# Patient Record
Sex: Female | Born: 1945
Health system: Southern US, Community
[De-identification: ages and names within clinical notes are randomized; demographics above are authoritative.]

## PROBLEM LIST (undated history)

## (undated) DIAGNOSIS — R7301 Impaired fasting glucose: Secondary | ICD-10-CM

## (undated) DIAGNOSIS — Z9221 Personal history of antineoplastic chemotherapy: Secondary | ICD-10-CM

## (undated) DIAGNOSIS — Z803 Family history of malignant neoplasm of breast: Secondary | ICD-10-CM

## (undated) DIAGNOSIS — Z807 Family history of other malignant neoplasms of lymphoid, hematopoietic and related tissues: Secondary | ICD-10-CM

## (undated) DIAGNOSIS — I10 Essential (primary) hypertension: Secondary | ICD-10-CM

## (undated) DIAGNOSIS — Z808 Family history of malignant neoplasm of other organs or systems: Secondary | ICD-10-CM

## (undated) DIAGNOSIS — Z801 Family history of malignant neoplasm of trachea, bronchus and lung: Secondary | ICD-10-CM

## (undated) DIAGNOSIS — K219 Gastro-esophageal reflux disease without esophagitis: Secondary | ICD-10-CM

## (undated) DIAGNOSIS — E039 Hypothyroidism, unspecified: Secondary | ICD-10-CM

## (undated) DIAGNOSIS — E559 Vitamin D deficiency, unspecified: Secondary | ICD-10-CM

## (undated) DIAGNOSIS — L659 Nonscarring hair loss, unspecified: Secondary | ICD-10-CM

## (undated) DIAGNOSIS — C50919 Malignant neoplasm of unspecified site of unspecified female breast: Secondary | ICD-10-CM

## (undated) DIAGNOSIS — E782 Mixed hyperlipidemia: Secondary | ICD-10-CM

## (undated) DIAGNOSIS — Z800 Family history of malignant neoplasm of digestive organs: Secondary | ICD-10-CM

## (undated) DIAGNOSIS — Z923 Personal history of irradiation: Secondary | ICD-10-CM

## (undated) DIAGNOSIS — M5090 Cervical disc disorder, unspecified, unspecified cervical region: Secondary | ICD-10-CM

## (undated) HISTORY — DX: Vitamin D deficiency, unspecified: E55.9

## (undated) HISTORY — DX: Malignant neoplasm of unspecified site of unspecified female breast: C50.919

## (undated) HISTORY — DX: Family history of other malignant neoplasms of lymphoid, hematopoietic and related tissues: Z80.7

## (undated) HISTORY — DX: Cervical disc disorder, unspecified, unspecified cervical region: M50.90

## (undated) HISTORY — DX: Mixed hyperlipidemia: E78.2

## (undated) HISTORY — PX: OTHER SURGICAL HISTORY: SHX169

## (undated) HISTORY — DX: Family history of malignant neoplasm of breast: Z80.3

## (undated) HISTORY — DX: Family history of malignant neoplasm of trachea, bronchus and lung: Z80.1

## (undated) HISTORY — PX: RETINAL TEAR REPAIR CRYOTHERAPY: SHX5304

## (undated) HISTORY — DX: Nonscarring hair loss, unspecified: L65.9

## (undated) HISTORY — DX: Impaired fasting glucose: R73.01

## (undated) HISTORY — DX: Family history of malignant neoplasm of other organs or systems: Z80.8

## (undated) HISTORY — DX: Essential (primary) hypertension: I10

## (undated) HISTORY — PX: THYROIDECTOMY: SHX17

## (undated) HISTORY — DX: Family history of malignant neoplasm of digestive organs: Z80.0

---

## 1983-05-20 HISTORY — PX: BACK SURGERY: SHX140

## 2000-11-16 ENCOUNTER — Other Ambulatory Visit: Admission: RE | Admit: 2000-11-16 | Discharge: 2000-11-16 | Payer: Self-pay | Admitting: Obstetrics and Gynecology

## 2001-06-15 ENCOUNTER — Ambulatory Visit (HOSPITAL_COMMUNITY): Admission: RE | Admit: 2001-06-15 | Discharge: 2001-06-15 | Payer: Self-pay | Admitting: Pulmonary Disease

## 2001-12-15 ENCOUNTER — Other Ambulatory Visit: Admission: RE | Admit: 2001-12-15 | Discharge: 2001-12-15 | Payer: Self-pay | Admitting: Obstetrics and Gynecology

## 2002-06-29 ENCOUNTER — Ambulatory Visit (HOSPITAL_COMMUNITY): Admission: RE | Admit: 2002-06-29 | Discharge: 2002-06-29 | Payer: Self-pay | Admitting: Pulmonary Disease

## 2003-02-06 ENCOUNTER — Other Ambulatory Visit: Admission: RE | Admit: 2003-02-06 | Discharge: 2003-02-06 | Payer: Self-pay | Admitting: Obstetrics and Gynecology

## 2003-03-20 ENCOUNTER — Encounter (INDEPENDENT_AMBULATORY_CARE_PROVIDER_SITE_OTHER): Payer: Self-pay | Admitting: Specialist

## 2003-03-20 ENCOUNTER — Ambulatory Visit (HOSPITAL_COMMUNITY): Admission: RE | Admit: 2003-03-20 | Discharge: 2003-03-20 | Payer: Self-pay | Admitting: Urology

## 2003-03-20 ENCOUNTER — Ambulatory Visit (HOSPITAL_BASED_OUTPATIENT_CLINIC_OR_DEPARTMENT_OTHER): Admission: RE | Admit: 2003-03-20 | Discharge: 2003-03-20 | Payer: Self-pay | Admitting: Urology

## 2003-07-03 ENCOUNTER — Ambulatory Visit (HOSPITAL_COMMUNITY): Admission: RE | Admit: 2003-07-03 | Discharge: 2003-07-03 | Payer: Self-pay | Admitting: Internal Medicine

## 2004-01-23 ENCOUNTER — Emergency Department (HOSPITAL_COMMUNITY): Admission: EM | Admit: 2004-01-23 | Discharge: 2004-01-24 | Payer: Self-pay | Admitting: Emergency Medicine

## 2004-01-26 ENCOUNTER — Ambulatory Visit (HOSPITAL_COMMUNITY): Admission: RE | Admit: 2004-01-26 | Discharge: 2004-01-26 | Payer: Self-pay | Admitting: Internal Medicine

## 2004-08-13 ENCOUNTER — Ambulatory Visit (HOSPITAL_COMMUNITY): Admission: RE | Admit: 2004-08-13 | Discharge: 2004-08-13 | Payer: Self-pay | Admitting: Internal Medicine

## 2004-11-12 ENCOUNTER — Ambulatory Visit (HOSPITAL_COMMUNITY): Admission: RE | Admit: 2004-11-12 | Discharge: 2004-11-12 | Payer: Self-pay | Admitting: Internal Medicine

## 2004-11-22 ENCOUNTER — Ambulatory Visit (HOSPITAL_COMMUNITY): Admission: RE | Admit: 2004-11-22 | Discharge: 2004-11-22 | Payer: Self-pay | Admitting: Internal Medicine

## 2004-11-22 ENCOUNTER — Ambulatory Visit: Payer: Self-pay | Admitting: Internal Medicine

## 2005-08-15 ENCOUNTER — Ambulatory Visit (HOSPITAL_COMMUNITY): Admission: RE | Admit: 2005-08-15 | Discharge: 2005-08-15 | Payer: Self-pay | Admitting: Obstetrics and Gynecology

## 2006-08-18 ENCOUNTER — Ambulatory Visit (HOSPITAL_COMMUNITY): Admission: RE | Admit: 2006-08-18 | Discharge: 2006-08-18 | Payer: Self-pay | Admitting: Internal Medicine

## 2006-09-29 ENCOUNTER — Ambulatory Visit (HOSPITAL_COMMUNITY): Admission: RE | Admit: 2006-09-29 | Discharge: 2006-09-29 | Payer: Self-pay | Admitting: Ophthalmology

## 2006-11-10 ENCOUNTER — Ambulatory Visit (HOSPITAL_COMMUNITY): Admission: RE | Admit: 2006-11-10 | Discharge: 2006-11-10 | Payer: Self-pay | Admitting: Obstetrics and Gynecology

## 2007-04-28 ENCOUNTER — Ambulatory Visit (HOSPITAL_COMMUNITY): Admission: RE | Admit: 2007-04-28 | Discharge: 2007-04-28 | Payer: Self-pay | Admitting: Internal Medicine

## 2007-08-19 ENCOUNTER — Ambulatory Visit (HOSPITAL_COMMUNITY): Admission: RE | Admit: 2007-08-19 | Discharge: 2007-08-19 | Payer: Self-pay | Admitting: Internal Medicine

## 2008-03-31 ENCOUNTER — Ambulatory Visit (HOSPITAL_COMMUNITY): Admission: RE | Admit: 2008-03-31 | Discharge: 2008-03-31 | Payer: Self-pay | Admitting: Internal Medicine

## 2008-08-21 ENCOUNTER — Ambulatory Visit (HOSPITAL_COMMUNITY): Admission: RE | Admit: 2008-08-21 | Discharge: 2008-08-21 | Payer: Self-pay | Admitting: Internal Medicine

## 2009-08-23 ENCOUNTER — Ambulatory Visit (HOSPITAL_COMMUNITY): Admission: RE | Admit: 2009-08-23 | Discharge: 2009-08-23 | Payer: Self-pay | Admitting: Internal Medicine

## 2009-10-31 ENCOUNTER — Ambulatory Visit (HOSPITAL_COMMUNITY): Admission: RE | Admit: 2009-10-31 | Discharge: 2009-10-31 | Payer: Self-pay | Admitting: Internal Medicine

## 2009-11-13 ENCOUNTER — Encounter: Admission: RE | Admit: 2009-11-13 | Discharge: 2009-11-13 | Payer: Self-pay | Admitting: Neurosurgery

## 2010-06-08 ENCOUNTER — Encounter: Payer: Self-pay | Admitting: Internal Medicine

## 2010-07-30 ENCOUNTER — Other Ambulatory Visit (HOSPITAL_COMMUNITY): Payer: Self-pay | Admitting: Internal Medicine

## 2010-07-30 DIAGNOSIS — Z139 Encounter for screening, unspecified: Secondary | ICD-10-CM

## 2010-08-26 ENCOUNTER — Ambulatory Visit (HOSPITAL_COMMUNITY)
Admission: RE | Admit: 2010-08-26 | Discharge: 2010-08-26 | Disposition: A | Discharge status: Home / Self Care | Payer: Medicare Other | Source: Ambulatory Visit | Attending: Internal Medicine | Admitting: Internal Medicine

## 2010-08-26 DIAGNOSIS — Z139 Encounter for screening, unspecified: Secondary | ICD-10-CM

## 2010-08-26 DIAGNOSIS — Z1231 Encounter for screening mammogram for malignant neoplasm of breast: Secondary | ICD-10-CM | POA: Insufficient documentation

## 2010-08-28 ENCOUNTER — Ambulatory Visit
Admission: RE | Admit: 2010-08-28 | Discharge: 2010-08-28 | Disposition: A | Discharge status: Home / Self Care | Payer: Medicare Other | Source: Ambulatory Visit | Attending: Internal Medicine | Admitting: Internal Medicine

## 2010-08-28 ENCOUNTER — Other Ambulatory Visit (HOSPITAL_COMMUNITY): Payer: Self-pay | Admitting: Internal Medicine

## 2010-08-28 DIAGNOSIS — R921 Mammographic calcification found on diagnostic imaging of breast: Secondary | ICD-10-CM

## 2010-10-04 NOTE — Op Note (Signed)
NAME:  Carol Byrd, Carol Byrd                       ACCOUNT NO.:  [id number]   MEDICAL RECORD NO.:  [id number]                   PATIENT TYPE:  AMB   LOCATION:  [facility]                                 FACILITY:  [facility]   PHYSICIAN:  Sigmund I. Patsi Sears, M.D.         DATE OF BIRTH:  1945/11/22   DATE OF PROCEDURE:  03/20/2003  DATE OF DISCHARGE:                                 OPERATIVE REPORT   PREOPERATIVE DIAGNOSIS:  Stress urinary incontinence.   POSTOPERATIVE DIAGNOSIS:  Stress urinary incontinence.   OPERATION:  Transobturator pubovaginal sling.   SURGEON:  Sigmund I. Patsi Sears, M.D.   ANESTHESIA:  General LMA.   PREPARATION:  After appropriate preanesthesia, the patient is brought to the  operating room and placed on the operating table in a dorsal supine  position, where general LMA anesthesia was introduced.  She was then  replaced in the dorsal lithotomy position, where the pubis was prepped with  Betadine solution and draped in the usual fashion.   PROCEDURE:  With the patient in the lithotomy position, inspection reveals  that the patient had reasonable posterior support of her pelvic structures.  It was elected therefore to place a transobturator pubovaginal sling for  repair of her stress urinary incontinence.  Marcaine 0.25% plain 10 mL was  injected into the transvaginal pubocervical arch tissue, and a 1.5 cm  incision was made in the midline over the urethra.  Subcutaneous tissue was  then dissected bilaterally, and with sharp and blunt dissection the  transobturator space was identified.  A stab wound was made over the  obturator bone and using the transobturator tape instrumentation, the tape  was placed bilaterally.  Cystoscopy revealed no evidence of tape in the  bladder.  Cystoscopy did, however, reveal an abnormal area of the bladder,  which was felt to probably be an area of catheter irritation.  This was  biopsied as a cold cup biopsy to be sure that it was  not an endometrioma.  This area was cauterized as well.  Following this the bladder was again  drained with a Foley catheter.  The wings of the transobturator tape were  cut with a right angle clamp behind the tape to ensure that the tape would  not be too tight.  Following this, the wound was closed in two layers with 2-  0 Vicryl suture.  A Foley catheter and packing were placed, and the patient  was awakened and taken to the recovery room in good condition.  She had a  B&O suppository, given IV Toradol, and covered with antibiotic.  The packing  and Foley catheter will be removed before she goes home.                                               Sigmund I. Patsi Sears, M.D.  SIT/MEDQ  D:  03/20/2003  T:  03/20/2003  Job:  [id number]

## 2010-10-04 NOTE — Op Note (Signed)
NAME:  Carol Byrd, Carol Byrd             ACCOUNT NO.:  [id number]   MEDICAL RECORD NO.:  [id number]          PATIENT TYPE:  AMB   LOCATION:  DAY                           FACILITY:  [facility]   PHYSICIAN:  Lionel December, M.D.    DATE OF BIRTH:  [birth year]   DATE OF PROCEDURE:  11/22/2004  DATE OF DISCHARGE:                                 OPERATIVE REPORT   PROCEDURE:  Colonoscopy.   INDICATIONS:  [name] is 65 year old Caucasian female who is here for screening  colonoscopy. Family history is negative for colorectal carcinoma. Procedures  were reviewed the patient, informed consent was obtained.   PREMEDICATION:  Demerol 50 milligrams IV, Versed 7 milligrams IV in divided  dose.   FINDINGS:  Procedure performed in endoscopy suite. The patient's vital signs  and O2 sat were monitored during procedure and remained stable. The patient  was placed left lateral position and rectal examination performed. No  abnormality noted on external or digital exam. Olympus videoscope was placed  rectum, advanced under vision into sigmoid colon beyond. Preparation was  satisfactory. Redundant colon. Scope was slowly and carefully advanced  finally into the cecum. Cecal landmarks i.e. appendiceal orifice, ileocecal  valve well seen. Pictures taken for the record. Scattered diverticula noted  throughout the colon. There were no polyps and/or tumor masses. As the scope  was withdrawn, colonic mucosa was examined for the second time and there no  polyps and/or tumor masses. Rectal mucosa similarly was normal. Scope was  retroflexed to examine anorectal junction which was unremarkable. Endoscope  was straightened, withdrawn. The patient tolerated the procedure well.   FINAL DIAGNOSIS:  Pan colonic diverticulosis. She has few scattered  diverticula scattered throughout the colon.   RECOMMENDATIONS:  High-fiber diet. Citrucel or equivalent one tablespoonful  q.d.  She should continue yearly Hemoccults and consider  next reexamine in  10 years from now.       NR/MEDQ  D:  11/22/2004  T:  11/22/2004  Job:  [id number]   cc:   Kingsley Callander. Ouida Sills, MD  [address]  [place]  [place] [address]  Fax: [phone]

## 2011-03-12 ENCOUNTER — Ambulatory Visit (HOSPITAL_COMMUNITY)
Admission: RE | Admit: 2011-03-12 | Discharge: 2011-03-12 | Disposition: A | Discharge status: Home / Self Care | Payer: Medicare Other | Source: Ambulatory Visit | Attending: Internal Medicine | Admitting: Internal Medicine

## 2011-03-12 ENCOUNTER — Other Ambulatory Visit (HOSPITAL_COMMUNITY): Payer: Self-pay | Admitting: Internal Medicine

## 2011-03-12 DIAGNOSIS — M25579 Pain in unspecified ankle and joints of unspecified foot: Secondary | ICD-10-CM | POA: Insufficient documentation

## 2011-03-12 DIAGNOSIS — X500XXA Overexertion from strenuous movement or load, initial encounter: Secondary | ICD-10-CM | POA: Insufficient documentation

## 2011-03-12 DIAGNOSIS — S92309A Fracture of unspecified metatarsal bone(s), unspecified foot, initial encounter for closed fracture: Secondary | ICD-10-CM | POA: Insufficient documentation

## 2011-03-12 DIAGNOSIS — W19XXXA Unspecified fall, initial encounter: Secondary | ICD-10-CM

## 2011-03-13 ENCOUNTER — Ambulatory Visit (INDEPENDENT_AMBULATORY_CARE_PROVIDER_SITE_OTHER): Payer: Medicare Other | Admitting: Orthopedic Surgery

## 2011-03-13 ENCOUNTER — Encounter: Payer: Self-pay | Admitting: Orthopedic Surgery

## 2011-03-13 VITALS — BP 100/62 | Ht 67.0 in | Wt 170.0 lb

## 2011-03-13 DIAGNOSIS — S92309A Fracture of unspecified metatarsal bone(s), unspecified foot, initial encounter for closed fracture: Secondary | ICD-10-CM

## 2011-03-13 HISTORY — DX: Fracture of unspecified metatarsal bone(s), unspecified foot, initial encounter for closed fracture: S92.309A

## 2011-03-13 NOTE — Patient Instructions (Signed)
Shoe x 8 weeks

## 2011-03-13 NOTE — Progress Notes (Signed)
Pain left foot  Ref by Dr Jerene Pitch on deck c/o left foot pain and right knee pain. X Rays ankle : fracture foot-will need foot x rays   Review of systems are negative, except for easy bruising.  Past Surgical History  Procedure Date  . Thyroidectomy   . Cataracts   . Retinal tear repair cryotherapy   . Retinal peel    Past Medical History  Diagnosis Date  . High blood pressure    Physical Exam(12) GENERAL: normal development   CDV: pulses are normal   Skin: normal  Lymph: nodes were not palpable/normal  Psychiatric: awake, alert and oriented  Neuro: normal sensation  MSK walking in regular shoes she does have a limp  1 There is swelling and tenderness in the 5th metatarsal, the ankle joint is stable with normal range of motion. There is no deformity. Muscle tone is normal. Strength and stability are normal in the RIGHT knee. Swelling is noted in the RIGHT knee. Tenderness is noted over the patella.   2 views, LEFT foot, secondary to injury This is not a true avulsion fracture is more of a Jones fracture with a transverse fracture more distal on the bone, nondisplaced.  Impression Jones-type fracture, 5th metatarsal, LEFT foot Assessment: Jones fracture, LEFT 5th metatarsal    Plan: Hard sole shoe, patient advised fracture, takes 8 weeks to heal

## 2011-05-05 ENCOUNTER — Ambulatory Visit (INDEPENDENT_AMBULATORY_CARE_PROVIDER_SITE_OTHER): Payer: Medicare Other | Admitting: Orthopedic Surgery

## 2011-05-05 ENCOUNTER — Encounter: Payer: Self-pay | Admitting: Orthopedic Surgery

## 2011-05-05 VITALS — BP 110/70 | Ht 67.0 in | Wt 173.0 lb

## 2011-05-05 DIAGNOSIS — S92309A Fracture of unspecified metatarsal bone(s), unspecified foot, initial encounter for closed fracture: Secondary | ICD-10-CM

## 2011-05-05 NOTE — Patient Instructions (Signed)
Normal activity    

## 2011-05-05 NOTE — Progress Notes (Signed)
Subjective:     Patient ID: Carol Byrd, female   DOB: 1946-05-12, 65 y.o.   MRN: [id number]  HPI  Right 5th MMT fracture   8 weeks ago  Rx hardsole shoe   C/O stinging     Review of Systems  Negative other msk     Objective:   Physical Exam A good alignment normal, no fracture site tenderness  X-ray shows fibrous union non-displaced fifth metatarsal fracture at the base    Assessment:     Fifth metatarsal fracture with fibrous union    Plan:     Resume normal shoe wear  X-ray 3 views RIGHT foot reason for x-ray fracture RIGHT foot  Diagnosis fifth metatarsal fracture  At the base of the fifth metatarsal in the area of the Jones injury there is a fracture transverse with no displacement and apparent fibrous union at 8 weeks  Impression fibrous union fifth metatarsal fracture RIGHT foot

## 2011-05-08 ENCOUNTER — Ambulatory Visit: Payer: Medicare Other | Admitting: Orthopedic Surgery

## 2011-07-10 DIAGNOSIS — L570 Actinic keratosis: Secondary | ICD-10-CM | POA: Diagnosis not present

## 2011-07-10 DIAGNOSIS — L821 Other seborrheic keratosis: Secondary | ICD-10-CM | POA: Diagnosis not present

## 2011-07-10 DIAGNOSIS — L659 Nonscarring hair loss, unspecified: Secondary | ICD-10-CM | POA: Diagnosis not present

## 2011-07-10 DIAGNOSIS — D239 Other benign neoplasm of skin, unspecified: Secondary | ICD-10-CM | POA: Diagnosis not present

## 2011-07-31 DIAGNOSIS — H35379 Puckering of macula, unspecified eye: Secondary | ICD-10-CM | POA: Diagnosis not present

## 2011-07-31 DIAGNOSIS — H33009 Unspecified retinal detachment with retinal break, unspecified eye: Secondary | ICD-10-CM | POA: Diagnosis not present

## 2011-07-31 DIAGNOSIS — Z961 Presence of intraocular lens: Secondary | ICD-10-CM | POA: Diagnosis not present

## 2011-08-04 ENCOUNTER — Other Ambulatory Visit (HOSPITAL_COMMUNITY): Payer: Self-pay | Admitting: Internal Medicine

## 2011-08-04 DIAGNOSIS — R921 Mammographic calcification found on diagnostic imaging of breast: Secondary | ICD-10-CM

## 2011-09-01 ENCOUNTER — Ambulatory Visit (HOSPITAL_COMMUNITY)
Admission: RE | Admit: 2011-09-01 | Discharge: 2011-09-01 | Disposition: A | Discharge status: Home / Self Care | Payer: Medicare Other | Source: Ambulatory Visit | Attending: Internal Medicine | Admitting: Internal Medicine

## 2011-09-01 DIAGNOSIS — R921 Mammographic calcification found on diagnostic imaging of breast: Secondary | ICD-10-CM

## 2011-09-01 DIAGNOSIS — Z1231 Encounter for screening mammogram for malignant neoplasm of breast: Secondary | ICD-10-CM | POA: Diagnosis not present

## 2011-09-01 DIAGNOSIS — R928 Other abnormal and inconclusive findings on diagnostic imaging of breast: Secondary | ICD-10-CM | POA: Insufficient documentation

## 2011-10-30 DIAGNOSIS — Z79899 Other long term (current) drug therapy: Secondary | ICD-10-CM | POA: Diagnosis not present

## 2011-10-30 DIAGNOSIS — I10 Essential (primary) hypertension: Secondary | ICD-10-CM | POA: Diagnosis not present

## 2011-10-30 DIAGNOSIS — E039 Hypothyroidism, unspecified: Secondary | ICD-10-CM | POA: Diagnosis not present

## 2011-10-30 DIAGNOSIS — E785 Hyperlipidemia, unspecified: Secondary | ICD-10-CM | POA: Diagnosis not present

## 2011-11-06 DIAGNOSIS — E039 Hypothyroidism, unspecified: Secondary | ICD-10-CM | POA: Diagnosis not present

## 2011-11-06 DIAGNOSIS — I10 Essential (primary) hypertension: Secondary | ICD-10-CM | POA: Diagnosis not present

## 2011-11-06 DIAGNOSIS — E785 Hyperlipidemia, unspecified: Secondary | ICD-10-CM | POA: Diagnosis not present

## 2011-11-13 DIAGNOSIS — Z01419 Encounter for gynecological examination (general) (routine) without abnormal findings: Secondary | ICD-10-CM | POA: Diagnosis not present

## 2011-11-13 DIAGNOSIS — Z124 Encounter for screening for malignant neoplasm of cervix: Secondary | ICD-10-CM | POA: Diagnosis not present

## 2011-11-18 ENCOUNTER — Other Ambulatory Visit (HOSPITAL_COMMUNITY): Payer: Self-pay | Admitting: Obstetrics and Gynecology

## 2011-11-18 DIAGNOSIS — E039 Hypothyroidism, unspecified: Secondary | ICD-10-CM

## 2011-12-22 DIAGNOSIS — N3941 Urge incontinence: Secondary | ICD-10-CM | POA: Diagnosis not present

## 2011-12-22 DIAGNOSIS — N393 Stress incontinence (female) (male): Secondary | ICD-10-CM | POA: Diagnosis not present

## 2011-12-23 ENCOUNTER — Ambulatory Visit (HOSPITAL_COMMUNITY)
Admission: RE | Admit: 2011-12-23 | Discharge: 2011-12-23 | Disposition: A | Discharge status: Home / Self Care | Payer: Medicare Other | Source: Ambulatory Visit | Attending: Obstetrics and Gynecology | Admitting: Obstetrics and Gynecology

## 2011-12-23 DIAGNOSIS — Z780 Asymptomatic menopausal state: Secondary | ICD-10-CM | POA: Insufficient documentation

## 2011-12-23 DIAGNOSIS — Z13820 Encounter for screening for osteoporosis: Secondary | ICD-10-CM | POA: Diagnosis not present

## 2011-12-23 DIAGNOSIS — E039 Hypothyroidism, unspecified: Secondary | ICD-10-CM | POA: Insufficient documentation

## 2011-12-24 DIAGNOSIS — N3941 Urge incontinence: Secondary | ICD-10-CM | POA: Diagnosis not present

## 2011-12-24 DIAGNOSIS — N393 Stress incontinence (female) (male): Secondary | ICD-10-CM | POA: Diagnosis not present

## 2012-01-06 DIAGNOSIS — N3941 Urge incontinence: Secondary | ICD-10-CM | POA: Diagnosis not present

## 2012-01-06 DIAGNOSIS — N393 Stress incontinence (female) (male): Secondary | ICD-10-CM | POA: Diagnosis not present

## 2012-01-13 DIAGNOSIS — Z961 Presence of intraocular lens: Secondary | ICD-10-CM | POA: Diagnosis not present

## 2012-01-13 DIAGNOSIS — H35379 Puckering of macula, unspecified eye: Secondary | ICD-10-CM | POA: Diagnosis not present

## 2012-02-05 DIAGNOSIS — N393 Stress incontinence (female) (male): Secondary | ICD-10-CM | POA: Diagnosis not present

## 2012-02-05 DIAGNOSIS — N3941 Urge incontinence: Secondary | ICD-10-CM | POA: Diagnosis not present

## 2012-02-05 DIAGNOSIS — R279 Unspecified lack of coordination: Secondary | ICD-10-CM | POA: Diagnosis not present

## 2012-02-05 DIAGNOSIS — M6281 Muscle weakness (generalized): Secondary | ICD-10-CM | POA: Diagnosis not present

## 2012-02-12 DIAGNOSIS — R279 Unspecified lack of coordination: Secondary | ICD-10-CM | POA: Diagnosis not present

## 2012-02-12 DIAGNOSIS — M6281 Muscle weakness (generalized): Secondary | ICD-10-CM | POA: Diagnosis not present

## 2012-02-12 DIAGNOSIS — N3941 Urge incontinence: Secondary | ICD-10-CM | POA: Diagnosis not present

## 2012-02-12 DIAGNOSIS — N393 Stress incontinence (female) (male): Secondary | ICD-10-CM | POA: Diagnosis not present

## 2012-02-17 DIAGNOSIS — N393 Stress incontinence (female) (male): Secondary | ICD-10-CM | POA: Diagnosis not present

## 2012-02-17 DIAGNOSIS — M6281 Muscle weakness (generalized): Secondary | ICD-10-CM | POA: Diagnosis not present

## 2012-02-17 DIAGNOSIS — R279 Unspecified lack of coordination: Secondary | ICD-10-CM | POA: Diagnosis not present

## 2012-02-17 DIAGNOSIS — N3941 Urge incontinence: Secondary | ICD-10-CM | POA: Diagnosis not present

## 2012-02-24 DIAGNOSIS — R279 Unspecified lack of coordination: Secondary | ICD-10-CM | POA: Diagnosis not present

## 2012-02-24 DIAGNOSIS — N3941 Urge incontinence: Secondary | ICD-10-CM | POA: Diagnosis not present

## 2012-02-24 DIAGNOSIS — N393 Stress incontinence (female) (male): Secondary | ICD-10-CM | POA: Diagnosis not present

## 2012-02-24 DIAGNOSIS — M6281 Muscle weakness (generalized): Secondary | ICD-10-CM | POA: Diagnosis not present

## 2012-03-11 DIAGNOSIS — Z23 Encounter for immunization: Secondary | ICD-10-CM | POA: Diagnosis not present

## 2012-03-29 DIAGNOSIS — M545 Low back pain: Secondary | ICD-10-CM | POA: Diagnosis not present

## 2012-05-04 DIAGNOSIS — I10 Essential (primary) hypertension: Secondary | ICD-10-CM | POA: Diagnosis not present

## 2012-05-27 DIAGNOSIS — J019 Acute sinusitis, unspecified: Secondary | ICD-10-CM | POA: Diagnosis not present

## 2012-07-15 DIAGNOSIS — L659 Nonscarring hair loss, unspecified: Secondary | ICD-10-CM | POA: Diagnosis not present

## 2012-07-15 DIAGNOSIS — D1801 Hemangioma of skin and subcutaneous tissue: Secondary | ICD-10-CM | POA: Diagnosis not present

## 2012-07-15 DIAGNOSIS — L821 Other seborrheic keratosis: Secondary | ICD-10-CM | POA: Diagnosis not present

## 2012-07-15 DIAGNOSIS — Z85828 Personal history of other malignant neoplasm of skin: Secondary | ICD-10-CM | POA: Diagnosis not present

## 2012-08-02 ENCOUNTER — Other Ambulatory Visit (HOSPITAL_COMMUNITY): Payer: Self-pay | Admitting: Internal Medicine

## 2012-08-02 DIAGNOSIS — Z139 Encounter for screening, unspecified: Secondary | ICD-10-CM

## 2012-08-19 DIAGNOSIS — Z961 Presence of intraocular lens: Secondary | ICD-10-CM | POA: Diagnosis not present

## 2012-08-19 DIAGNOSIS — H35379 Puckering of macula, unspecified eye: Secondary | ICD-10-CM | POA: Diagnosis not present

## 2012-08-19 DIAGNOSIS — H33009 Unspecified retinal detachment with retinal break, unspecified eye: Secondary | ICD-10-CM | POA: Diagnosis not present

## 2012-09-02 ENCOUNTER — Ambulatory Visit (HOSPITAL_COMMUNITY)
Admission: RE | Admit: 2012-09-02 | Discharge: 2012-09-02 | Disposition: A | Discharge status: Home / Self Care | Payer: Medicare Other | Source: Ambulatory Visit | Attending: Internal Medicine | Admitting: Internal Medicine

## 2012-09-02 DIAGNOSIS — Z1231 Encounter for screening mammogram for malignant neoplasm of breast: Secondary | ICD-10-CM | POA: Insufficient documentation

## 2012-09-02 DIAGNOSIS — Z139 Encounter for screening, unspecified: Secondary | ICD-10-CM

## 2012-10-01 DIAGNOSIS — I10 Essential (primary) hypertension: Secondary | ICD-10-CM | POA: Diagnosis not present

## 2012-10-22 ENCOUNTER — Ambulatory Visit (INDEPENDENT_AMBULATORY_CARE_PROVIDER_SITE_OTHER): Payer: Medicare Other | Admitting: Diagnostic Neuroimaging

## 2012-10-22 ENCOUNTER — Encounter: Payer: Self-pay | Admitting: Diagnostic Neuroimaging

## 2012-10-22 VITALS — BP 151/70 | HR 82 | Temp 96.7°F | Ht 67.5 in | Wt 170.0 lb

## 2012-10-22 DIAGNOSIS — R251 Tremor, unspecified: Secondary | ICD-10-CM

## 2012-10-22 DIAGNOSIS — R259 Unspecified abnormal involuntary movements: Secondary | ICD-10-CM | POA: Diagnosis not present

## 2012-10-22 NOTE — Progress Notes (Signed)
[facility]  PATIENT: Carol Byrd DOB: 1946-03-05  REFERRING CLINICIAN: R Fagan HISTORY FROM: patient and husband REASON FOR VISIT: new consult   HISTORICAL  CHIEF COMPLAINT:  Chief Complaint  Patient presents with  . Tremors    Np.#6    HISTORY OF PRESENT ILLNESS:   67 year old right-handed female with history of Graves' disease status post thyroidectomy, hypertension, hypercholesterolemia, here for evaluation of left thumb tremor for past 3-4 months.  Patient notes tremor, jittery shaking movements in her left thumb when she flexes it into a certain position. She also is noted left thumb getting "stuck" if it is flexed. Patient has family history of Parkinson's disease in her mother, who was diagnosed at age 46 years old and passed away at age 68 years old. Patient's mother also had dementia. Patient is concerned about similar diagnosis for herself.  No problems in her right arm, legs, balance or walking. She has some trouble with swallowing, especially read or don't products, which are improved if she drinks water. No choking problems with dry crunchy foods or liquids. She has some trouble with insomnia, which is compounded by insomnia and her husband. No change in sense of smell.  REVIEW OF SYSTEMS: Full 14 system review of systems performed and notable only for trouble swallowing incontinence cramps feeling hot and cold insomnia.  ALLERGIES: No Known Allergies  HOME MEDICATIONS: Outpatient Prescriptions Prior to Visit  Medication Sig Dispense Refill  . levothyroxine (SYNTHROID, LEVOTHROID) 112 MCG tablet Take 112 mcg by mouth daily.        . simvastatin (ZOCOR) 40 MG tablet Take 40 mg by mouth at bedtime.        Marland Kitchen telmisartan (MICARDIS) 40 MG tablet Take 40 mg by mouth daily.         No facility-administered medications prior to visit.    PAST MEDICAL HISTORY: Past Medical History  Diagnosis Date  . High blood pressure   . High cholesterol       PAST SURGICAL HISTORY: Past Surgical History  Procedure Laterality Date  . Thyroidectomy    . Cataracts    . Retinal tear repair cryotherapy    . Retinal peel      FAMILY HISTORY: Family History  Problem Relation Age of Onset  . Heart disease    . Cancer    . Breast cancer Sister     SOCIAL HISTORY:  History   Social History  . Marital Status: Married    Spouse Name: N/A    Number of Children: N/A  . Years of Education: N/A   Occupational History  . retired    Social History Main Topics  . Smoking status: Never Smoker   . Smokeless tobacco: Not on file  . Alcohol Use: Yes  . Drug Use: No  . Sexually Active: Not on file   Other Topics Concern  . Not on file   Social History Narrative  . No narrative on file     PHYSICAL EXAM  Filed Vitals:   10/22/12 1136  BP: 151/70  Pulse: 82  Temp: 96.7 F (35.9 C)  TempSrc: Oral  Height: 5' 7.5" (1.715 m)  Weight: 170 lb (77.111 kg)    Not recorded    Body mass index is 26.22 kg/(m^2).  GENERAL EXAM: Patient is in no distress  CARDIOVASCULAR: Regular rate and rhythm, no murmurs, no carotid bruits  NEUROLOGIC: MENTAL STATUS: awake, alert, language fluent, comprehension intact, naming intact; NEG MYERSONS, NEG SNOUT.  CRANIAL NERVE:  no papilledema on fundoscopic exam, pupils equal and reactive to light, LEFT EYE POST-SURGICAL.  visual fields full to confrontation, extraocular muscles intact, no nystagmus, facial sensation and strength symmetric, uvula midline, shoulder shrug symmetric, tongue midline. MOTOR: normal bulk and tone, full strength in the BUE, BLE; NO REST TREMOR. INTERMITTENT POSTURAL TREMOR OF LEFT THUMB WITH 45 DEGREE FLEXION. MILD POSTURAL TREMOR OF BUE. NO RIGIDITY, BRADYKINESIA. SENSORY: normal and symmetric to light touch, pinprick, temperature, vibration COORDINATION: finger-nose-finger, fine finger movements normal REFLEXES: deep tendon reflexes present and symmetric GAIT/STATION:  narrow based gait; able to walk on toes, heels and tandem; romberg is negative   DIAGNOSTIC DATA (LABS, IMAGING, TESTING) - I reviewed patient records, labs, notes, testing and imaging myself where available.  No results found for this basename: WBC, HGB, HCT, MCV, PLT   No results found for this basename: na, k, cl, co2, glucose, bun, creatinine, calcium, prot, albumin, ast, alt, alkphos, bilitot, gfrnonaa, gfraa   No results found for this basename: CHOL, HDL, LDLCALC, LDLDIRECT, TRIG, CHOLHDL   No results found for this basename: HGBA1C   No results found for this basename: VITAMINB12   No results found for this basename: TSH      ASSESSMENT AND PLAN  67 y.o. year old female  has a past medical history of High blood pressure and High cholesterol. here with intermittent postural tremor of the left thumb. I do not see any signs of Parkinson's disease at this time. Tremor may be related to trigger finger and mechanical factors, essential tremor or enhanced physiologic tremor/anxiety.  PLAN: 1. Observation    Suanne Marker, MD 10/22/2012, 12:11 PM Certified in Neurology, Neurophysiology and Neuroimaging  [facility] [address], [address] [place], [place] [address] [phone]

## 2012-10-22 NOTE — Patient Instructions (Signed)
Observation. Follow up in 6-12 months.

## 2012-10-29 DIAGNOSIS — E785 Hyperlipidemia, unspecified: Secondary | ICD-10-CM | POA: Diagnosis not present

## 2012-10-29 DIAGNOSIS — R55 Syncope and collapse: Secondary | ICD-10-CM | POA: Diagnosis not present

## 2012-11-26 DIAGNOSIS — Z79899 Other long term (current) drug therapy: Secondary | ICD-10-CM | POA: Diagnosis not present

## 2012-11-26 DIAGNOSIS — I10 Essential (primary) hypertension: Secondary | ICD-10-CM | POA: Diagnosis not present

## 2012-11-26 DIAGNOSIS — R7301 Impaired fasting glucose: Secondary | ICD-10-CM | POA: Diagnosis not present

## 2012-11-26 DIAGNOSIS — E039 Hypothyroidism, unspecified: Secondary | ICD-10-CM | POA: Diagnosis not present

## 2012-11-26 DIAGNOSIS — E785 Hyperlipidemia, unspecified: Secondary | ICD-10-CM | POA: Diagnosis not present

## 2012-11-29 DIAGNOSIS — L659 Nonscarring hair loss, unspecified: Secondary | ICD-10-CM | POA: Diagnosis not present

## 2012-12-09 DIAGNOSIS — E039 Hypothyroidism, unspecified: Secondary | ICD-10-CM | POA: Diagnosis not present

## 2012-12-09 DIAGNOSIS — E785 Hyperlipidemia, unspecified: Secondary | ICD-10-CM | POA: Diagnosis not present

## 2012-12-09 DIAGNOSIS — I10 Essential (primary) hypertension: Secondary | ICD-10-CM | POA: Diagnosis not present

## 2012-12-16 DIAGNOSIS — Z01419 Encounter for gynecological examination (general) (routine) without abnormal findings: Secondary | ICD-10-CM | POA: Diagnosis not present

## 2012-12-16 DIAGNOSIS — Z124 Encounter for screening for malignant neoplasm of cervix: Secondary | ICD-10-CM | POA: Diagnosis not present

## 2012-12-28 DIAGNOSIS — R05 Cough: Secondary | ICD-10-CM | POA: Diagnosis not present

## 2012-12-29 ENCOUNTER — Ambulatory Visit (HOSPITAL_COMMUNITY)
Admission: RE | Admit: 2012-12-29 | Discharge: 2012-12-29 | Disposition: A | Discharge status: Home / Self Care | Payer: Medicare Other | Source: Ambulatory Visit | Attending: Internal Medicine | Admitting: Internal Medicine

## 2012-12-29 ENCOUNTER — Other Ambulatory Visit (HOSPITAL_COMMUNITY): Payer: Self-pay | Admitting: Internal Medicine

## 2012-12-29 DIAGNOSIS — R05 Cough: Secondary | ICD-10-CM | POA: Diagnosis not present

## 2012-12-29 DIAGNOSIS — J3489 Other specified disorders of nose and nasal sinuses: Secondary | ICD-10-CM | POA: Insufficient documentation

## 2012-12-29 DIAGNOSIS — R059 Cough, unspecified: Secondary | ICD-10-CM | POA: Insufficient documentation

## 2013-01-18 DIAGNOSIS — H35379 Puckering of macula, unspecified eye: Secondary | ICD-10-CM | POA: Diagnosis not present

## 2013-01-18 DIAGNOSIS — Z961 Presence of intraocular lens: Secondary | ICD-10-CM | POA: Diagnosis not present

## 2013-03-22 DIAGNOSIS — Z79899 Other long term (current) drug therapy: Secondary | ICD-10-CM | POA: Diagnosis not present

## 2013-03-22 DIAGNOSIS — E785 Hyperlipidemia, unspecified: Secondary | ICD-10-CM | POA: Diagnosis not present

## 2013-03-29 DIAGNOSIS — Z23 Encounter for immunization: Secondary | ICD-10-CM | POA: Diagnosis not present

## 2013-03-29 DIAGNOSIS — R05 Cough: Secondary | ICD-10-CM | POA: Diagnosis not present

## 2013-03-29 DIAGNOSIS — E785 Hyperlipidemia, unspecified: Secondary | ICD-10-CM | POA: Diagnosis not present

## 2013-06-07 DIAGNOSIS — K219 Gastro-esophageal reflux disease without esophagitis: Secondary | ICD-10-CM | POA: Diagnosis not present

## 2013-06-22 DIAGNOSIS — L659 Nonscarring hair loss, unspecified: Secondary | ICD-10-CM | POA: Diagnosis not present

## 2013-06-22 DIAGNOSIS — L570 Actinic keratosis: Secondary | ICD-10-CM | POA: Diagnosis not present

## 2013-06-22 DIAGNOSIS — B351 Tinea unguium: Secondary | ICD-10-CM | POA: Diagnosis not present

## 2013-07-25 ENCOUNTER — Other Ambulatory Visit (HOSPITAL_COMMUNITY): Payer: Self-pay | Admitting: Internal Medicine

## 2013-07-25 DIAGNOSIS — Z1231 Encounter for screening mammogram for malignant neoplasm of breast: Secondary | ICD-10-CM

## 2013-08-01 DIAGNOSIS — K219 Gastro-esophageal reflux disease without esophagitis: Secondary | ICD-10-CM | POA: Diagnosis not present

## 2013-08-12 DIAGNOSIS — K219 Gastro-esophageal reflux disease without esophagitis: Secondary | ICD-10-CM | POA: Diagnosis not present

## 2013-08-12 DIAGNOSIS — F458 Other somatoform disorders: Secondary | ICD-10-CM | POA: Diagnosis not present

## 2013-08-12 DIAGNOSIS — R059 Cough, unspecified: Secondary | ICD-10-CM | POA: Diagnosis not present

## 2013-08-12 DIAGNOSIS — R490 Dysphonia: Secondary | ICD-10-CM | POA: Diagnosis not present

## 2013-08-12 DIAGNOSIS — R05 Cough: Secondary | ICD-10-CM | POA: Diagnosis not present

## 2013-08-23 DIAGNOSIS — S058X9A Other injuries of unspecified eye and orbit, initial encounter: Secondary | ICD-10-CM | POA: Diagnosis not present

## 2013-09-05 ENCOUNTER — Ambulatory Visit (HOSPITAL_COMMUNITY)
Admission: RE | Admit: 2013-09-05 | Discharge: 2013-09-05 | Disposition: A | Discharge status: Home / Self Care | Payer: Medicare Other | Source: Ambulatory Visit | Attending: Internal Medicine | Admitting: Internal Medicine

## 2013-09-05 DIAGNOSIS — Z1231 Encounter for screening mammogram for malignant neoplasm of breast: Secondary | ICD-10-CM | POA: Insufficient documentation

## 2013-09-08 DIAGNOSIS — H35379 Puckering of macula, unspecified eye: Secondary | ICD-10-CM | POA: Diagnosis not present

## 2013-09-08 DIAGNOSIS — H33009 Unspecified retinal detachment with retinal break, unspecified eye: Secondary | ICD-10-CM | POA: Diagnosis not present

## 2013-09-08 DIAGNOSIS — Z961 Presence of intraocular lens: Secondary | ICD-10-CM | POA: Diagnosis not present

## 2013-09-09 DIAGNOSIS — S01501A Unspecified open wound of lip, initial encounter: Secondary | ICD-10-CM | POA: Diagnosis not present

## 2013-10-03 DIAGNOSIS — L509 Urticaria, unspecified: Secondary | ICD-10-CM | POA: Diagnosis not present

## 2013-10-04 ENCOUNTER — Ambulatory Visit (INDEPENDENT_AMBULATORY_CARE_PROVIDER_SITE_OTHER): Payer: Medicare Other | Admitting: Orthopedic Surgery

## 2013-10-04 DIAGNOSIS — M7660 Achilles tendinitis, unspecified leg: Secondary | ICD-10-CM | POA: Insufficient documentation

## 2013-10-04 HISTORY — DX: Achilles tendinitis, unspecified leg: M76.60

## 2013-10-04 NOTE — Patient Instructions (Signed)
Ice baths  Open back shoes  Topical cream

## 2013-10-04 NOTE — Progress Notes (Signed)
Patient ID: Carol Byrd, female   DOB: 1945-06-19, 68 y.o.   MRN: [id number]  Chief complaint pain and swelling posterior aspect right heel for 2 months  Patient presents with atraumatic onset of pain and swelling with dull constant discomfort in the posterior aspect of her right heel and difficulty wearing a shoe from the shoe counter. Took some ibuprofen and thought she might develop hives when on prednisone and she is also modified her shoe wear and thinks that things have gotten a little bit better. Denies any trauma.  Previously seen for other orthopedic issues related to her fracture and her other foot Past Medical History  Diagnosis Date  . High blood pressure   . High cholesterol     General appearance is normal, the patient is alert and oriented x3 with normal mood and affect. She walks normally she is wearing an open back shoe she is a swollen tender pump bump is nontender Achilles ankle range of motion normal stability tests were drawer test normal muscle tone and strength normal skin intact, swelling over the pump bump area pulses are good sensation is normal. No abnormal pathologic reflexes such as the Hoffman maneuver toes downgoing  Impression Achilles tendinitis pump bump  Recommend diclofenac Lidoderm menthol cream Ice bath Also recommend wearing open back shoe We'll see her in 6 weeks

## 2013-10-05 DIAGNOSIS — E785 Hyperlipidemia, unspecified: Secondary | ICD-10-CM | POA: Diagnosis not present

## 2013-10-11 DIAGNOSIS — E785 Hyperlipidemia, unspecified: Secondary | ICD-10-CM | POA: Diagnosis not present

## 2013-11-15 ENCOUNTER — Ambulatory Visit: Payer: Medicare Other | Admitting: Orthopedic Surgery

## 2013-11-24 ENCOUNTER — Encounter: Payer: Self-pay | Admitting: Orthopedic Surgery

## 2013-11-24 ENCOUNTER — Ambulatory Visit (INDEPENDENT_AMBULATORY_CARE_PROVIDER_SITE_OTHER): Payer: Medicare Other | Admitting: Orthopedic Surgery

## 2013-11-24 VITALS — BP 127/69 | Ht 67.5 in | Wt 170.0 lb

## 2013-11-24 DIAGNOSIS — M7661 Achilles tendinitis, right leg: Secondary | ICD-10-CM

## 2013-11-24 DIAGNOSIS — M7660 Achilles tendinitis, unspecified leg: Secondary | ICD-10-CM | POA: Diagnosis not present

## 2013-11-24 NOTE — Patient Instructions (Signed)
Continue cream , ice,  open shoes

## 2013-11-24 NOTE — Progress Notes (Signed)
Patient ID: Carol Byrd, female   DOB: [birth year], 68 y.o.   MRN: [id number] Chief Complaint  Patient presents with  . Follow-up    recheck pump bump right foot    Recheck pump bump right foot. Previous treatment topical cream from [place], open back shoe, ice.  Doing very well improving although she has continued swelling and pain if it gets up against anything  Review of systems negative  BP 127/69  Ht 5' 7.5" (1.715 m)  Wt 170 lb (77.111 kg)  BMI 26.22 kg/m2 General appearance is normal, the patient is alert and oriented x3 with normal mood and affect. Large pump bump right foot tender Achilles tendon nontender range of motion stability normal Skin intact mild redness mild warmth  Pump bump continue current treatment followup 6-8 weeks

## 2013-12-13 DIAGNOSIS — E785 Hyperlipidemia, unspecified: Secondary | ICD-10-CM | POA: Diagnosis not present

## 2013-12-13 DIAGNOSIS — I10 Essential (primary) hypertension: Secondary | ICD-10-CM | POA: Diagnosis not present

## 2013-12-13 DIAGNOSIS — Z79899 Other long term (current) drug therapy: Secondary | ICD-10-CM | POA: Diagnosis not present

## 2013-12-13 DIAGNOSIS — E039 Hypothyroidism, unspecified: Secondary | ICD-10-CM | POA: Diagnosis not present

## 2013-12-13 DIAGNOSIS — R7301 Impaired fasting glucose: Secondary | ICD-10-CM | POA: Diagnosis not present

## 2013-12-20 DIAGNOSIS — Z23 Encounter for immunization: Secondary | ICD-10-CM | POA: Diagnosis not present

## 2013-12-20 DIAGNOSIS — Z0000 Encounter for general adult medical examination without abnormal findings: Secondary | ICD-10-CM | POA: Diagnosis not present

## 2014-01-10 ENCOUNTER — Ambulatory Visit: Payer: Medicare Other | Admitting: Orthopedic Surgery

## 2014-02-21 DIAGNOSIS — Z961 Presence of intraocular lens: Secondary | ICD-10-CM | POA: Diagnosis not present

## 2014-02-21 DIAGNOSIS — H35372 Puckering of macula, left eye: Secondary | ICD-10-CM | POA: Diagnosis not present

## 2014-03-13 DIAGNOSIS — I10 Essential (primary) hypertension: Secondary | ICD-10-CM | POA: Diagnosis not present

## 2014-03-13 DIAGNOSIS — G4762 Sleep related leg cramps: Secondary | ICD-10-CM | POA: Diagnosis not present

## 2014-03-13 DIAGNOSIS — Z23 Encounter for immunization: Secondary | ICD-10-CM | POA: Diagnosis not present

## 2014-05-10 DIAGNOSIS — E039 Hypothyroidism, unspecified: Secondary | ICD-10-CM | POA: Diagnosis not present

## 2014-05-10 DIAGNOSIS — E785 Hyperlipidemia, unspecified: Secondary | ICD-10-CM | POA: Diagnosis not present

## 2014-05-18 DIAGNOSIS — E785 Hyperlipidemia, unspecified: Secondary | ICD-10-CM | POA: Diagnosis not present

## 2014-05-18 DIAGNOSIS — E030 Congenital hypothyroidism with diffuse goiter: Secondary | ICD-10-CM | POA: Diagnosis not present

## 2014-07-04 ENCOUNTER — Ambulatory Visit (INDEPENDENT_AMBULATORY_CARE_PROVIDER_SITE_OTHER): Payer: Medicare Other | Admitting: Podiatry

## 2014-07-04 ENCOUNTER — Ambulatory Visit (INDEPENDENT_AMBULATORY_CARE_PROVIDER_SITE_OTHER): Payer: Medicare Other

## 2014-07-04 ENCOUNTER — Encounter: Payer: Self-pay | Admitting: Podiatry

## 2014-07-04 VITALS — BP 132/72 | HR 73 | Resp 18

## 2014-07-04 DIAGNOSIS — M7661 Achilles tendinitis, right leg: Secondary | ICD-10-CM

## 2014-07-04 DIAGNOSIS — M722 Plantar fascial fibromatosis: Secondary | ICD-10-CM

## 2014-07-04 DIAGNOSIS — B351 Tinea unguium: Secondary | ICD-10-CM

## 2014-07-04 DIAGNOSIS — L608 Other nail disorders: Secondary | ICD-10-CM

## 2014-07-04 DIAGNOSIS — L603 Nail dystrophy: Secondary | ICD-10-CM | POA: Diagnosis not present

## 2014-07-04 MED ORDER — MELOXICAM 7.5 MG PO TABS: 7.5000 mg | ORAL_TABLET | Freq: Every day | ORAL | Status: DC

## 2014-07-04 NOTE — Progress Notes (Signed)
   Subjective:    Patient ID: Carol Byrd, female    DOB: 06-28-1945, 69 y.o.   MRN: [id number]  HPI  69 year old female presents the office with complaints of bilateral heel pain. She states that she previously was seen orthopedics for a pump bump. She states that she was dispensed a compound cream and had shoe gear modifications which seem to help the left side however she has started to note the right side is becoming symptomatic. She also states that she has pain in the bottom of her left heel particular in the morning which is relieved by ambulation. She denies any history of injury or trauma or any change or increase in activity the time of onset of symptoms. The pain does not wake her up at night. She denies any swelling or redness over the area. She also states that both of her big toenails are discolored and thickened. She states that she's had multiple treatments for nail fungus and she is inquiring if there is any further treatment. She states the nails do not hurt. Denies any redness or drainage. No other complaints at this time.    Review of Systems  All other systems reviewed and are negative.      Objective:   Physical Exam AAO x3, NAD DP/PT pulses palpable bilaterally, CRT less than 3 seconds Protective sensation intact with Simms Weinstein monofilament, vibratory sensation intact, Achilles tendon reflex intact Tenderness to palpation overlying the plantar medial tubercle of the calcaneus to left heel at the insertion of the plantar fascia. There is no pain along the course of plantar fascial within the arch of the foot. There is no pain with lateral compression of the calcaneus or pain the vibratory sensation. No pain on the plantar right heel. Thre is pain on the posterior aspect of the calcaneus or along the course/insertion of the Achilles tendon on the right. No pain along the mid substance of the Achilles tendon there is no defect noted. A Thompson test was performed and  the Achilles tendon is intact. There is no pain to the left posterior heel although there is a prominent retrocalcaneal exostosis. There is no overlying edema, erythema, increase in warmth bilaterally. No other areas of tenderness palpation or pain with vibratory sensation to the foot/ankle. MMT 5/5, ROM WNL Bilateral hallux nails are slightly discolored, hypertrophic and dystrophic. There is no swelling erythema or drainage. No tenderness along the nails. Remaining nails without pathology. No open lesions or pre-ulcerative lesions are identified. No pain with calf compression, swelling, warmth, erythema.      Assessment & Plan:   69 year old female with left foot plantar fasciitis, right posterior insertional Achilles tendinitis with retrocalcaneal exostosis. -X-rays were obtained and reviewed with the patient. -Treatment options discussed including alternatives, risks, complications. Discussed likely etiology. -Discussed possible steroid injections the left heel all the patient wishes to hold off at this time. -Discussed stretching exercises -Ice to the area. -Continue the compound cream for which he has a prescription for at-home. -Prescribed meloxicam. Discussed with her side effects the medication and directed to stop immediately should any occur. Discussed with her to only uses on a short-term basis.  -Discussed shoe gear modifications. -In regards to the bilateral hallux nails biopsy was obtained and the nails were sent to [facility] labs for evaluation.  -Follow-up in 3 weeks or sooner should any problems arise. In the meantime, encouraged to call the office with any questions, concerns, change in symptoms.

## 2014-07-05 DIAGNOSIS — B351 Tinea unguium: Secondary | ICD-10-CM | POA: Diagnosis not present

## 2014-07-05 DIAGNOSIS — L601 Onycholysis: Secondary | ICD-10-CM | POA: Diagnosis not present

## 2014-07-05 NOTE — Addendum Note (Signed)
Addended by: Cranford Mon R on: 07/05/2014 10:41 AM   Modules accepted: Orders

## 2014-07-24 ENCOUNTER — Encounter: Payer: Self-pay | Admitting: Podiatry

## 2014-07-26 ENCOUNTER — Ambulatory Visit (INDEPENDENT_AMBULATORY_CARE_PROVIDER_SITE_OTHER): Payer: Medicare Other | Admitting: Podiatry

## 2014-07-26 VITALS — BP 127/69 | HR 71 | Resp 16

## 2014-07-26 DIAGNOSIS — M722 Plantar fascial fibromatosis: Secondary | ICD-10-CM

## 2014-07-26 MED ORDER — TRIAMCINOLONE ACETONIDE 10 MG/ML IJ SUSP
10.0000 mg | Freq: Once | INTRAMUSCULAR | Status: AC
  Administered 2014-07-26: 10 mg

## 2014-07-26 NOTE — Progress Notes (Signed)
Patient ID: Carol Byrd, female   DOB: 10-10-1945, 69 y.o.   MRN: [id number]  Subjective: 69 year old female returns the office they for follow-up evaluation of left heel pain. She states that she continues to have some discomfort to the left heel along the bottom although she does it is improved compared to last appointment. She currently states that she does not have any pain to the right side. She denies any swelling or redness overlying the area. She states that she has continue with the stretching icing activities. No other complaints at this time. Denies systemic complaints such as fevers, chills, nausea, vomiting.  Objective: AAO 3, NAD DP/PT pulses palpable, CRT less than 3 seconds Protective sensation intact with Simms Weinstein monofilament, Achilles tendon reflex intact. There is tenderness to palpation over the plantar medial tubercle of the calcaneus at the insertion of the plantar fascia on the left foot. There is no pain along the course of the plantar fascia within the arch of the foot and the plantar fascia appears to be intact. There is no pain with lateral compression or pain with vibratory sensation bilaterally. No pain along the posterior aspect of the calcaneus or along the course/insertion of the Achilles tendon b/l. There is a prominent retrocalcaneal exostosis b/l. No other areas of tenderness to bilateral lower extremities. No overlying edema, erythema, increase in warmth. MMT 5/5, ROM WNL No open lesions or pre-ulcerative lesions identified bilaterally. No pain with calf compression, swelling, warmth, erythema.  Assessment: 69 year old female with resolving bilateral heel pain with continued left plantar fasciitis pain.  Plan: -Treatment options discussed including alternatives, risks, complications. -Patient elects to proceed with steroid injection into the left plantar heel. Under sterile skin preparation, a total of 2.5cc of kenalog 10, 0.5% Marcaine plain, and 2%  lidocaine plain were infiltrated into the symptomatic area without complication. A band-aid was applied. Patient tolerated the injection well without complication. Post-injection care with discussed with the patient. Discussed with the patient to ice the area over the next couple of days to help prevent a steroid flare. Plantar fascial taping was applied.  -Continue anti-inflammatory medications. She states that she's been taking his intermittently. -Continue stretching and icing activities. -Discussed shoe gear modifications. Recommended not to go barefoot -Discussed possible orthotics. At this time she'll look at purchasing an over-the-counter orthotics. -Follow-up in 3 weeks or sooner if any problems are to arise. In the meantime encouraged to call the office with any questions, concerns, change in symptoms.

## 2014-07-26 NOTE — Patient Instructions (Signed)
Plantar Fasciitis (Heel Spur Syndrome) with Rehab The plantar fascia is a fibrous, ligament-like, soft-tissue structure that spans the bottom of the foot. Plantar fasciitis is a condition that causes pain in the foot due to inflammation of the tissue. SYMPTOMS   Pain and tenderness on the underneath side of the foot.  Pain that worsens with standing or walking. CAUSES  Plantar fasciitis is caused by irritation and injury to the plantar fascia on the underneath side of the foot. Common mechanisms of injury include:  Direct trauma to bottom of the foot.  Damage to a small nerve that runs under the foot where the main fascia attaches to the heel bone.  Stress placed on the plantar fascia due to bone spurs. RISK INCREASES WITH:   Activities that place stress on the plantar fascia (running, jumping, pivoting, or cutting).  Poor strength and flexibility.  Improperly fitted shoes.  Tight calf muscles.  Flat feet.  Failure to warm-up properly before activity.  Obesity. PREVENTION  Warm up and stretch properly before activity.  Allow for adequate recovery between workouts.  Maintain physical fitness:  Strength, flexibility, and endurance.  Cardiovascular fitness.  Maintain a health body weight.  Avoid stress on the plantar fascia.  Wear properly fitted shoes, including arch supports for individuals who have flat feet. PROGNOSIS  If treated properly, then the symptoms of plantar fasciitis usually resolve without surgery. However, occasionally surgery is necessary. RELATED COMPLICATIONS   Recurrent symptoms that may result in a chronic condition.  Problems of the lower back that are caused by compensating for the injury, such as limping.  Pain or weakness of the foot during push-off following surgery.  Chronic inflammation, scarring, and partial or complete fascia tear, occurring more often from repeated injections. TREATMENT  Treatment initially involves the use of  ice and medication to help reduce pain and inflammation. The use of strengthening and stretching exercises may help reduce pain with activity, especially stretches of the Achilles tendon. These exercises may be performed at home or with a therapist. Your caregiver may recommend that you use heel cups of arch supports to help reduce stress on the plantar fascia. Occasionally, corticosteroid injections are given to reduce inflammation. If symptoms persist for greater than 6 months despite non-surgical (conservative), then surgery may be recommended.  MEDICATION   If pain medication is necessary, then nonsteroidal anti-inflammatory medications, such as aspirin and ibuprofen, or other minor pain relievers, such as acetaminophen, are often recommended.  Do not take pain medication within 7 days before surgery.  Prescription pain relievers may be given if deemed necessary by your caregiver. Use only as directed and only as much as you need.  Corticosteroid injections may be given by your caregiver. These injections should be reserved for the most serious cases, because they may only be given a certain number of times. HEAT AND COLD  Cold treatment (icing) relieves pain and reduces inflammation. Cold treatment should be applied for 10 to 15 minutes every 2 to 3 hours for inflammation and pain and immediately after any activity that aggravates your symptoms. Use ice packs or massage the area with a piece of ice (ice massage).  Heat treatment may be used prior to performing the stretching and strengthening activities prescribed by your caregiver, physical therapist, or athletic trainer. Use a heat pack or soak the injury in warm water. SEEK IMMEDIATE MEDICAL CARE IF:  Treatment seems to offer no benefit, or the condition worsens.  Any medications produce adverse side effects. EXERCISES RANGE   OF MOTION (ROM) AND STRETCHING EXERCISES - Plantar Fasciitis (Heel Spur Syndrome) These exercises may help you  when beginning to rehabilitate your injury. Your symptoms may resolve with or without further involvement from your physician, physical therapist or athletic trainer. While completing these exercises, remember:   Restoring tissue flexibility helps normal motion to return to the joints. This allows healthier, less painful movement and activity.  An effective stretch should be held for at least 30 seconds.  A stretch should never be painful. You should only feel a gentle lengthening or release in the stretched tissue. RANGE OF MOTION - Toe Extension, Flexion  Sit with your right / left leg crossed over your opposite knee.  Grasp your toes and gently pull them back toward the top of your foot. You should feel a stretch on the bottom of your toes and/or foot.  Hold this stretch for __________ seconds.  Now, gently pull your toes toward the bottom of your foot. You should feel a stretch on the top of your toes and or foot.  Hold this stretch for __________ seconds. Repeat __________ times. Complete this stretch __________ times per day.  RANGE OF MOTION - Ankle Dorsiflexion, Active Assisted  Remove shoes and sit on a chair that is preferably not on a carpeted surface.  Place right / left foot under knee. Extend your opposite leg for support.  Keeping your heel down, slide your right / left foot back toward the chair until you feel a stretch at your ankle or calf. If you do not feel a stretch, slide your bottom forward to the edge of the chair, while still keeping your heel down.  Hold this stretch for __________ seconds. Repeat __________ times. Complete this stretch __________ times per day.  STRETCH - Gastroc, Standing  Place hands on wall.  Extend right / left leg, keeping the front knee somewhat bent.  Slightly point your toes inward on your back foot.  Keeping your right / left heel on the floor and your knee straight, shift your weight toward the wall, not allowing your back to  arch.  You should feel a gentle stretch in the right / left calf. Hold this position for __________ seconds. Repeat __________ times. Complete this stretch __________ times per day. STRETCH - Soleus, Standing  Place hands on wall.  Extend right / left leg, keeping the other knee somewhat bent.  Slightly point your toes inward on your back foot.  Keep your right / left heel on the floor, bend your back knee, and slightly shift your weight over the back leg so that you feel a gentle stretch deep in your back calf.  Hold this position for __________ seconds. Repeat __________ times. Complete this stretch __________ times per day. STRETCH - Gastrocsoleus, Standing  Note: This exercise can place a lot of stress on your foot and ankle. Please complete this exercise only if specifically instructed by your caregiver.   Place the ball of your right / left foot on a step, keeping your other foot firmly on the same step.  Hold on to the wall or a rail for balance.  Slowly lift your other foot, allowing your body weight to press your heel down over the edge of the step.  You should feel a stretch in your right / left calf.  Hold this position for __________ seconds.  Repeat this exercise with a slight bend in your right / left knee. Repeat __________ times. Complete this stretch __________ times per day.    STRENGTHENING EXERCISES - Plantar Fasciitis (Heel Spur Syndrome)  These exercises may help you when beginning to rehabilitate your injury. They may resolve your symptoms with or without further involvement from your physician, physical therapist or athletic trainer. While completing these exercises, remember:   Muscles can gain both the endurance and the strength needed for everyday activities through controlled exercises.  Complete these exercises as instructed by your physician, physical therapist or athletic trainer. Progress the resistance and repetitions only as guided. STRENGTH -  Towel Curls  Sit in a chair positioned on a non-carpeted surface.  Place your foot on a towel, keeping your heel on the floor.  Pull the towel toward your heel by only curling your toes. Keep your heel on the floor.  If instructed by your physician, physical therapist or athletic trainer, add ____________________ at the end of the towel. Repeat __________ times. Complete this exercise __________ times per day. STRENGTH - Ankle Inversion  Secure one end of a rubber exercise band/tubing to a fixed object (table, pole). Loop the other end around your foot just before your toes.  Place your fists between your knees. This will focus your strengthening at your ankle.  Slowly, pull your big toe up and in, making sure the band/tubing is positioned to resist the entire motion.  Hold this position for __________ seconds.  Have your muscles resist the band/tubing as it slowly pulls your foot back to the starting position. Repeat __________ times. Complete this exercises __________ times per day.  Document Released: 05/05/2005 Document Revised: 07/28/2011 Document Reviewed: 08/17/2008 [employer] Patient Information 2015 [employer]. This information is not intended to replace advice given to you by your health care provider. Make sure you discuss any questions you have with your health care provider.  

## 2014-08-23 ENCOUNTER — Ambulatory Visit: Payer: Medicare Other | Admitting: Podiatry

## 2014-08-30 ENCOUNTER — Other Ambulatory Visit: Payer: Self-pay

## 2014-08-30 DIAGNOSIS — Z1231 Encounter for screening mammogram for malignant neoplasm of breast: Secondary | ICD-10-CM

## 2014-09-04 DIAGNOSIS — L658 Other specified nonscarring hair loss: Secondary | ICD-10-CM | POA: Diagnosis not present

## 2014-09-04 DIAGNOSIS — L821 Other seborrheic keratosis: Secondary | ICD-10-CM | POA: Diagnosis not present

## 2014-09-04 DIAGNOSIS — E890 Postprocedural hypothyroidism: Secondary | ICD-10-CM | POA: Diagnosis not present

## 2014-09-04 DIAGNOSIS — I781 Nevus, non-neoplastic: Secondary | ICD-10-CM | POA: Diagnosis not present

## 2014-09-04 DIAGNOSIS — W57XXXA Bitten or stung by nonvenomous insect and other nonvenomous arthropods, initial encounter: Secondary | ICD-10-CM | POA: Diagnosis not present

## 2014-09-04 DIAGNOSIS — L659 Nonscarring hair loss, unspecified: Secondary | ICD-10-CM | POA: Diagnosis not present

## 2014-09-04 DIAGNOSIS — T148 Other injury of unspecified body region: Secondary | ICD-10-CM | POA: Diagnosis not present

## 2014-09-04 DIAGNOSIS — D1801 Hemangioma of skin and subcutaneous tissue: Secondary | ICD-10-CM | POA: Diagnosis not present

## 2014-09-19 DIAGNOSIS — I10 Essential (primary) hypertension: Secondary | ICD-10-CM | POA: Diagnosis not present

## 2014-09-21 ENCOUNTER — Ambulatory Visit: Payer: Medicare Other

## 2014-09-28 ENCOUNTER — Ambulatory Visit
Admission: RE | Admit: 2014-09-28 | Discharge: 2014-09-28 | Disposition: A | Discharge status: Home / Self Care | Payer: Medicare Other | Source: Ambulatory Visit

## 2014-09-28 DIAGNOSIS — Z1231 Encounter for screening mammogram for malignant neoplasm of breast: Secondary | ICD-10-CM | POA: Diagnosis not present

## 2014-09-29 ENCOUNTER — Ambulatory Visit (INDEPENDENT_AMBULATORY_CARE_PROVIDER_SITE_OTHER): Payer: Medicare Other | Admitting: Podiatry

## 2014-09-29 ENCOUNTER — Encounter: Payer: Self-pay | Admitting: Podiatry

## 2014-09-29 ENCOUNTER — Ambulatory Visit (INDEPENDENT_AMBULATORY_CARE_PROVIDER_SITE_OTHER): Payer: Medicare Other

## 2014-09-29 VITALS — BP 130/71 | HR 72 | Resp 12

## 2014-09-29 DIAGNOSIS — M79671 Pain in right foot: Secondary | ICD-10-CM

## 2014-09-29 DIAGNOSIS — M722 Plantar fascial fibromatosis: Secondary | ICD-10-CM

## 2014-09-29 DIAGNOSIS — M7661 Achilles tendinitis, right leg: Secondary | ICD-10-CM | POA: Diagnosis not present

## 2014-09-29 MED ORDER — TRIAMCINOLONE ACETONIDE 10 MG/ML IJ SUSP
10.0000 mg | Freq: Once | INTRAMUSCULAR | Status: AC
  Administered 2014-09-29: 10 mg

## 2014-09-29 NOTE — Patient Instructions (Signed)

## 2014-10-01 NOTE — Progress Notes (Signed)
Subjective:     Patient ID: Carol Byrd, female   DOB: [birth year], 69 y.o.   MRN: [id number]  HPI patient states I have pain in the posterior aspect of my right heel that has been bothering me quite a bit. States that it started in the last several months and that it's making it hard for her to walk or wear shoe gear   Review of Systems     Objective:   Physical Exam Neurovascular status found to be intact with muscle strength adequate range of motion within normal limits. I noted there to be discomfort in the posterior aspect of the right heel around the Achilles tendon insertion with inflammation on the medial side. Her plantar heel left is improved but still is mildly painful when palpated    Assessment:     Several problems of which the posterior heel pain right is most acute. Continue chronic plantar fasciitis left    Plan:     X-rays of right foot reviewed and H&P reviewed with patient. I've advised on very careful injection of the medial side and I did explain chances for rupture associated with it and she is willing to accept risk. At this time I went ahead and I carefully injected the medial side 3 mg dexamethasone Kenalog 5 mg Xylocaine and advised on ice therapy and support. Patient for the left will continue with immobilization and anti-inflammatories and will be seen back to recheck

## 2014-10-05 DIAGNOSIS — H35372 Puckering of macula, left eye: Secondary | ICD-10-CM | POA: Diagnosis not present

## 2014-10-05 DIAGNOSIS — Z961 Presence of intraocular lens: Secondary | ICD-10-CM | POA: Diagnosis not present

## 2014-10-05 DIAGNOSIS — H33002 Unspecified retinal detachment with retinal break, left eye: Secondary | ICD-10-CM | POA: Diagnosis not present

## 2014-12-04 ENCOUNTER — Encounter (INDEPENDENT_AMBULATORY_CARE_PROVIDER_SITE_OTHER): Payer: Self-pay | Admitting: *Deleted

## 2014-12-14 ENCOUNTER — Other Ambulatory Visit (INDEPENDENT_AMBULATORY_CARE_PROVIDER_SITE_OTHER): Payer: Self-pay | Admitting: *Deleted

## 2014-12-14 DIAGNOSIS — Z1211 Encounter for screening for malignant neoplasm of colon: Secondary | ICD-10-CM

## 2014-12-18 ENCOUNTER — Telehealth (INDEPENDENT_AMBULATORY_CARE_PROVIDER_SITE_OTHER): Payer: Self-pay | Admitting: *Deleted

## 2014-12-18 NOTE — Telephone Encounter (Signed)
Patient needs suprep 

## 2014-12-19 MED ORDER — SUPREP BOWEL PREP KIT 17.5-3.13-1.6 GM/177ML PO SOLN: 1.0000 | Freq: Once | ORAL | Status: DC

## 2014-12-20 DIAGNOSIS — E039 Hypothyroidism, unspecified: Secondary | ICD-10-CM | POA: Diagnosis not present

## 2014-12-20 DIAGNOSIS — I10 Essential (primary) hypertension: Secondary | ICD-10-CM | POA: Diagnosis not present

## 2014-12-20 DIAGNOSIS — R7301 Impaired fasting glucose: Secondary | ICD-10-CM | POA: Diagnosis not present

## 2014-12-20 DIAGNOSIS — E785 Hyperlipidemia, unspecified: Secondary | ICD-10-CM | POA: Diagnosis not present

## 2014-12-20 DIAGNOSIS — Z79899 Other long term (current) drug therapy: Secondary | ICD-10-CM | POA: Diagnosis not present

## 2014-12-26 DIAGNOSIS — E039 Hypothyroidism, unspecified: Secondary | ICD-10-CM | POA: Diagnosis not present

## 2014-12-26 DIAGNOSIS — I10 Essential (primary) hypertension: Secondary | ICD-10-CM | POA: Diagnosis not present

## 2014-12-26 DIAGNOSIS — Z0001 Encounter for general adult medical examination with abnormal findings: Secondary | ICD-10-CM | POA: Diagnosis not present

## 2014-12-26 DIAGNOSIS — E785 Hyperlipidemia, unspecified: Secondary | ICD-10-CM | POA: Diagnosis not present

## 2014-12-26 DIAGNOSIS — Z6828 Body mass index (BMI) 28.0-28.9, adult: Secondary | ICD-10-CM | POA: Diagnosis not present

## 2015-01-03 ENCOUNTER — Encounter: Payer: Self-pay | Admitting: Podiatry

## 2015-01-03 ENCOUNTER — Ambulatory Visit (INDEPENDENT_AMBULATORY_CARE_PROVIDER_SITE_OTHER): Payer: Medicare Other | Admitting: Podiatry

## 2015-01-03 VITALS — BP 115/64 | HR 72 | Resp 16

## 2015-01-03 DIAGNOSIS — M722 Plantar fascial fibromatosis: Secondary | ICD-10-CM

## 2015-01-03 DIAGNOSIS — M7661 Achilles tendinitis, right leg: Secondary | ICD-10-CM | POA: Diagnosis not present

## 2015-01-03 MED ORDER — TRIAMCINOLONE ACETONIDE 10 MG/ML IJ SUSP
10.0000 mg | Freq: Once | INTRAMUSCULAR | Status: AC
  Administered 2015-01-03: 10 mg

## 2015-01-03 NOTE — Progress Notes (Signed)
Subjective:     Patient ID: Carol Byrd, female   DOB: [birth year], 69 y.o.   MRN: [id number]  HPI patient states that the left heel that we worked on is doing very well but the back of the right heel is still bothering her a lot and she would like treatment. We discussed that I had it reversed at last visit and upon retrospective evaluation I did not do the injection of the Achilles tendon right I actually did the plantar fascia on the left which we reviewed   Review of Systems     Objective:   Physical Exam Neurovascular status intact muscle strength adequate with minimal discomfort plantar aspect left heel and the right posterior heel medial side quite tender when pressed with inflammation noted and no equinus condition or muscle strength loss    Assessment:     Continued Achilles tendinitis right of approximate 1 year duration with improved plantar fasciitis left    Plan:     Reviewed injection of right indicate explain chances for rupture. Patient wants to have this procedure understanding risk and today I carefully injected the medial side 3 mg dexamethasone Kenalog 5 mg Xylocaine and applied ice therapy. I then went ahead and applied air fracture walker to completely immobilize and discussed shockwave therapy of symptoms are not improved in 1 month

## 2015-01-16 ENCOUNTER — Telehealth (INDEPENDENT_AMBULATORY_CARE_PROVIDER_SITE_OTHER): Payer: Self-pay | Admitting: *Deleted

## 2015-01-16 NOTE — Telephone Encounter (Signed)
Referring MD/PCP: fagan   Procedure: tcs  Reason/Indication:  screening  Has patient had this procedure before?  Yes, 2006 -- epic  If so, when, by whom and where?    Is there a family history of colon cancer?  no  Who?  What age when diagnosed?    Is patient diabetic?   no      Does patient have prosthetic heart valve?  no  Do you have a pacemaker?  no  Has patient ever had endocarditis? no  Has patient had joint replacement within last 12 months?  no  Does patient tend to be constipated or take laxatives? no  Does patient have a history of alcohol/drug use?   Is patient on Coumadin, Plavix and/or Aspirin? yes  Medications: asa 81 mg daily, telmisartan 40 mg daily, levothyroxine 100 mcg daily, pantoprazole 40 mg daily, zetia 10 mg daily, finasteride 5 mg 1/2 tab daily  Allergies: nkda  Medication Adjustment: asa 2 days  Procedure date & time: 02/14/15 at 1200

## 2015-01-18 NOTE — Telephone Encounter (Signed)
agree

## 2015-01-31 ENCOUNTER — Encounter: Payer: Self-pay | Admitting: Podiatry

## 2015-01-31 ENCOUNTER — Ambulatory Visit (INDEPENDENT_AMBULATORY_CARE_PROVIDER_SITE_OTHER): Payer: Medicare Other | Admitting: Podiatry

## 2015-01-31 VITALS — BP 121/67 | HR 83 | Resp 16

## 2015-01-31 DIAGNOSIS — M7661 Achilles tendinitis, right leg: Secondary | ICD-10-CM

## 2015-01-31 DIAGNOSIS — B351 Tinea unguium: Secondary | ICD-10-CM

## 2015-01-31 NOTE — Progress Notes (Signed)
Subjective:     Patient ID: Carol Byrd, female   DOB: [birth year], 69 y.o.   MRN: [id number]  HPI patient presents stating I'm still having a lot of pain in my heel and immobilization is only helping a little bit and I also was wondering about my big toenails that had cultures done   Review of Systems     Objective:   Physical Exam Neurovascular status intact muscle strength adequate with range of motion within normal limits. Patient's noted to have discomfort in the posterior heel right on the medial side with inflammation at the insertion into the calcaneus with no equinus condition noted currently and has slight discoloration of the nailbeds hallux bilateral    Assessment:     Acute Achilles tendinitis right with chronic nature to condition along with nail damage hallux bilateral    Plan:     Reviewed condition concerning the Achilles tendon and have recommended shockwave due to failure to respond him mobilization injection treatment and patient wants this and scheduled for this procedure. I then went ahead and discussed that the nails indicated strictly trauma with no fungus some no current treatment is recommended. Reviewed shockwave therapy and complications associated with

## 2015-02-08 ENCOUNTER — Ambulatory Visit (INDEPENDENT_AMBULATORY_CARE_PROVIDER_SITE_OTHER): Payer: Medicare Other | Admitting: Podiatry

## 2015-02-08 DIAGNOSIS — M7661 Achilles tendinitis, right leg: Secondary | ICD-10-CM

## 2015-02-09 NOTE — Progress Notes (Signed)
Subjective:     Patient ID: Carol Byrd, female   DOB: 10-26-1945, 69 y.o.   MRN: [id number]  HPI patient states I am improving   Review of Systems     Objective:   Physical Exam Neurovascular status intact pain in the right Achilles which is getting better but she has had trouble without the boot on    Assessment:     Achilles tendinitis right    Plan:     Shockwave administered 3000 shocks at 3.6 intensity 17 frequency and reappoint one week

## 2015-02-14 ENCOUNTER — Encounter (HOSPITAL_COMMUNITY)
Admission: RE | Disposition: A | Discharge status: Home / Self Care | Payer: Self-pay | Source: Ambulatory Visit | Attending: Internal Medicine

## 2015-02-14 ENCOUNTER — Ambulatory Visit (HOSPITAL_COMMUNITY)
Admission: RE | Admit: 2015-02-14 | Discharge: 2015-02-14 | Disposition: A | Discharge status: Home / Self Care | Payer: Medicare Other | Source: Ambulatory Visit | Attending: Internal Medicine | Admitting: Internal Medicine

## 2015-02-14 ENCOUNTER — Encounter (HOSPITAL_COMMUNITY): Payer: Self-pay | Admitting: *Deleted

## 2015-02-14 DIAGNOSIS — K5730 Diverticulosis of large intestine without perforation or abscess without bleeding: Secondary | ICD-10-CM | POA: Diagnosis not present

## 2015-02-14 DIAGNOSIS — E780 Pure hypercholesterolemia: Secondary | ICD-10-CM | POA: Diagnosis not present

## 2015-02-14 DIAGNOSIS — Z7982 Long term (current) use of aspirin: Secondary | ICD-10-CM | POA: Diagnosis not present

## 2015-02-14 DIAGNOSIS — Z79899 Other long term (current) drug therapy: Secondary | ICD-10-CM | POA: Diagnosis not present

## 2015-02-14 DIAGNOSIS — K644 Residual hemorrhoidal skin tags: Secondary | ICD-10-CM | POA: Insufficient documentation

## 2015-02-14 DIAGNOSIS — Z1211 Encounter for screening for malignant neoplasm of colon: Secondary | ICD-10-CM | POA: Insufficient documentation

## 2015-02-14 DIAGNOSIS — K648 Other hemorrhoids: Secondary | ICD-10-CM | POA: Diagnosis not present

## 2015-02-14 HISTORY — PX: COLONOSCOPY: SHX5424

## 2015-02-14 SURGERY — COLONOSCOPY
Anesthesia: Moderate Sedation

## 2015-02-14 MED ORDER — MEPERIDINE HCL 50 MG/ML IJ SOLN
INTRAMUSCULAR | Status: DC
Start: 2015-02-14 — End: 2015-02-14
  Filled 2015-02-14: qty 1

## 2015-02-14 MED ORDER — STERILE WATER FOR IRRIGATION IR SOLN
Status: DC | PRN
  Administered 2015-02-14: 12:00:00

## 2015-02-14 MED ORDER — SODIUM CHLORIDE 0.9 % IV SOLN
INTRAVENOUS | Status: DC
  Administered 2015-02-14: 1000 mL via INTRAVENOUS

## 2015-02-14 MED ORDER — MIDAZOLAM HCL 5 MG/5ML IJ SOLN
INTRAMUSCULAR | Status: DC | PRN
  Administered 2015-02-14: 1 mg via INTRAVENOUS
  Administered 2015-02-14 (×2): 2 mg via INTRAVENOUS
  Administered 2015-02-14: 1 mg via INTRAVENOUS

## 2015-02-14 MED ORDER — MIDAZOLAM HCL 5 MG/5ML IJ SOLN
INTRAMUSCULAR | Status: AC
  Filled 2015-02-14: qty 10

## 2015-02-14 MED ORDER — MEPERIDINE HCL 50 MG/ML IJ SOLN
INTRAMUSCULAR | Status: DC | PRN
  Administered 2015-02-14 (×2): 25 mg via INTRAVENOUS

## 2015-02-14 NOTE — Op Note (Signed)
COLONOSCOPY PROCEDURE REPORT  PATIENT:  Carol Byrd  MR#:  [id number] Birthdate:  Oct 09, 1945, 69 y.o., female Endoscopist:  Dr. Rogene Houston, MD Referred By:  Dr. [name], MD Procedure Date: 02/14/2015  Procedure:   Colonoscopy  Indications:  Average risk screening colonoscopy. Last colonoscopy was in July 2006 revealing pancolonic diverticulosis.  Informed Consent:  The procedure and risks were reviewed with the patient and informed consent was obtained.  Medications:  Demerol 50 mg IV Versed 6 mg IV  Description of procedure:  After a digital rectal exam was performed, that colonoscope was advanced from the anus through the rectum and colon to the area of the cecum, ileocecal valve and appendiceal orifice. The cecum was deeply intubated. These structures were well-seen and photographed for the record. From the level of the cecum and ileocecal valve, the scope was slowly and cautiously withdrawn. The mucosal surfaces were carefully surveyed utilizing scope tip to flexion to facilitate fold flattening as needed. The scope was pulled down into the rectum where a thorough exam including retroflexion was performed.  Findings:   Prep satisfactory. Scattered diverticula throughout the colon but most of these were located in the region of hepatic flexure. Normal rectal mucosa. Small hemorrhoids below the dentate line.   Therapeutic/Diagnostic Maneuvers Performed:   None  Complications:  None  EBL: None  Cecal Withdrawal Time:  8 minutes  Impression:  Examination performed to cecum. Pancolonic diverticulosis. Small external hemorrhoids.  Recommendations:  Standard instructions given. High fiber diet. Benefiber 4 g by mouth daily at bedtime. Next screening exam in 10 years.  REHMAN,NAJEEB U  02/14/2015 12:18 PM  CC: Dr. [name], MD & Dr. Rayne Du ref. provider found

## 2015-02-14 NOTE — Discharge Instructions (Signed)
Resume usual medications and high fiber diet. Benefiber 4 g by mouth daily at bedtime. No driving for 24 hours. Can try Zantac OTC 150 mg twice daily or Prilosec OTC or Prevacid OTC for reflux symptoms. Next screening exam in 10 years.     Colonoscopy, Care After These instructions give you information on caring for yourself after your procedure. Your doctor may also give you more specific instructions. Call your doctor if you have any problems or questions after your procedure. HOME CARE  Do not drive for 24 hours.  Do not sign important papers or use machinery for 24 hours.  You may shower.  You may go back to your usual activities, but go slower for the first 24 hours.  Take rest breaks often during the first 24 hours.  Walk around or use warm packs on your belly (abdomen) if you have belly cramping or gas.  Drink enough fluids to keep your pee (urine) clear or pale yellow.  Resume your normal diet. Avoid heavy or fried foods.  Avoid drinking alcohol for 24 hours or as told by your doctor.  Only take medicines as told by your doctor. If a tissue sample (biopsy) was taken during the procedure:   Do not take aspirin or blood thinners for 7 days, or as told by your doctor.  Do not drink alcohol for 7 days, or as told by your doctor.  Eat soft foods for the first 24 hours. GET HELP IF: You still have a small amount of blood in your poop (stool) 2-3 days after the procedure. GET HELP RIGHT AWAY IF:  You have more than a small amount of blood in your poop.  You see clumps of tissue (blood clots) in your poop.  Your belly is puffy (swollen).  You feel sick to your stomach (nauseous) or throw up (vomit).  You have a fever.  You have belly pain that gets worse and medicine does not help. MAKE SURE YOU:  Understand these instructions.  Will watch your condition.  Will get help right away if you are not doing well or get worse. Document Released: 06/07/2010  Document Revised: 05/10/2013 Document Reviewed: 01/10/2013 [facility] Patient Information 2015 [place], [place]. This information is not intended to replace advice given to you by your health care provider. Make sure you discuss any questions you have with your health care provider.   Diverticulosis Diverticulosis is the condition that develops when small pouches (diverticula) form in the wall of your colon. Your colon, or large intestine, is where water is absorbed and stool is formed. The pouches form when the inside layer of your colon pushes through weak spots in the outer layers of your colon. CAUSES  No one knows exactly what causes diverticulosis. RISK FACTORS  Being older than 24. Your risk for this condition increases with age. Diverticulosis is rare in people younger than 40 years. By age 42, almost everyone has it.  Eating a low-fiber diet.  Being frequently constipated.  Being overweight.  Not getting enough exercise.  Smoking.  Taking over-the-counter pain medicines, like aspirin and ibuprofen. SYMPTOMS  Most people with diverticulosis do not have symptoms. DIAGNOSIS  Because diverticulosis often has no symptoms, health care providers often discover the condition during an exam for other colon problems. In many cases, a health care provider will diagnose diverticulosis while using a flexible scope to examine the colon (colonoscopy). TREATMENT  If you have never developed an infection related to diverticulosis, you may not need treatment. If  you have had an infection before, treatment may include:  Eating more fruits, vegetables, and grains.  Taking a fiber supplement.  Taking a live bacteria supplement (probiotic).  Taking medicine to relax your colon. HOME CARE INSTRUCTIONS   Drink at least 6-8 glasses of water each day to prevent constipation.  Try not to strain when you have a bowel movement.  Keep all follow-up appointments. If you have had an infection  before:  Increase the fiber in your diet as directed by your health care provider or dietitian.  Take a dietary fiber supplement if your health care provider approves.  Only take medicines as directed by your health care provider. SEEK MEDICAL CARE IF:   You have abdominal pain.  You have bloating.  You have cramps.  You have not gone to the bathroom in 3 days. SEEK IMMEDIATE MEDICAL CARE IF:   Your pain gets worse.  Yourbloating becomes very bad.  You have a fever or chills, and your symptoms suddenly get worse.  You begin vomiting.  You have bowel movements that are bloody or black. MAKE SURE YOU:  Understand these instructions.  Will watch your condition.  Will get help right away if you are not doing well or get worse. Document Released: 01/31/2004 Document Revised: 05/10/2013 Document Reviewed: 03/30/2013 [facility] Patient Information 2015 [place], [place]. This information is not intended to replace advice given to you by your health care provider. Make sure you discuss any questions you have with your health care provider.  High-Fiber Diet Fiber is found in fruits, vegetables, and grains. A high-fiber diet encourages the addition of more whole grains, legumes, fruits, and vegetables in your diet. The recommended amount of fiber for adult males is 38 g per day. For adult females, it is 25 g per day. Pregnant and lactating women should get 28 g of fiber per day. If you have a digestive or bowel problem, ask your caregiver for advice before adding high-fiber foods to your diet. Eat a variety of high-fiber foods instead of only a select few type of foods.  PURPOSE  To increase stool bulk.  To make bowel movements more regular to prevent constipation.  To lower cholesterol.  To prevent overeating. WHEN IS THIS DIET USED?  It may be used if you have constipation and hemorrhoids.  It may be used if you have uncomplicated diverticulosis (intestine condition) and  irritable bowel syndrome.  It may be used if you need help with weight management.  It may be used if you want to add it to your diet as a protective measure against atherosclerosis, diabetes, and cancer. SOURCES OF FIBER  Whole-grain breads and cereals.  Fruits, such as apples, oranges, bananas, berries, prunes, and pears.  Vegetables, such as green peas, carrots, sweet potatoes, beets, broccoli, cabbage, spinach, and artichokes.  Legumes, such split peas, soy, lentils.  Almonds. FIBER CONTENT IN FOODS Starches and Grains / Dietary Fiber (g)  Cheerios, 1 cup / 3 g  Corn Flakes cereal, 1 cup / 0.7 g  Rice crispy treat cereal, 1 cup / 0.3 g  Instant oatmeal (cooked),  cup / 2 g  Frosted wheat cereal, 1 cup / 5.1 g  Brown, long-grain rice (cooked), 1 cup / 3.5 g  White, long-grain rice (cooked), 1 cup / 0.6 g  Enriched macaroni (cooked), 1 cup / 2.5 g Legumes / Dietary Fiber (g)  Baked beans (canned, plain, or vegetarian),  cup / 5.2 g  Kidney beans (canned),  cup / 6.8  g  Pinto beans (cooked),  cup / 5.5 g Breads and Crackers / Dietary Fiber (g)  Plain or honey graham crackers, 2 squares / 0.7 g  Saltine crackers, 3 squares / 0.3 g  Plain, salted pretzels, 10 pieces / 1.8 g  Whole-wheat bread, 1 slice / 1.9 g  White bread, 1 slice / 0.7 g  Raisin bread, 1 slice / 1.2 g  Plain bagel, 3 oz / 2 g  Flour tortilla, 1 oz / 0.9 g  Corn tortilla, 1 small / 1.5 g  Hamburger or hotdog bun, 1 small / 0.9 g Fruits / Dietary Fiber (g)  Apple with skin, 1 medium / 4.4 g  Sweetened applesauce,  cup / 1.5 g  Banana,  medium / 1.5 g  Grapes, 10 grapes / 0.4 g  Orange, 1 small / 2.3 g  Raisin, 1.5 oz / 1.6 g  Melon, 1 cup / 1.4 g Vegetables / Dietary Fiber (g)  Green beans (canned),  cup / 1.3 g  Carrots (cooked),  cup / 2.3 g  Broccoli (cooked),  cup / 2.8 g  Peas (cooked),  cup / 4.4 g  Mashed potatoes,  cup / 1.6 g  Lettuce, 1 cup /  0.5 g  Corn (canned),  cup / 1.6 g  Tomato,  cup / 1.1 g Document Released: 05/05/2005 Document Revised: 11/04/2011 Document Reviewed: 08/07/2011 [employer] Patient Information 2015 [place], [place]. This information is not intended to replace advice given to you by your health care provider. Make sure you discuss any questions you have with your health care provider.

## 2015-02-14 NOTE — H&P (Signed)
Carol Byrd is an 70 y.o. female.   Chief Complaint: Patient is here for colonoscopy. HPI: Patient is 69 year old Caucasian female who is here for screening colonoscopy. She denies abdominal pain change in bowel habits or rectal bleeding. Last colonoscopy was normal in July 2006. Family history is negative for CRC.  Past Medical History  Diagnosis Date  . High blood pressure   . High cholesterol     Past Surgical History  Procedure Laterality Date  . Thyroidectomy    . Cataracts    . Retinal tear repair cryotherapy    . Retinal peel      Family History  Problem Relation Age of Onset  . Heart disease    . Cancer    . Breast cancer Sister    Social History:  reports that she has quit smoking. She does not have any smokeless tobacco history on file. She reports that she drinks alcohol. She reports that she does not use illicit drugs.  Allergies: No Known Allergies  Medications Prior to Admission  Medication Sig Dispense Refill  . aspirin 81 MG tablet Take 81 mg by mouth daily.    . Biotin 5000 MCG TABS Take 5,000 mg by mouth daily.    Marland Kitchen ezetimibe (ZETIA) 10 MG tablet Take 10 mg by mouth daily.    . finasteride (PROSCAR) 5 MG tablet Take 5 mg by mouth daily.    Marland Kitchen levothyroxine (SYNTHROID, LEVOTHROID) 112 MCG tablet Take 112 mcg by mouth daily.      . Omega-3 Fatty Acids (FISH OIL TRIPLE STRENGTH) 1400 MG CAPS Take 1,400 mg by mouth.    . ranitidine (ZANTAC) 75 MG tablet Take 75 mg by mouth 2 (two) times daily.    [place] Gunning BOWEL PREP SOLN Take 1 kit by mouth once. 1 Bottle 0  . telmisartan (MICARDIS) 40 MG tablet Take 40 mg by mouth daily.      . meloxicam (MOBIC) 7.5 MG tablet Take 1 tablet (7.5 mg total) by mouth daily. 30 tablet 0  . simvastatin (ZOCOR) 40 MG tablet Take 40 mg by mouth at bedtime.        No results found for this or any previous visit (from the past 48 hour(s)). No results found.  ROS  Blood pressure 121/71, pulse 74, temperature 98.5 F (36.9  C), temperature source Oral, resp. rate 12, height 5' 7"  (1.702 m), weight 170 lb (77.111 kg), SpO2 100 %. Physical Exam  Constitutional: She appears well-developed and well-nourished.  HENT:  Mouth/Throat: Oropharynx is clear and moist.  Eyes: Conjunctivae are normal. No scleral icterus.  Neck: No thyromegaly present.  Cardiovascular: Normal rate, regular rhythm and normal heart sounds.   No murmur heard. Respiratory: Effort normal and breath sounds normal.  GI: Soft. She exhibits no distension and no mass. There is no tenderness.  Musculoskeletal: She exhibits no edema.  Lymphadenopathy:    She has no cervical adenopathy.  Neurological: She is alert.  Skin: Skin is warm and dry.     Assessment/Plan Average risk screening colonoscopy.  REHMAN,NAJEEB U 02/14/2015, 11:35 AM

## 2015-02-15 ENCOUNTER — Ambulatory Visit (INDEPENDENT_AMBULATORY_CARE_PROVIDER_SITE_OTHER): Payer: Medicare Other | Admitting: Podiatry

## 2015-02-15 DIAGNOSIS — M7661 Achilles tendinitis, right leg: Secondary | ICD-10-CM

## 2015-02-15 NOTE — Progress Notes (Signed)
Subjective:     Patient ID: Carol Byrd, female   DOB: Oct 16, 1945, 69 y.o.   MRN: [id number]  HPI patient presents for shockwave therapy right Achilles tendon medial side   Review of Systems     Objective:   Physical Exam Neurovascular status intact with discomfort on the medial aspect right Achilles has improved some from previous treatment    Assessment:     Achilles tendon right with mild improvement    Plan:     Shockwave administered 3000 shocks 4.1 intensity 17 frequency to the medial side of the right Achilles and reappoint one week

## 2015-02-16 ENCOUNTER — Encounter (HOSPITAL_COMMUNITY): Payer: Self-pay | Admitting: Internal Medicine

## 2015-02-22 ENCOUNTER — Ambulatory Visit (INDEPENDENT_AMBULATORY_CARE_PROVIDER_SITE_OTHER): Payer: Medicare Other | Admitting: Podiatry

## 2015-02-22 DIAGNOSIS — M7661 Achilles tendinitis, right leg: Secondary | ICD-10-CM

## 2015-02-22 NOTE — Progress Notes (Signed)
Subjective:     Patient ID: Carol Byrd, female   DOB: 03-07-1946, 69 y.o.   MRN: [id number]  HPI patient states my heel is feeling quite a bit better with pain only occasional   Review of Systems     Objective:   Physical Exam Neurovascular status intact with diminishment of discomfort in the posterior lateral aspect and medial aspect of the right Achilles tendinitis    Assessment:     Improving medial over lateral Achilles tendinitis right    Plan:     Shockwave administered medial side 3000 shocks 4.2 intensity 17 frequency

## 2015-03-13 DIAGNOSIS — Z961 Presence of intraocular lens: Secondary | ICD-10-CM | POA: Diagnosis not present

## 2015-03-13 DIAGNOSIS — H35372 Puckering of macula, left eye: Secondary | ICD-10-CM | POA: Diagnosis not present

## 2015-03-13 DIAGNOSIS — H524 Presbyopia: Secondary | ICD-10-CM | POA: Diagnosis not present

## 2015-03-26 ENCOUNTER — Ambulatory Visit (INDEPENDENT_AMBULATORY_CARE_PROVIDER_SITE_OTHER): Payer: Medicare Other | Admitting: Podiatry

## 2015-03-26 DIAGNOSIS — M7661 Achilles tendinitis, right leg: Secondary | ICD-10-CM

## 2015-03-26 DIAGNOSIS — M722 Plantar fascial fibromatosis: Secondary | ICD-10-CM

## 2015-03-28 NOTE — Progress Notes (Signed)
Subjective:     Patient ID: Carol Byrd, female   DOB: [birth year], 69 y.o.   MRN: [id number]  HPI patient states that I was doing quite a bit better for a few weeks and I've just had some reoccurrence   Review of Systems     Objective:   Physical Exam Neurovascular status intact with patient having improvement in Achilles tendinitis plantar fasciitis    Assessment:     Continued discomfort but improved    Plan:     Reviewed consideration for continuation of conservative care and today I did go ahead and shockwave was administered 4.2 intensity 3000 shocks 17 frequency and reappoint in 8 weeks when she returns from the beach

## 2015-05-28 ENCOUNTER — Ambulatory Visit: Payer: Medicare Other | Admitting: Podiatry

## 2015-07-05 DIAGNOSIS — I10 Essential (primary) hypertension: Secondary | ICD-10-CM | POA: Diagnosis not present

## 2015-07-05 DIAGNOSIS — M7661 Achilles tendinitis, right leg: Secondary | ICD-10-CM | POA: Diagnosis not present

## 2015-07-30 DIAGNOSIS — Z7982 Long term (current) use of aspirin: Secondary | ICD-10-CM | POA: Diagnosis not present

## 2015-07-30 DIAGNOSIS — M7661 Achilles tendinitis, right leg: Secondary | ICD-10-CM | POA: Diagnosis not present

## 2015-07-30 DIAGNOSIS — I10 Essential (primary) hypertension: Secondary | ICD-10-CM | POA: Diagnosis not present

## 2015-07-30 DIAGNOSIS — Z87891 Personal history of nicotine dependence: Secondary | ICD-10-CM | POA: Diagnosis not present

## 2015-08-09 ENCOUNTER — Ambulatory Visit (HOSPITAL_COMMUNITY): Payer: Medicare Other | Attending: Orthopaedic Surgery | Admitting: Physical Therapy

## 2015-08-09 DIAGNOSIS — R269 Unspecified abnormalities of gait and mobility: Secondary | ICD-10-CM | POA: Diagnosis not present

## 2015-08-09 DIAGNOSIS — M25671 Stiffness of right ankle, not elsewhere classified: Secondary | ICD-10-CM | POA: Diagnosis not present

## 2015-08-09 DIAGNOSIS — M2141 Flat foot [pes planus] (acquired), right foot: Secondary | ICD-10-CM | POA: Insufficient documentation

## 2015-08-09 DIAGNOSIS — R262 Difficulty in walking, not elsewhere classified: Secondary | ICD-10-CM | POA: Insufficient documentation

## 2015-08-09 DIAGNOSIS — R29898 Other symptoms and signs involving the musculoskeletal system: Secondary | ICD-10-CM

## 2015-08-09 DIAGNOSIS — M722 Plantar fascial fibromatosis: Secondary | ICD-10-CM | POA: Diagnosis not present

## 2015-08-09 NOTE — Therapy (Signed)
[place], [place], [address] Phone: [phone]   Fax:  [phone]  Physical Therapy Evaluation  Patient Details  Name: Carol Byrd MRN: [id number] Date of Birth: 15-Apr-1946 Referring Provider: Juliann Mule  Encounter Date: 08/09/2015      PT End of Session - 08/09/15 0928    Visit Number 1   Number of Visits 16   Date for PT Re-Evaluation 09/08/15   Authorization Type [employer] medicare    Authorization - Visit Number 1   Authorization - Number of Visits 10   PT Start Time 0845   PT Stop Time 0930   PT Time Calculation (min) 45 min   Activity Tolerance Patient tolerated treatment well   Behavior During Therapy [facility] for tasks assessed/performed      Past Medical History  Diagnosis Date  . High blood pressure   . High cholesterol     Past Surgical History  Procedure Laterality Date  . Thyroidectomy    . Cataracts    . Retinal tear repair cryotherapy    . Retinal peel    . Colonoscopy N/A 02/14/2015    Procedure: COLONOSCOPY;  Surgeon: Rogene Houston, MD;  Location: [facility];  Service: Endoscopy;  Laterality: N/A;  1200    There were no vitals filed for this visit.  Visit Diagnosis:  Plantar fasciitis of right foot - Plan: PT plan of care cert/re-cert  Difficulty walking down stairs - Plan: PT plan of care cert/re-cert  Abnormality of gait - Plan: PT plan of care cert/re-cert  Stiffness of ankle joint, right - Plan: PT plan of care cert/re-cert  Weakness of foot, right - Plan: PT plan of care cert/re-cert      Subjective Assessment - 08/09/15 0850    Subjective Ms. Blew has been having increased pain in her right heel for a year and a half.  She has been to a podiatrist and now has been referred to Dr. Cyril Mourning.  She has tried, night splints, paraffin bath, contrast bath, medication and she has not found any lasting relief.  The mornings are worse  but the pain is always there.      Limitations Standing;Walking;House hold activities   How long can you sit comfortably? no problem    How long can you stand comfortably? no problem   How long can you walk comfortably? There is always a pull; after sitting and getting up is difficulty.    Patient Stated Goals no pain    Currently in Pain? Yes   Pain Score 2   highest 7/10 in the mornings    Pain Location Heel   Pain Orientation Right   Pain Descriptors / Indicators Aching   Pain Onset More than a month ago   Pain Frequency Constant   Aggravating Factors  starting to walk after resting             [facility] PT Assessment - 08/09/15 0001    Assessment   Medical Diagnosis Rt achilles tendon   Referring Provider Juliann Mule   Onset Date/Surgical Date 07/30/15   Next MD Visit 09/13/2015   Prior Therapy none   Precautions   Precautions None   Restrictions   Weight Bearing Restrictions No   Balance Screen   Has the patient fallen in the past 6 months No   Has the patient had a decrease in activity level because of a fear of falling?  No   Is the  patient reluctant to leave their home because of a fear of falling?  No   Home Environment   Living Environment Private residence   Type of [place] Access Stairs to enter   Entrance Stairs-Number of Steps [place] Two level   Prior Function   Level of [place] Retired   Leisure travel, cooking , Community education officer    Observation/Other Assessments   Focus on Therapeutic Outcomes (FOTO)  66   Functional Tests   Functional tests Single leg stance   Single Leg Stance   Comments Lt 55; RT 15   ROM / Strength   AROM / PROM / Strength AROM;Strength   AROM   AROM Assessment Site Ankle   Right/Left Ankle Right   Right Ankle Dorsiflexion 5   Right Ankle Plantar Flexion 60   Right Ankle Inversion 20   Right Ankle Eversion 15   Strength   Strength Assessment Site Ankle   Right/Left Ankle Right   Right Ankle Dorsiflexion  4-/5   Right Ankle Plantar Flexion 5/5   Right Ankle Inversion 5/5   Right Ankle Eversion 5/5   Palpation   Palpation comment tight fascial restrictions; mm spasm                    [facility] Adult PT Treatment/Exercise - 08/09/15 0001    Exercises   Exercises Ankle   Manual Therapy   Manual Therapy Soft tissue mobilization   Manual therapy comments done seperately from all other aspects of treatment    Soft tissue mobilization to decrease spasm and fascial restrictions done while on stretch    Ankle Exercises: Stretches   Soleus Stretch 2 reps;30 seconds   Gastroc Stretch 2 reps;30 seconds                PT Education - 08/09/15 0928    Education provided Yes   Education Details hep   Person(s) Educated Patient   Methods Explanation;Demonstration;Verbal cues;Handout   Comprehension Verbalized understanding;Returned demonstration          PT Short Term Goals - 08/09/15 1337    PT SHORT TERM GOAL #1   Title Pt to be independent with home exercise program to allow increased  range of motion of her right ankle to improve gait mechanics.    Time 4   Period Weeks   Status New   PT SHORT TERM GOAL #2   Title Pt right heel  pain to be no greater than a 5/10 in the mornings when she goes to walk to decrease risk of falling    Time 4   Period Weeks   Status New   PT SHORT TERM GOAL #3   Title Pt right plantarflexion strength to increase to 4/10 to allow pt to state that she is able to descend steps without difficulty.   Time 4   Period Weeks   Status New   PT SHORT TERM GOAL #4   Title Pt balance improved to at least 30 second single leg stance to allow pt to be confident walking over uneven grass areas.    Time 4   Period Weeks   Status New           PT Long Term Goals - 08/09/15 1342    PT LONG TERM GOAL #1   Title Pt Right ankle ROM to be 12-15 degrees to allow pt to demonstrate normal gait pattern with good  heel strike and toe push off with equal  strides to allow pt to ambulate for over an hour without noticing any limping behaviors.    Time 8   Period Weeks   Status New   PT LONG TERM GOAL #2   Title Pt  right ankle pain level to be 3 or less when patient first gets out of bed for decreased risk of falling    Time 8   Period Weeks   Status New   PT LONG TERM GOAL #3   Title Pt right gastrocnemius musculatrue to be at least a 4+/5 to allow pt to be able to rise on her toes with no symptoms to place an object weighing 15# or less into a high cabinet.    Time 8   Period Weeks   Status New   PT LONG TERM GOAL #4   Title Pt to be able to verbalize self help treatments if symptoms return including but not limited to friction massage, stretching and icing    Time 8   Period Weeks   Status New   PT LONG TERM GOAL #5   Title Pt to be able to complete a single leg stance for 45 seconds on both legs to feel confident walking on grassy inclines and declines    Time 8   Period Weeks   Status New               Plan - 11-Aug-2015 1104    Clinical Impression Statement Ms. Jiles is a 70 yo female who has had progressive right heel pain for the past 18 months.  She has tried various medication, night splints and other techniques without resolve.  She is now being referred for skilled physical therapy to decrease her pain and improve her functional ability.  Examination demonstrates decreased strength, decreased ROM, increased fascial restrictions and increased muscle spasms, increased pain and abnormal gait. Marland Kitchen  She will benefit from skilled physical  to address these issues and decrease her pain.    Pt will benefit from skilled therapeutic intervention in order to improve on the following deficits Abnormal gait;Decreased activity tolerance;Decreased balance;Pain;Decreased strength;Increased fascial restricitons;Increased muscle spasms;Decreased range of motion   Rehab Potential Good   PT Frequency 2x / week   PT Duration 8 weeks   PT  Treatment/Interventions ADLs/Self Care Home Management;Cryotherapy;Electrical Stimulation;Iontophoresis '4mg'$ /ml Dexamethasone;Moist Heat;Ultrasound;Gait training;Therapeutic activities;Therapeutic exercise;Balance training;Neuromuscular re-education;Patient/family education;Manual techniques;Passive range of motion   PT Next Visit Plan Begin hallicus extensor longus stretch; slant board stretch; plantar fascia stretch,  and continue with manual    PT Home Exercise Plan given will need plantar fascia stretch.   Consulted and Agree with Plan of Care Patient          G-Codes - 11-Aug-2015 1119    Functional Assessment Tool Used foto   Functional Limitation Mobility: Walking and moving around   Mobility: Walking and Moving Around Current Status [phone]) At least 20 percent but less than 40 percent impaired, limited or restricted   Mobility: Walking and Moving Around Goal Status [phone]) At least 20 percent but less than 40 percent impaired, limited or restricted       Problem List Patient Active Problem List   Diagnosis Date Noted  . Achilles tendonitis 10/04/2013  . Metatarsal fracture 03/13/2011    Rayetta Humphrey, PT CLT [phone] 08-11-15, 1:52 PM  [place] [address] [place], [place], [address] Phone: [phone]   Fax:  [phone]  Name: AERIELLE STOKLOSA MRN: [id number] Date of Birth: 01/24/1946

## 2015-08-09 NOTE — Patient Instructions (Signed)
Achilles / Gastroc, Standing    Stand, right foot behind, heel on floor and turned slightly out, leg straight, forward leg bent. Move hips forward. Hold _30__ seconds. Repeat __3_ times per session. Do _3-4__ sessions per day.  Copyright  VHI. All rights reserved.  Achilles / Soleus, Standing    Stand, right foot behind, heel on floor and turned slightly out. Lower hips and bend knees. Hold _30__ seconds. Repeat _3__ times per session. Do __3-4_ sessions per day.  Copyright  VHI. All rights reserved.  Big Toe, Sitting    Sit, holding foot steady and grasp big toe. Pull toe forward and outward so stretch is felt in toe and under foot. Hold _30__ seconds. Repeat _3__ times per session. Do 3-4___ sessions per day.  Copyright  VHI. All rights reserved.  Balance: Unilateral    Attempt to balance on left leg, eyes open. Hold _30___ seconds. Repeat __3__ times per set. Do _1___ sets per session. Do _2___ sessions per day. Perform exercise with eyes closed.  [email or web address].[email or web address]   Copyright  VHI. All rights reserved.

## 2015-08-15 ENCOUNTER — Ambulatory Visit (HOSPITAL_COMMUNITY): Payer: Medicare Other | Admitting: Physical Therapy

## 2015-08-21 ENCOUNTER — Ambulatory Visit (HOSPITAL_COMMUNITY): Payer: Medicare Other | Attending: Orthopaedic Surgery | Admitting: Physical Therapy

## 2015-08-21 DIAGNOSIS — M2141 Flat foot [pes planus] (acquired), right foot: Secondary | ICD-10-CM | POA: Insufficient documentation

## 2015-08-21 DIAGNOSIS — M25571 Pain in right ankle and joints of right foot: Secondary | ICD-10-CM | POA: Diagnosis not present

## 2015-08-21 DIAGNOSIS — M722 Plantar fascial fibromatosis: Secondary | ICD-10-CM | POA: Diagnosis not present

## 2015-08-21 DIAGNOSIS — R262 Difficulty in walking, not elsewhere classified: Secondary | ICD-10-CM | POA: Diagnosis not present

## 2015-08-21 DIAGNOSIS — R29898 Other symptoms and signs involving the musculoskeletal system: Secondary | ICD-10-CM

## 2015-08-21 DIAGNOSIS — M6281 Muscle weakness (generalized): Secondary | ICD-10-CM | POA: Diagnosis not present

## 2015-08-21 DIAGNOSIS — R269 Unspecified abnormalities of gait and mobility: Secondary | ICD-10-CM | POA: Insufficient documentation

## 2015-08-21 DIAGNOSIS — M25671 Stiffness of right ankle, not elsewhere classified: Secondary | ICD-10-CM | POA: Insufficient documentation

## 2015-08-21 NOTE — Therapy (Signed)
[place], [place], [address] Phone: [phone]   Fax:  [phone]  Physical Therapy Treatment  Patient Details  Name: Carol Byrd MRN: [id number] Date of Birth: 26-Apr-1946 Referring Provider: Juliann Mule  Encounter Date: 08/21/2015      PT End of Session - 08/21/15 1153    Visit Number 2   Number of Visits 16   Date for PT Re-Evaluation 09/08/15   Authorization Type [employer] medicare    Authorization - Visit Number 2   Authorization - Number of Visits 10   PT Start Time 3614  took patient as treating PT stuck at hospital    PT Stop Time 1059   PT Time Calculation (min) 23 min   Activity Tolerance Patient tolerated treatment well   Behavior During Therapy [facility] for tasks assessed/performed      Past Medical History  Diagnosis Date  . High blood pressure   . High cholesterol     Past Surgical History  Procedure Laterality Date  . Thyroidectomy    . Cataracts    . Retinal tear repair cryotherapy    . Retinal peel    . Colonoscopy N/A 02/14/2015    Procedure: COLONOSCOPY;  Surgeon: Rogene Houston, MD;  [facility];  Service: Endoscopy;  Laterality: N/A;  1200    There were no vitals filed for this visit.  Visit Diagnosis:  Plantar fasciitis of right foot  Difficulty walking down stairs  Abnormality of gait  Stiffness of ankle joint, right  Weakness of foot, right      Subjective Assessment - 08/21/15 1039    Currently in Pain? Yes   Pain Score 6    Pain Location Heel   Pain Orientation Right   Pain Descriptors / Indicators Other (Comment)  stinging    Pain Type Chronic pain   Pain Radiating Towards sometimes comes up back of leg    Pain Onset More than a month ago   Pain Frequency Constant   Aggravating Factors  flat shoes    Pain Relieving Factors paraffin wax    Effect of Pain on Daily Activities wakling extended distances                           [facility] Adult PT Treatment/Exercise - 08/21/15 0001    Manual Therapy   Manual Therapy Soft tissue mobilization;Joint mobilization   Manual therapy comments done seperately from all other aspects of treatment    Joint Mobilization grade 3 moblizations to metatarsals, supination/pronation, talar DF, and calcaneal inversion/eversion    Soft tissue mobilization gastroc and plantar fascial muscles                 PT Education - 08/21/15 1153    Education provided No          PT Short Term Goals - 08/09/15 1337    PT SHORT TERM GOAL #1   Title Pt to be independent with home exercise program to allow increased  range of motion of her right ankle to improve gait mechanics.    Time 4   Period Weeks   Status New   PT SHORT TERM GOAL #2   Title Pt right heel  pain to be no greater than a 5/10 in the mornings when she goes to walk to decrease risk of falling    Time 4   Period Weeks   Status New  PT SHORT TERM GOAL #3   Title Pt right plantarflexion strength to increase to 4/10 to allow pt to state that she is able to descend steps without difficulty.   Time 4   Period Weeks   Status New   PT SHORT TERM GOAL #4   Title Pt balance improved to at least 30 second single leg stance to allow pt to be confident walking over uneven grass areas.    Time 4   Period Weeks   Status New           PT Long Term Goals - 08/09/15 1342    PT LONG TERM GOAL #1   Title Pt Right ankle ROM to be 12-15 degrees to allow pt to demonstrate normal gait pattern with good heel strike and toe push off with equal strides to allow pt to ambulate for over an hour without noticing any limping behaviors.    Time 8   Period Weeks   Status New   PT LONG TERM GOAL #2   Title Pt  right ankle pain level to be 3 or less when patient first gets out of bed for decreased risk of falling    Time 8   Period Weeks   Status New   PT LONG TERM GOAL #3   Title Pt right gastrocnemius musculatrue to be at least  a 4+/5 to allow pt to be able to rise on her toes with no symptoms to place an object weighing 15# or less into a high cabinet.    Time 8   Period Weeks   Status New   PT LONG TERM GOAL #4   Title Pt to be able to verbalize self help treatments if symptoms return including but not limited to friction massage, stretching and icing    Time 8   Period Weeks   Status New   PT LONG TERM GOAL #5   Title Pt to be able to complete a single leg stance for 45 seconds on both legs to feel confident walking on grassy inclines and declines    Time 8   Period Weeks   Status New               Plan - 08/21/15 1153    Clinical Impression Statement Took patient back late as originial treating PT running late from hospital. Focused solely on manual to foot/heel today, with soft tissue mobilization and jiont manipulations of entire lower LE complex. Noted stiffness with talar DF and calcaneal mobilizations in general. Patient reported only mild improvement in pain at end of session.    Pt will benefit from skilled therapeutic intervention in order to improve on the following deficits Abnormal gait;Decreased activity tolerance;Decreased balance;Pain;Decreased strength;Increased fascial restricitons;Increased muscle spasms;Decreased range of motion   Rehab Potential Good   PT Frequency 2x / week   PT Duration 8 weeks   PT Treatment/Interventions ADLs/Self Care Home Management;Cryotherapy;Electrical Stimulation;Iontophoresis '4mg'$ /ml Dexamethasone;Moist Heat;Ultrasound;Gait training;Therapeutic activities;Therapeutic exercise;Balance training;Neuromuscular re-education;Patient/family education;Manual techniques;Passive range of motion   PT Next Visit Plan continue manual, functional stretching of hallicus extensor longus and plantar fascia, gait training    PT Home Exercise Plan given will need plantar fascia stretch.   Consulted and Agree with Plan of Care Patient        Problem List Patient Active  Problem List   Diagnosis Date Noted  . Achilles tendonitis 10/04/2013  . Metatarsal fracture 03/13/2011    Deniece Ree PT, DPT [phone]  [facility]  [place], [place], [address] Phone: [phone]   Fax:  [phone]  Name: Carol Byrd MRN: [id number] Date of Birth: Mar 24, 1946

## 2015-08-23 ENCOUNTER — Encounter (HOSPITAL_COMMUNITY): Payer: Medicare Other | Admitting: Physical Therapy

## 2015-08-28 ENCOUNTER — Ambulatory Visit (HOSPITAL_COMMUNITY): Payer: Medicare Other | Admitting: Physical Therapy

## 2015-08-28 DIAGNOSIS — M25571 Pain in right ankle and joints of right foot: Secondary | ICD-10-CM | POA: Diagnosis not present

## 2015-08-28 DIAGNOSIS — M25671 Stiffness of right ankle, not elsewhere classified: Secondary | ICD-10-CM | POA: Diagnosis not present

## 2015-08-28 DIAGNOSIS — R262 Difficulty in walking, not elsewhere classified: Secondary | ICD-10-CM | POA: Diagnosis not present

## 2015-08-28 DIAGNOSIS — M2141 Flat foot [pes planus] (acquired), right foot: Secondary | ICD-10-CM | POA: Diagnosis not present

## 2015-08-28 DIAGNOSIS — M722 Plantar fascial fibromatosis: Secondary | ICD-10-CM | POA: Diagnosis not present

## 2015-08-28 DIAGNOSIS — M6281 Muscle weakness (generalized): Secondary | ICD-10-CM | POA: Diagnosis not present

## 2015-08-28 DIAGNOSIS — R269 Unspecified abnormalities of gait and mobility: Secondary | ICD-10-CM | POA: Diagnosis not present

## 2015-08-28 DIAGNOSIS — R29898 Other symptoms and signs involving the musculoskeletal system: Secondary | ICD-10-CM

## 2015-08-28 NOTE — Therapy (Signed)
[place], [place], [address] Phone: [phone]   Fax:  [phone]  Physical Therapy Treatment  Patient Details  Name: Carol Byrd MRN: [id number] Date of Birth: [birth year] Referring Provider: Juliann Mule  Encounter Date: 08/28/2015      PT End of Session - 08/28/15 1128    Visit Number 3   Number of Visits 16   Date for PT Re-Evaluation 09/08/15   Authorization Type [employer] medicare    Authorization - Visit Number 3   Authorization - Number of Visits 10   PT Start Time 1033   PT Stop Time 1115   PT Time Calculation (min) 42 min   Activity Tolerance Patient tolerated treatment well   Behavior During Therapy [facility] for tasks assessed/performed      Past Medical History  Diagnosis Date  . High blood pressure   . High cholesterol     Past Surgical History  Procedure Laterality Date  . Thyroidectomy    . Cataracts    . Retinal tear repair cryotherapy    . Retinal peel    . Colonoscopy N/A 02/14/2015    Procedure: COLONOSCOPY;  Surgeon: Rogene Houston, MD;  [facility];  Service: Endoscopy;  Laterality: N/A;  1200    There were no vitals filed for this visit.      Subjective Assessment - 08/28/15 1035    Subjective Pt states she has been performing her HEP at home and is having difficulty with stretching her big toe. She is having 5/10 pain stating it's a little bit better since her last visit.    Limitations Standing;Walking;House hold activities   Currently in Pain? Yes   Pain Score 5    Pain Location Heel   Pain Orientation Right   Pain Descriptors / Indicators Tender  stinging at night   Pain Type Chronic pain   Pain Onset More than a month ago   Pain Frequency Constant   Aggravating Factors  improper shoes   Pain Relieving Factors heat   Effect of Pain on Daily Activities walking long distances                         [facility] Adult PT Treatment/Exercise  - 08/28/15 0001    Manual Therapy   Manual Therapy Soft tissue mobilization;Joint mobilization   Manual therapy comments done seperately from all other aspects of treatment    Joint Mobilization Grade I-IV AP talocrural jt mobs in    Soft tissue mobilization TrP release and palmar spreading along Lateral gastroc and peroneals.   Ankle Exercises: Stretches   Plantar Fascia Stretch 3 reps   Soleus Stretch 3 reps;30 seconds   Gastroc Stretch 3 reps;30 seconds   Other Stretch piriformis stretch RLE 3x30sec   Ankle Exercises: Standing   Other Standing Ankle Exercises RLE eccentric lowering from 6" step, 1 UE support x10 reps                PT Education - 08/28/15 1127    Education provided Yes   Education Details reviewed/updated HEP    Person(s) Educated Patient   Methods Explanation;Demonstration;Handout   Comprehension Verbalized understanding;Returned demonstration          PT Short Term Goals - 08/09/15 1337    PT SHORT TERM GOAL #1   Title Pt to be independent with home exercise program to allow increased  range of motion of her right  ankle to improve gait mechanics.    Time 4   Period Weeks   Status New   PT SHORT TERM GOAL #2   Title Pt right heel  pain to be no greater than a 5/10 in the mornings when she goes to walk to decrease risk of falling    Time 4   Period Weeks   Status New   PT SHORT TERM GOAL #3   Title Pt right plantarflexion strength to increase to 4/10 to allow pt to state that she is able to descend steps without difficulty.   Time 4   Period Weeks   Status New   PT SHORT TERM GOAL #4   Title Pt balance improved to at least 30 second single leg stance to allow pt to be confident walking over uneven grass areas.    Time 4   Period Weeks   Status New           PT Long Term Goals - 08/09/15 1342    PT LONG TERM GOAL #1   Title Pt Right ankle ROM to be 12-15 degrees to allow pt to demonstrate normal gait pattern with good heel strike and  toe push off with equal strides to allow pt to ambulate for over an hour without noticing any limping behaviors.    Time 8   Period Weeks   Status New   PT LONG TERM GOAL #2   Title Pt  right ankle pain level to be 3 or less when patient first gets out of bed for decreased risk of falling    Time 8   Period Weeks   Status New   PT LONG TERM GOAL #3   Title Pt right gastrocnemius musculatrue to be at least a 4+/5 to allow pt to be able to rise on her toes with no symptoms to place an object weighing 15# or less into a high cabinet.    Time 8   Period Weeks   Status New   PT LONG TERM GOAL #4   Title Pt to be able to verbalize self help treatments if symptoms return including but not limited to friction massage, stretching and icing    Time 8   Period Weeks   Status New   PT LONG TERM GOAL #5   Title Pt to be able to complete a single leg stance for 45 seconds on both legs to feel confident walking on grassy inclines and declines    Time 8   Period Weeks   Status New               Plan - 08/28/15 1130    Clinical Impression Statement Today's session focused on therex to improve Rt LE ROM as well as STM to decreased soft tissue restrictions throughout her lateral calf. Pt demonstrating limited closed chain DF and big toe extension which is altering his mechanics during gait and other standing activity with increased activation of her lateral gastroc and peroneals. Therapist reviewed initial HEP with pt demonstrating good understanding by the end of today's session. Will continue with current POC.   Rehab Potential Good   PT Frequency 2x / week   PT Duration 8 weeks   PT Treatment/Interventions ADLs/Self Care Home Management;Cryotherapy;Electrical Stimulation;Iontophoresis '4mg'$ /ml Dexamethasone;Moist Heat;Ultrasound;Gait training;Therapeutic activities;Therapeutic exercise;Balance training;Neuromuscular re-education;Patient/family education;Manual techniques;Passive range of motion    PT Next Visit Plan continue manual as needed, closed chain DF mobs; address hip limitations of flexibility and eccentric therex for Rt gastroc  PT Home Exercise Plan reviewed and updated with piriformis   Consulted and Agree with Plan of Care Patient      Patient will benefit from skilled therapeutic intervention in order to improve the following deficits and impairments:  Abnormal gait, Decreased activity tolerance, Decreased balance, Pain, Decreased strength, Increased fascial restricitons, Increased muscle spasms, Decreased range of motion  Visit Diagnosis: Plantar fasciitis of right foot  Difficulty walking down stairs  Abnormality of gait  Stiffness of ankle joint, right  Weakness of foot, right     Problem List Patient Active Problem List   Diagnosis Date Noted  . Achilles tendonitis 10/04/2013  . Metatarsal fracture 03/13/2011   6:47 PM,08/28/2015 Elly Modena PT, DPT [facility] [place] [address] [place], [place], [address] Phone: [phone]   Fax:  [phone]  Name: Carol Byrd MRN: [id number] Date of Birth: March 04, 1946

## 2015-08-30 ENCOUNTER — Ambulatory Visit (HOSPITAL_COMMUNITY): Payer: Medicare Other | Admitting: Physical Therapy

## 2015-08-30 DIAGNOSIS — R262 Difficulty in walking, not elsewhere classified: Secondary | ICD-10-CM

## 2015-08-30 DIAGNOSIS — M25571 Pain in right ankle and joints of right foot: Secondary | ICD-10-CM

## 2015-08-30 DIAGNOSIS — M25671 Stiffness of right ankle, not elsewhere classified: Secondary | ICD-10-CM | POA: Diagnosis not present

## 2015-08-30 DIAGNOSIS — R269 Unspecified abnormalities of gait and mobility: Secondary | ICD-10-CM | POA: Diagnosis not present

## 2015-08-30 DIAGNOSIS — M6281 Muscle weakness (generalized): Secondary | ICD-10-CM | POA: Diagnosis not present

## 2015-08-30 DIAGNOSIS — M722 Plantar fascial fibromatosis: Secondary | ICD-10-CM | POA: Diagnosis not present

## 2015-08-30 DIAGNOSIS — M2141 Flat foot [pes planus] (acquired), right foot: Secondary | ICD-10-CM | POA: Diagnosis not present

## 2015-08-30 NOTE — Therapy (Signed)
[place], [place], [address] Phone: [phone]   Fax:  [phone]  Physical Therapy Treatment  Patient Details  Name: Carol Byrd MRN: [id number] Date of Birth: April 24, 1946 Referring Provider: Juliann Mule  Encounter Date: 08/30/2015      PT End of Session - 08/30/15 1110    Visit Number 4   Number of Visits 16   Date for PT Re-Evaluation 09/08/15   Authorization Type [employer] medicare    Authorization - Visit Number 4   Authorization - Number of Visits 10   PT Start Time 2671   PT Stop Time 1120   PT Time Calculation (min) 42 min   Activity Tolerance Patient tolerated treatment well      Past Medical History  Diagnosis Date  . High blood pressure   . High cholesterol     Past Surgical History  Procedure Laterality Date  . Thyroidectomy    . Cataracts    . Retinal tear repair cryotherapy    . Retinal peel    . Colonoscopy N/A 02/14/2015    Procedure: COLONOSCOPY;  Surgeon: Rogene Houston, MD;  [facility];  Service: Endoscopy;  Laterality: N/A;  1200    There were no vitals filed for this visit.      Subjective Assessment - 08/30/15 1040    Subjective Pt states that she is not in any pain at this time.    PT states that yesterday she had some pain.   She can tell that she is not having as much night pain    Limitations Standing;Walking;House hold activities   How long can you sit comfortably? no problem    How long can you stand comfortably? no problem   How long can you walk comfortably? There is always a pull; after sitting and getting up is difficulty.    Patient Stated Goals no pain                 [facility] Adult PT Treatment/Exercise - 08/30/15 0001    Manual Therapy   Manual Therapy Soft tissue mobilization;Joint mobilization   Manual therapy comments done seperately from all other aspects of treatment    Joint Mobilization Grade I-IV AP talocrural jt mobs in     Soft tissue mobilization TrP release and palmar spreading along Lateral gastroc and peroneals.   Ankle Exercises: Stretches   Plantar Fascia Stretch 3 reps   Gastroc Stretch 3 reps;30 seconds   Slant Board Stretch 3 reps;30 seconds   Ankle Exercises: Standing   BAPS Standing;Level 3;10 reps   SLS bilateral x 5 reps    Other Standing Ankle Exercises RLE eccentric lowering from 6" step, 1 UE support x10 reps   Ankle Exercises: Aerobic   Stationary Bike 5' for warm up prior to stretches.    Ankle Exercises: Machines for Strengthening   Cybex Leg Press 2 pl x 10                 PT Education - 08/30/15 1214    Education provided Yes   Education Details BaPS and cybex eccentric work   [employer]) Educated Patient   Methods Explanation;Demonstration   Comprehension Verbalized understanding;Returned demonstration          PT Short Term Goals - 08/09/15 1337    PT SHORT TERM GOAL #1   Title Pt to be independent with home exercise program to allow increased  range of motion of her  right ankle to improve gait mechanics.    Time 4   Period Weeks   Status New   PT SHORT TERM GOAL #2   Title Pt right heel  pain to be no greater than a 5/10 in the mornings when she goes to walk to decrease risk of falling    Time 4   Period Weeks   Status New   PT SHORT TERM GOAL #3   Title Pt right plantarflexion strength to increase to 4/10 to allow pt to state that she is able to descend steps without difficulty.   Time 4   Period Weeks   Status New   PT SHORT TERM GOAL #4   Title Pt balance improved to at least 30 second single leg stance to allow pt to be confident walking over uneven grass areas.    Time 4   Period Weeks   Status New           PT Long Term Goals - 08/09/15 1342    PT LONG TERM GOAL #1   Title Pt Right ankle ROM to be 12-15 degrees to allow pt to demonstrate normal gait pattern with good heel strike and toe push off with equal strides to allow pt to ambulate for  over an hour without noticing any limping behaviors.    Time 8   Period Weeks   Status New   PT LONG TERM GOAL #2   Title Pt  right ankle pain level to be 3 or less when patient first gets out of bed for decreased risk of falling    Time 8   Period Weeks   Status New   PT LONG TERM GOAL #3   Title Pt right gastrocnemius musculatrue to be at least a 4+/5 to allow pt to be able to rise on her toes with no symptoms to place an object weighing 15# or less into a high cabinet.    Time 8   Period Weeks   Status New   PT LONG TERM GOAL #4   Title Pt to be able to verbalize self help treatments if symptoms return including but not limited to friction massage, stretching and icing    Time 8   Period Weeks   Status New   PT LONG TERM GOAL #5   Title Pt to be able to complete a single leg stance for 45 seconds on both legs to feel confident walking on grassy inclines and declines    Time 8   Period Weeks   Status New               Plan - 08/30/15 1214    Clinical Impression Statement [name] is demonstrating improved but not normal ROM,  Began sterngthing this session ending with manual to decrese resriction thoughout both medial and lateral calf.     Rehab Potential Good   PT Frequency 2x / week   PT Duration 8 weeks   PT Treatment/Interventions ADLs/Self Care Home Management;Cryotherapy;Electrical Stimulation;Iontophoresis '4mg'$ /ml Dexamethasone;Moist Heat;Ultrasound;Gait training;Therapeutic activities;Therapeutic exercise;Balance training;Neuromuscular re-education;Patient/family education;Manual techniques;Passive range of motion   PT Next Visit Plan begin toe raises next session       Patient will benefit from skilled therapeutic intervention in order to improve the following deficits and impairments:  Abnormal gait, Decreased activity tolerance, Decreased balance, Pain, Decreased strength, Increased fascial restricitons, Increased muscle spasms, Decreased range of  motion  Visit Diagnosis: Pain in right ankle and joints of right foot - Plan: PT plan  of care cert/re-cert  Difficulty in walking, not elsewhere classified - Plan: PT plan of care cert/re-cert  Stiffness of right ankle, not elsewhere classified - Plan: PT plan of care cert/re-cert  Muscle weakness (generalized) - Plan: PT plan of care cert/re-cert     Problem List Patient Active Problem List   Diagnosis Date Noted  . Achilles tendonitis 10/04/2013  . Metatarsal fracture 03/13/2011   Rayetta Humphrey, PT CLT [phone] 08/30/2015, 12:40 PM  [facility] [address] [place], [place], [address] Phone: [phone]   Fax:  [phone]  Name: Carol Byrd MRN: [id number] Date of Birth: [birth year]

## 2015-09-04 ENCOUNTER — Ambulatory Visit (HOSPITAL_COMMUNITY): Payer: Medicare Other | Admitting: Physical Therapy

## 2015-09-04 DIAGNOSIS — R262 Difficulty in walking, not elsewhere classified: Secondary | ICD-10-CM

## 2015-09-04 DIAGNOSIS — M722 Plantar fascial fibromatosis: Secondary | ICD-10-CM | POA: Diagnosis not present

## 2015-09-04 DIAGNOSIS — M6281 Muscle weakness (generalized): Secondary | ICD-10-CM | POA: Diagnosis not present

## 2015-09-04 DIAGNOSIS — R269 Unspecified abnormalities of gait and mobility: Secondary | ICD-10-CM | POA: Diagnosis not present

## 2015-09-04 DIAGNOSIS — M25671 Stiffness of right ankle, not elsewhere classified: Secondary | ICD-10-CM | POA: Diagnosis not present

## 2015-09-04 DIAGNOSIS — M25571 Pain in right ankle and joints of right foot: Secondary | ICD-10-CM | POA: Diagnosis not present

## 2015-09-04 DIAGNOSIS — M2141 Flat foot [pes planus] (acquired), right foot: Secondary | ICD-10-CM | POA: Diagnosis not present

## 2015-09-04 NOTE — Therapy (Signed)
[place], [place], [address] Phone: [phone]   Fax:  [phone]  Physical Therapy Treatment  Patient Details  Name: Carol Byrd MRN: [id number] Date of Birth: [birth year] Referring Provider: Juliann Mule  Encounter Date: 09/04/2015      PT End of Session - 09/04/15 1342    Visit Number 5   Number of Visits 16   Date for PT Re-Evaluation 09/08/15   Authorization Type [employer] medicare    Authorization - Visit Number 5   Authorization - Number of Visits 10   PT Start Time 6948   PT Stop Time 1345   PT Time Calculation (min) 43 min   Activity Tolerance Patient tolerated treatment well   Behavior During Therapy [facility] for tasks assessed/performed      Past Medical History  Diagnosis Date  . High blood pressure   . High cholesterol     Past Surgical History  Procedure Laterality Date  . Thyroidectomy    . Cataracts    . Retinal tear repair cryotherapy    . Retinal peel    . Colonoscopy N/A 02/14/2015    Procedure: COLONOSCOPY;  Surgeon: Rogene Houston, MD;  [facility];  Service: Endoscopy;  Laterality: N/A;  1200    There were no vitals filed for this visit.      Subjective Assessment - 09/04/15 1308    Subjective Pt states her heel has been a little more sore the past couple of days. It is a little sore today, but her tennis shoes are helping. Thinks it may be from going to baseball games the other day. She has been doing her exercises at home regularly.    Limitations Standing;Walking;House hold activities   Currently in Pain? Yes   Pain Score 6    Pain Location Heel   Pain Orientation Right   Pain Descriptors / Indicators Other (Comment)  tender   Pain Type Chronic pain   Pain Onset More than a month ago   Pain Frequency Constant   Aggravating Factors  improper shoes   Pain Relieving Factors exercises   Effect of Pain on Daily Activities walking long distances                          [facility] Adult PT Treatment/Exercise - 09/04/15 0001    Manual Therapy   Manual Therapy Soft tissue mobilization;Joint mobilization   Manual therapy comments done seperately from all other aspects of treatment    Joint Mobilization Grade I-IV AP talocrural jt mobs in supine, Grade I-IV PA to MTP of Rt big toe   Soft tissue mobilization crossfriction massage to L Rt distal achilles tendon    Ankle Exercises: Aerobic   Stationary Bike 5' for warm up prior to stretches.    Ankle Exercises: Stretches   Other Stretch piriformis stretch BLE 3x30sec   Ankle Exercises: Standing   SLS bilateral x 5 reps   20 sec each; on foam Rt: x7 sec, x15 sec; Lt: x10 sec max    Other Standing Ankle Exercises RLE eccentric lowering from 6" step, 1 UE support x15 reps   Other Standing Ankle Exercises tandem Rt: 20 sec max, Lt: 30 sec  increased difficulty with RLE forward                PT Education - 09/04/15 1341    Education provided Yes   Education Details Continued HEP  adherence; reviewed technique with plantar fascia stretch   Person(s) Educated Patient   Methods Explanation;Demonstration   Comprehension Verbalized understanding;Returned demonstration          PT Short Term Goals - 08/09/15 1337    PT SHORT TERM GOAL #1   Title Pt to be independent with home exercise program to allow increased  range of motion of her right ankle to improve gait mechanics.    Time 4   Period Weeks   Status New   PT SHORT TERM GOAL #2   Title Pt right heel  pain to be no greater than a 5/10 in the mornings when she goes to walk to decrease risk of falling    Time 4   Period Weeks   Status New   PT SHORT TERM GOAL #3   Title Pt right plantarflexion strength to increase to 4/10 to allow pt to state that she is able to descend steps without difficulty.   Time 4   Period Weeks   Status New   PT SHORT TERM GOAL #4   Title Pt balance improved to at least 30 second  single leg stance to allow pt to be confident walking over uneven grass areas.    Time 4   Period Weeks   Status New           PT Long Term Goals - 08/09/15 1342    PT LONG TERM GOAL #1   Title Pt Right ankle ROM to be 12-15 degrees to allow pt to demonstrate normal gait pattern with good heel strike and toe push off with equal strides to allow pt to ambulate for over an hour without noticing any limping behaviors.    Time 8   Period Weeks   Status New   PT LONG TERM GOAL #2   Title Pt  right ankle pain level to be 3 or less when patient first gets out of bed for decreased risk of falling    Time 8   Period Weeks   Status New   PT LONG TERM GOAL #3   Title Pt right gastrocnemius musculatrue to be at least a 4+/5 to allow pt to be able to rise on her toes with no symptoms to place an object weighing 15# or less into a high cabinet.    Time 8   Period Weeks   Status New   PT LONG TERM GOAL #4   Title Pt to be able to verbalize self help treatments if symptoms return including but not limited to friction massage, stretching and icing    Time 8   Period Weeks   Status New   PT LONG TERM GOAL #5   Title Pt to be able to complete a single leg stance for 45 seconds on both legs to feel confident walking on grassy inclines and declines    Time 8   Period Weeks   Status New               Plan - 09/04/15 1634    Clinical Impression Statement Today's session focused on manual techniques and therex to improve Rt LE ROM as well as balance activity for improved safety. Pt demonstrating overall improved balance this session with both tandem and SLS, however she continues to present with limited first toe extension and ankle DF ROM. Therapist discussed HEP and importance of adherence to the program with pt verbalizing understanding. Will continue with current POC.   Rehab Potential Good  PT Frequency 2x / week   PT Duration 8 weeks   PT Treatment/Interventions ADLs/Self Care Home  Management;Cryotherapy;Electrical Stimulation;Iontophoresis '4mg'$ /ml Dexamethasone;Moist Heat;Ultrasound;Gait training;Therapeutic activities;Therapeutic exercise;Balance training;Neuromuscular re-education;Patient/family education;Manual techniques;Passive range of motion   PT Next Visit Plan begin toe raises next session; closed chain DF mobilization   PT Home Exercise Plan no changes this session   Consulted and Agree with Plan of Care Patient      Patient will benefit from skilled therapeutic intervention in order to improve the following deficits and impairments:  Abnormal gait, Decreased activity tolerance, Decreased balance, Pain, Decreased strength, Increased fascial restricitons, Increased muscle spasms, Decreased range of motion  Visit Diagnosis: Difficulty in walking, not elsewhere classified  Stiffness of right ankle, not elsewhere classified  Muscle weakness (generalized)  Pain in right ankle and joints of right foot     Problem List Patient Active Problem List   Diagnosis Date Noted  . Achilles tendonitis 10/04/2013  . Metatarsal fracture 03/13/2011   4:41 PM,09/04/2015 Elly Modena PT, DPT [facility] [place] [address] [place], [place], [address] Phone: [phone]   Fax:  [phone]  Name: Carol Byrd MRN: [id number] Date of Birth: 06-30-1945

## 2015-09-05 DIAGNOSIS — L659 Nonscarring hair loss, unspecified: Secondary | ICD-10-CM | POA: Diagnosis not present

## 2015-09-05 DIAGNOSIS — E0590 Thyrotoxicosis, unspecified without thyrotoxic crisis or storm: Secondary | ICD-10-CM | POA: Diagnosis not present

## 2015-09-05 DIAGNOSIS — L658 Other specified nonscarring hair loss: Secondary | ICD-10-CM | POA: Diagnosis not present

## 2015-09-05 DIAGNOSIS — K122 Cellulitis and abscess of mouth: Secondary | ICD-10-CM | POA: Diagnosis not present

## 2015-09-05 DIAGNOSIS — L821 Other seborrheic keratosis: Secondary | ICD-10-CM | POA: Diagnosis not present

## 2015-09-05 DIAGNOSIS — I781 Nevus, non-neoplastic: Secondary | ICD-10-CM | POA: Diagnosis not present

## 2015-09-05 DIAGNOSIS — E039 Hypothyroidism, unspecified: Secondary | ICD-10-CM | POA: Diagnosis not present

## 2015-09-06 ENCOUNTER — Ambulatory Visit (HOSPITAL_COMMUNITY): Payer: Medicare Other | Admitting: Physical Therapy

## 2015-09-06 DIAGNOSIS — R262 Difficulty in walking, not elsewhere classified: Secondary | ICD-10-CM | POA: Diagnosis not present

## 2015-09-06 DIAGNOSIS — M6281 Muscle weakness (generalized): Secondary | ICD-10-CM

## 2015-09-06 DIAGNOSIS — M2141 Flat foot [pes planus] (acquired), right foot: Secondary | ICD-10-CM | POA: Diagnosis not present

## 2015-09-06 DIAGNOSIS — M25671 Stiffness of right ankle, not elsewhere classified: Secondary | ICD-10-CM | POA: Diagnosis not present

## 2015-09-06 DIAGNOSIS — R269 Unspecified abnormalities of gait and mobility: Secondary | ICD-10-CM | POA: Diagnosis not present

## 2015-09-06 DIAGNOSIS — M25571 Pain in right ankle and joints of right foot: Secondary | ICD-10-CM | POA: Diagnosis not present

## 2015-09-06 DIAGNOSIS — M722 Plantar fascial fibromatosis: Secondary | ICD-10-CM | POA: Diagnosis not present

## 2015-09-06 NOTE — Therapy (Signed)
[place], [place], [address] Phone: [phone]   Fax:  [phone]  Physical Therapy Treatment  Patient Details  Name: Carol Byrd MRN: [id number] Date of Birth: [birth year] Referring Provider: Juliann Mule  Encounter Date: 09/06/2015      PT End of Session - 09/06/15 1429    Visit Number 6   Number of Visits 16   Date for PT Re-Evaluation 09/08/15   Authorization Type [employer] medicare    Authorization - Visit Number 6   Authorization - Number of Visits 10   PT Start Time 1300   PT Stop Time 1355   PT Time Calculation (min) 55 min   Activity Tolerance Patient tolerated treatment well   Behavior During Therapy [facility] for tasks assessed/performed      Past Medical History  Diagnosis Date  . High blood pressure   . High cholesterol     Past Surgical History  Procedure Laterality Date  . Thyroidectomy    . Cataracts    . Retinal tear repair cryotherapy    . Retinal peel    . Colonoscopy N/A 02/14/2015    Procedure: COLONOSCOPY;  Surgeon: Rogene Houston, MD;  [facility];  Service: Endoscopy;  Laterality: N/A;  1200    There were no vitals filed for this visit.      Subjective Assessment - 09/06/15 1410    Subjective Pt states she mainly has pain when she first gets up and after prolonged sitting.  No plantar foot pain, only posterior achilles pain.   Currently in Pain? Yes   Pain Score 4    Pain Location Heel                         [facility] Adult PT Treatment/Exercise - 09/06/15 1426    Manual Therapy   Manual Therapy Soft tissue mobilization;Joint mobilization   Manual therapy comments done seperately from all other aspects of treatment    Joint Mobilization Grade I-IV AP talocrural jt mobs in supine   Soft tissue mobilization crossfriction massage to L Rt distal achilles tendon    Ankle Exercises: Aerobic   Stationary Bike 5' for warm up prior to stretches.     Ankle Exercises: Stretches   Plantar Fascia Stretch 3 reps;30 seconds   Soleus Stretch 3 reps;30 seconds   Slant Board Stretch 3 reps;30 seconds   Ankle Exercises: Standing   SLS bilateral x 2 reps   once on solid surface, once on foam   Toe Raise 20 reps   Other Standing Ankle Exercises tandem 2X 30" each lead on foam                  PT Short Term Goals - 09/06/15 1435    PT SHORT TERM GOAL #1   Title Pt to be independent with home exercise program to allow increased  range of motion of her right ankle to improve gait mechanics.    Time 4   Period Weeks   Status On-going   PT SHORT TERM GOAL #2   Title Pt right heel  pain to be no greater than a 5/10 in the mornings when she goes to walk to decrease risk of falling    Time 4   Period Weeks   Status On-going   PT SHORT TERM GOAL #3   Title Pt right plantarflexion strength to increase to 4/10 to allow pt to state  that she is able to descend steps without difficulty.   Time 4   Period Weeks   Status On-going   PT SHORT TERM GOAL #4   Title Pt balance improved to at least 30 second single leg stance to allow pt to be confident walking over uneven grass areas.    Time 4   Period Weeks   Status On-going           PT Long Term Goals - 09/06/15 1435    PT LONG TERM GOAL #1   Title Pt Right ankle ROM to be 12-15 degrees to allow pt to demonstrate normal gait pattern with good heel strike and toe push off with equal strides to allow pt to ambulate for over an hour without noticing any limping behaviors.    Time 8   Period Weeks   Status On-going   PT LONG TERM GOAL #2   Title Pt  right ankle pain level to be 3 or less when patient first gets out of bed for decreased risk of falling    Time 8   Period Weeks   Status On-going   PT LONG TERM GOAL #3   Title Pt right gastrocnemius musculatrue to be at least a 4+/5 to allow pt to be able to rise on her toes with no symptoms to place an object weighing 15# or less into  a high cabinet.    Time 8   Period Weeks   Status On-going   PT LONG TERM GOAL #4   Title Pt to be able to verbalize self help treatments if symptoms return including but not limited to friction massage, stretching and icing    Time 8   Period Weeks   Status On-going   PT LONG TERM GOAL #5   Title Pt to be able to complete a single leg stance for 45 seconds on both legs to feel confident walking on grassy inclines and declines    Time 8   Period Weeks   Status On-going               Plan - 09/06/15 1435    Clinical Impression Statement Continued focus on improving Rt ankle ROM and general mobility via stetches and manual techniques.  Pt with increased difficulty completing SLS today, especially on Right today with inability to complete on foam.  Was able to maintain balance with tandem stance on foam for full 30" with each LE lead.  Pt reported overall improvment at end of session without pain reported and no antalgia.     Rehab Potential Good   PT Frequency 2x / week   PT Duration 8 weeks   PT Treatment/Interventions ADLs/Self Care Home Management;Cryotherapy;Electrical Stimulation;Iontophoresis '4mg'$ /ml Dexamethasone;Moist Heat;Ultrasound;Gait training;Therapeutic activities;Therapeutic exercise;Balance training;Neuromuscular re-education;Patient/family education;Manual techniques;Passive range of motion   PT Next Visit Plan Progress closed chain stability therex.  Add vector stance next session.    PT Home Exercise Plan no changes this session   Consulted and Agree with Plan of Care Patient      Patient will benefit from skilled therapeutic intervention in order to improve the following deficits and impairments:  Abnormal gait, Decreased activity tolerance, Decreased balance, Pain, Decreased strength, Increased fascial restricitons, Increased muscle spasms, Decreased range of motion  Visit Diagnosis: Difficulty in walking, not elsewhere classified  Stiffness of right ankle,  not elsewhere classified  Muscle weakness (generalized)  Pain in right ankle and joints of right foot     Problem List Patient Active Problem  List   Diagnosis Date Noted  . Achilles tendonitis 10/04/2013  . Metatarsal fracture 03/13/2011    Teena Irani, PTA/CLT [phone] 09/06/2015, 2:40 PM  [place], [place], [address] Phone: [phone]   Fax:  [phone]  Name: Carol Byrd MRN: [id number] Date of Birth: [birth year]

## 2015-09-07 ENCOUNTER — Other Ambulatory Visit (HOSPITAL_COMMUNITY): Payer: Self-pay | Admitting: Internal Medicine

## 2015-09-07 DIAGNOSIS — Z1231 Encounter for screening mammogram for malignant neoplasm of breast: Secondary | ICD-10-CM

## 2015-09-11 ENCOUNTER — Ambulatory Visit (HOSPITAL_COMMUNITY): Payer: Medicare Other | Admitting: Physical Therapy

## 2015-09-11 DIAGNOSIS — M25671 Stiffness of right ankle, not elsewhere classified: Secondary | ICD-10-CM | POA: Diagnosis not present

## 2015-09-11 DIAGNOSIS — M6281 Muscle weakness (generalized): Secondary | ICD-10-CM | POA: Diagnosis not present

## 2015-09-11 DIAGNOSIS — M2141 Flat foot [pes planus] (acquired), right foot: Secondary | ICD-10-CM | POA: Diagnosis not present

## 2015-09-11 DIAGNOSIS — R269 Unspecified abnormalities of gait and mobility: Secondary | ICD-10-CM | POA: Diagnosis not present

## 2015-09-11 DIAGNOSIS — M25571 Pain in right ankle and joints of right foot: Secondary | ICD-10-CM

## 2015-09-11 DIAGNOSIS — R262 Difficulty in walking, not elsewhere classified: Secondary | ICD-10-CM | POA: Diagnosis not present

## 2015-09-11 DIAGNOSIS — M722 Plantar fascial fibromatosis: Secondary | ICD-10-CM | POA: Diagnosis not present

## 2015-09-11 NOTE — Therapy (Signed)
[place], [place], [address] Phone: [phone]   Fax:  [phone]  Physical Therapy Treatment  Patient Details  Name: Carol Byrd MRN: [id number] Date of Birth: 01-Dec-1945 Referring Provider: Juliann Mule  Encounter Date: 09/11/2015      PT End of Session - 09/11/15 1409    Visit Number 7   Number of Visits 16   Date for PT Re-Evaluation 09/08/15   Authorization Type [employer] medicare    Authorization - Visit Number 7   Authorization - Number of Visits 10   PT Start Time 1350   PT Stop Time 1435   PT Time Calculation (min) 45 min      Past Medical History  Diagnosis Date  . High blood pressure   . High cholesterol     Past Surgical History  Procedure Laterality Date  . Thyroidectomy    . Cataracts    . Retinal tear repair cryotherapy    . Retinal peel    . Colonoscopy N/A 02/14/2015    Procedure: COLONOSCOPY;  Surgeon: Rogene Houston, MD;  [facility];  Service: Endoscopy;  Laterality: N/A;  1200    There were no vitals filed for this visit.      Subjective Assessment - 09/11/15 1357    Subjective Pt states that she can tell the biggest difference.  She is using a night splint in the evening.   Limitations Standing;Walking;House hold activities   Currently in Pain? Yes   Pain Score 2    Pain Location Ankle   Pain Orientation Right   Pain Descriptors / Indicators Aching   Pain Type Chronic pain   Pain Onset More than a month ago   Pain Frequency Constant                         [facility] Adult PT Treatment/Exercise - 09/11/15 0001    Manual Therapy   Manual Therapy Soft tissue mobilization;Joint mobilization   Manual therapy comments done seperately from all other aspects of treatment    Joint Mobilization Grade I-IV AP talocrural jt mobs in supine   Soft tissue mobilization crossfriction massage to L Rt distal achilles tendon    Ankle Exercises: Aerobic    Stationary Bike 5' for warm up prior to stretches.    Ankle Exercises: Stretches   Plantar Fascia Stretch 3 reps;30 seconds   Slant Board Stretch 3 reps;30 seconds   Ankle Exercises: Standing   BAPS Standing;Level 3   SLS --  once on solid surface, once on foam   Toe Raise 20 reps   Other Standing Ankle Exercises tandem 2X 30" each lead on foam   Ankle Exercises: Machines for Strengthening   Cybex Leg Press 2 pl x 10                   PT Short Term Goals - 09/06/15 1435    PT SHORT TERM GOAL #1   Title Pt to be independent with home exercise program to allow increased  range of motion of her right ankle to improve gait mechanics.    Time 4   Period Weeks   Status On-going   PT SHORT TERM GOAL #2   Title Pt right heel  pain to be no greater than a 5/10 in the mornings when she goes to walk to decrease risk of falling    Time 4   Period Weeks  Status On-going   PT SHORT TERM GOAL #3   Title Pt right plantarflexion strength to increase to 4/10 to allow pt to state that she is able to descend steps without difficulty.   Time 4   Period Weeks   Status On-going   PT SHORT TERM GOAL #4   Title Pt balance improved to at least 30 second single leg stance to allow pt to be confident walking over uneven grass areas.    Time 4   Period Weeks   Status On-going           PT Long Term Goals - 09/06/15 1435    PT LONG TERM GOAL #1   Title Pt Right ankle ROM to be 12-15 degrees to allow pt to demonstrate normal gait pattern with good heel strike and toe push off with equal strides to allow pt to ambulate for over an hour without noticing any limping behaviors.    Time 8   Period Weeks   Status On-going   PT LONG TERM GOAL #2   Title Pt  right ankle pain level to be 3 or less when patient first gets out of bed for decreased risk of falling    Time 8   Period Weeks   Status On-going   PT LONG TERM GOAL #3   Title Pt right gastrocnemius musculatrue to be at least a  4+/5 to allow pt to be able to rise on her toes with no symptoms to place an object weighing 15# or less into a high cabinet.    Time 8   Period Weeks   Status On-going   PT LONG TERM GOAL #4   Title Pt to be able to verbalize self help treatments if symptoms return including but not limited to friction massage, stretching and icing    Time 8   Period Weeks   Status On-going   PT LONG TERM GOAL #5   Title Pt to be able to complete a single leg stance for 45 seconds on both legs to feel confident walking on grassy inclines and declines    Time 8   Period Weeks   Status On-going               Plan - 09/11/15 1413    Clinical Impression Statement Pt continues to sleep in the boot when she goes without the boot she can tell a difference.  PT Continues to have  balance and slight strength  deficits which will benefit from skilled physical therapy.    Rehab Potential Good   PT Frequency 2x / week   PT Duration 8 weeks   PT Next Visit Plan begin stair climbing next treatment.       Patient will benefit from skilled therapeutic intervention in order to improve the following deficits and impairments:  Abnormal gait, Decreased activity tolerance, Decreased balance, Pain, Decreased strength, Increased fascial restricitons, Increased muscle spasms, Decreased range of motion  Visit Diagnosis: Difficulty in walking, not elsewhere classified  Stiffness of right ankle, not elsewhere classified  Muscle weakness (generalized)  Pain in right ankle and joints of right foot     Problem List Patient Active Problem List   Diagnosis Date Noted  . Achilles tendonitis 10/04/2013  . Metatarsal fracture 03/13/2011  Rayetta Humphrey, PT CLT [phone] 09/11/2015, 2:34 PM  [place] [address] [place], [place], [address] Phone: [phone]   Fax:  [phone]  Name: Carol Byrd MRN: [id number]  Date of Birth: Nov 20, 1945

## 2015-09-14 ENCOUNTER — Ambulatory Visit (HOSPITAL_COMMUNITY): Payer: Medicare Other | Admitting: Physical Therapy

## 2015-09-14 DIAGNOSIS — M722 Plantar fascial fibromatosis: Secondary | ICD-10-CM | POA: Diagnosis not present

## 2015-09-14 DIAGNOSIS — M6281 Muscle weakness (generalized): Secondary | ICD-10-CM | POA: Diagnosis not present

## 2015-09-14 DIAGNOSIS — M25571 Pain in right ankle and joints of right foot: Secondary | ICD-10-CM | POA: Diagnosis not present

## 2015-09-14 DIAGNOSIS — M25671 Stiffness of right ankle, not elsewhere classified: Secondary | ICD-10-CM | POA: Diagnosis not present

## 2015-09-14 DIAGNOSIS — R269 Unspecified abnormalities of gait and mobility: Secondary | ICD-10-CM | POA: Diagnosis not present

## 2015-09-14 DIAGNOSIS — R262 Difficulty in walking, not elsewhere classified: Secondary | ICD-10-CM

## 2015-09-14 DIAGNOSIS — M2141 Flat foot [pes planus] (acquired), right foot: Secondary | ICD-10-CM | POA: Diagnosis not present

## 2015-09-14 NOTE — Therapy (Signed)
[place], [place], [address] Phone: [phone]   Fax:  [phone]  Physical Therapy Treatment  Patient Details  Name: Carol Byrd MRN: [id number] Date of Birth: 1945-11-23 Referring Provider: Juliann Mule  Encounter Date: 09/14/2015      PT End of Session - 09/14/15 1511    Visit Number 8   Number of Visits 16   Date for PT Re-Evaluation 09/08/15   Authorization Type [employer] medicare    Authorization - Visit Number 8   Authorization - Number of Visits 10   PT Start Time 0998   PT Stop Time 1345   PT Time Calculation (min) 40 min   Equipment Utilized During Treatment Gait belt   Activity Tolerance Patient tolerated treatment well   Behavior During Therapy [facility] for tasks assessed/performed      Past Medical History  Diagnosis Date  . High blood pressure   . High cholesterol     Past Surgical History  Procedure Laterality Date  . Thyroidectomy    . Cataracts    . Retinal tear repair cryotherapy    . Retinal peel    . Colonoscopy N/A 02/14/2015    Procedure: COLONOSCOPY;  Surgeon: Rogene Houston, MD;  [facility];  Service: Endoscopy;  Laterality: N/A;  1200    There were no vitals filed for this visit.      Subjective Assessment - 09/14/15 1309    Subjective pt states she is doing well. She says the parafin is helping. She is waiting to go back to her MD until after she is finished with therapy. She's doing her HEP every day.   Currently in Pain? No/denies  some soreness noted when standing                         [facility] Adult PT Treatment/Exercise - 09/14/15 0001    Manual Therapy   Manual Therapy Soft tissue mobilization;Joint mobilization   Manual therapy comments done seperately from all other aspects of treatment    Joint Mobilization Grade I-IV AP talocrural,distal MTP jt mobs in supine   Soft tissue mobilization crossfriction massage to L Rt distal  achilles tendon    Ankle Exercises: Aerobic   Stationary Bike 5' for warm up prior to stretches.    Ankle Exercises: Stretches   Slant Board Stretch 3 reps;30 seconds   Ankle Exercises: Standing   Rocker Board 2 minutes  forward/backward intermittent UE support   Other Standing Ankle Exercises hold with each LE forward and trunk rotation L/R x20                PT Education - 09/14/15 1511    Education provided Yes   Education Details HEP adherence atleast 1x/day   Person(s) Educated Patient   Methods Explanation   Comprehension Verbalized understanding          PT Short Term Goals - 09/06/15 1435    PT SHORT TERM GOAL #1   Title Pt to be independent with home exercise program to allow increased  range of motion of her right ankle to improve gait mechanics.    Time 4   Period Weeks   Status On-going   PT SHORT TERM GOAL #2   Title Pt right heel  pain to be no greater than a 5/10 in the mornings when she goes to walk to decrease risk of falling    Time 4  Period Weeks   Status On-going   PT SHORT TERM GOAL #3   Title Pt right plantarflexion strength to increase to 4/10 to allow pt to state that she is able to descend steps without difficulty.   Time 4   Period Weeks   Status On-going   PT SHORT TERM GOAL #4   Title Pt balance improved to at least 30 second single leg stance to allow pt to be confident walking over uneven grass areas.    Time 4   Period Weeks   Status On-going           PT Long Term Goals - 09/06/15 1435    PT LONG TERM GOAL #1   Title Pt Right ankle ROM to be 12-15 degrees to allow pt to demonstrate normal gait pattern with good heel strike and toe push off with equal strides to allow pt to ambulate for over an hour without noticing any limping behaviors.    Time 8   Period Weeks   Status On-going   PT LONG TERM GOAL #2   Title Pt  right ankle pain level to be 3 or less when patient first gets out of bed for decreased risk of falling     Time 8   Period Weeks   Status On-going   PT LONG TERM GOAL #3   Title Pt right gastrocnemius musculatrue to be at least a 4+/5 to allow pt to be able to rise on her toes with no symptoms to place an object weighing 15# or less into a high cabinet.    Time 8   Period Weeks   Status On-going   PT LONG TERM GOAL #4   Title Pt to be able to verbalize self help treatments if symptoms return including but not limited to friction massage, stretching and icing    Time 8   Period Weeks   Status On-going   PT LONG TERM GOAL #5   Title Pt to be able to complete a single leg stance for 45 seconds on both legs to feel confident walking on grassy inclines and declines    Time 8   Period Weeks   Status On-going               Plan - 09/14/15 1512    Clinical Impression Statement Today's session focused on manual techniques for improved soft tissue flexibility as well as dynamic balance activity. Pt demonstrating improved HEP adherence and feeling she is making much improvement. Discussed possibility of d/c next week if she continues to make improvements and not have any complaints.   Rehab Potential Good   PT Frequency 2x / week   PT Duration 8 weeks   PT Treatment/Interventions ADLs/Self Care Home Management;Cryotherapy;Electrical Stimulation;Iontophoresis '4mg'$ /ml Dexamethasone;Moist Heat;Ultrasound;Gait training;Therapeutic activities;Therapeutic exercise;Balance training;Neuromuscular re-education;Patient/family education;Manual techniques;Passive range of motion   PT Next Visit Plan begin stair climbing next treatment.    PT Home Exercise Plan no changes this session   Consulted and Agree with Plan of Care Patient      Patient will benefit from skilled therapeutic intervention in order to improve the following deficits and impairments:  Abnormal gait, Decreased activity tolerance, Decreased balance, Pain, Decreased strength, Increased fascial restricitons, Increased muscle spasms,  Decreased range of motion  Visit Diagnosis: Difficulty in walking, not elsewhere classified  Stiffness of right ankle, not elsewhere classified  Muscle weakness (generalized)  Pain in right ankle and joints of right foot     Problem List Patient Active  Problem List   Diagnosis Date Noted  . Achilles tendonitis 10/04/2013  . Metatarsal fracture 03/13/2011    4:00 PM,09/14/2015 Elly Modena PT, DPT [facility] [place] [address] [place], [place], [address] Phone: [phone]   Fax:  [phone]  Name: Carol Byrd MRN: [id number] Date of Birth: 1945/10/17

## 2015-09-18 ENCOUNTER — Ambulatory Visit (HOSPITAL_COMMUNITY): Payer: Medicare Other | Attending: Orthopaedic Surgery | Admitting: Physical Therapy

## 2015-09-18 DIAGNOSIS — M25571 Pain in right ankle and joints of right foot: Secondary | ICD-10-CM | POA: Diagnosis not present

## 2015-09-18 DIAGNOSIS — M25671 Stiffness of right ankle, not elsewhere classified: Secondary | ICD-10-CM | POA: Diagnosis not present

## 2015-09-18 DIAGNOSIS — R262 Difficulty in walking, not elsewhere classified: Secondary | ICD-10-CM

## 2015-09-18 DIAGNOSIS — M6281 Muscle weakness (generalized): Secondary | ICD-10-CM

## 2015-09-18 NOTE — Therapy (Signed)
[place], [place], [address] Phone: [phone]   Fax:  [phone]  Physical Therapy Treatment  Patient Details  Name: Carol Byrd MRN: [id number] Date of Birth: 10-Apr-1946 Referring Provider: Juliann Mule  Encounter Date: 09/18/2015      PT End of Session - 09/18/15 1438    Visit Number 9   Number of Visits 16   Date for PT Re-Evaluation 09/08/15   Authorization Type [employer] medicare    Authorization - Visit Number 9   Authorization - Number of Visits 10   PT Start Time 1300   PT Stop Time 1352   PT Time Calculation (min) 52 min   Equipment Utilized During Treatment Gait belt   Activity Tolerance Patient tolerated treatment well   Behavior During Therapy [facility] for tasks assessed/performed      Past Medical History  Diagnosis Date  . High blood pressure   . High cholesterol     Past Surgical History  Procedure Laterality Date  . Thyroidectomy    . Cataracts    . Retinal tear repair cryotherapy    . Retinal peel    . Colonoscopy N/A 02/14/2015    Procedure: COLONOSCOPY;  Surgeon: Rogene Houston, MD;  [facility];  Service: Endoscopy;  Laterality: N/A;  1200    There were no vitals filed for this visit.      Subjective Assessment - 09/18/15 1442    Subjective PT states she is improved.  States she usually doenst have pain first thing in the morning as she wears a boot to sleep in.  States she does have pain after prolonged inactivity that still bothers her.   Currently in Pain? No/denies                         [facility] Adult PT Treatment/Exercise - 09/18/15 0001    Modalities   Modalities Cryotherapy   Cryotherapy   Number Minutes Cryotherapy 5 Minutes   Cryotherapy Location Ankle  lateral achilles   Type of Cryotherapy Ice massage   Manual Therapy   Manual Therapy Soft tissue mobilization;Joint mobilization   Manual therapy comments done seperately from all  other aspects of treatment    Joint Mobilization Grade I-IV AP talocrural,distal MTP jt mobs in supine   Soft tissue mobilization crossfriction massage to L Rt distal achilles tendon    Ankle Exercises: Stretches   Plantar Fascia Stretch 3 reps;30 seconds   Slant Board Stretch 3 reps;30 seconds   Ankle Exercises: Aerobic   Stationary Bike 5' for warm up prior to stretches.                 PT Education - 09/18/15 1440    Education provided Yes   Education Details mechanism of ice massage   Person(s) Educated Patient   Methods Explanation   Comprehension Verbalized understanding          PT Short Term Goals - 09/06/15 1435    PT SHORT TERM GOAL #1   Title Pt to be independent with home exercise program to allow increased  range of motion of her right ankle to improve gait mechanics.    Time 4   Period Weeks   Status On-going   PT SHORT TERM GOAL #2   Title Pt right heel  pain to be no greater than a 5/10 in the mornings when she goes to walk to decrease risk of  falling    Time 4   Period Weeks   Status On-going   PT SHORT TERM GOAL #3   Title Pt right plantarflexion strength to increase to 4/10 to allow pt to state that she is able to descend steps without difficulty.   Time 4   Period Weeks   Status On-going   PT SHORT TERM GOAL #4   Title Pt balance improved to at least 30 second single leg stance to allow pt to be confident walking over uneven grass areas.    Time 4   Period Weeks   Status On-going           PT Long Term Goals - 09/06/15 1435    PT LONG TERM GOAL #1   Title Pt Right ankle ROM to be 12-15 degrees to allow pt to demonstrate normal gait pattern with good heel strike and toe push off with equal strides to allow pt to ambulate for over an hour without noticing any limping behaviors.    Time 8   Period Weeks   Status On-going   PT LONG TERM GOAL #2   Title Pt  right ankle pain level to be 3 or less when patient first gets out of bed for  decreased risk of falling    Time 8   Period Weeks   Status On-going   PT LONG TERM GOAL #3   Title Pt right gastrocnemius musculatrue to be at least a 4+/5 to allow pt to be able to rise on her toes with no symptoms to place an object weighing 15# or less into a high cabinet.    Time 8   Period Weeks   Status On-going   PT LONG TERM GOAL #4   Title Pt to be able to verbalize self help treatments if symptoms return including but not limited to friction massage, stretching and icing    Time 8   Period Weeks   Status On-going   PT LONG TERM GOAL #5   Title Pt to be able to complete a single leg stance for 45 seconds on both legs to feel confident walking on grassy inclines and declines    Time 8   Period Weeks   Status On-going               Plan - 09/18/15 1438    Clinical Impression Statement continued focus on manual techniques and stretches to decrease pain and irritation.  Spent large amount of time on manual to achilles and calf musculture and followed by ice massage to lateral achilles region.  Pt reported overall relief at end of session.      Rehab Potential Good   PT Frequency 2x / week   PT Duration 8 weeks   PT Treatment/Interventions ADLs/Self Care Home Management;Cryotherapy;Electrical Stimulation;Iontophoresis '4mg'$ /ml Dexamethasone;Moist Heat;Ultrasound;Gait training;Therapeutic activities;Therapeutic exercise;Balance training;Neuromuscular re-education;Patient/family education;Manual techniques;Passive range of motion   PT Next Visit Plan Re-evaluate next session for possible discharge (per last treatment note).  Update gcodes.  Assess effectiveness of ice massage   PT Home Exercise Plan no changes this session   Consulted and Agree with Plan of Care Patient      Patient will benefit from skilled therapeutic intervention in order to improve the following deficits and impairments:  Abnormal gait, Decreased activity tolerance, Decreased balance, Pain, Decreased  strength, Increased fascial restricitons, Increased muscle spasms, Decreased range of motion  Visit Diagnosis: Difficulty in walking, not elsewhere classified  Stiffness of right ankle, not elsewhere classified  Muscle weakness (generalized)  Pain in right ankle and joints of right foot     Problem List Patient Active Problem List   Diagnosis Date Noted  . Achilles tendonitis 10/04/2013  . Metatarsal fracture 03/13/2011   Teena Irani, PTA/CLT [phone]  09/18/2015, 2:43 PM  [place] [address] [place], [place], [address] Phone: [phone]   Fax:  [phone]  Name: KIMMORA RISENHOOVER MRN: [id number] Date of Birth: 12/11/1945

## 2015-09-20 ENCOUNTER — Ambulatory Visit (HOSPITAL_COMMUNITY): Payer: Medicare Other | Admitting: Physical Therapy

## 2015-09-20 DIAGNOSIS — M25671 Stiffness of right ankle, not elsewhere classified: Secondary | ICD-10-CM | POA: Diagnosis not present

## 2015-09-20 DIAGNOSIS — M25571 Pain in right ankle and joints of right foot: Secondary | ICD-10-CM

## 2015-09-20 DIAGNOSIS — R262 Difficulty in walking, not elsewhere classified: Secondary | ICD-10-CM

## 2015-09-20 DIAGNOSIS — M6281 Muscle weakness (generalized): Secondary | ICD-10-CM

## 2015-09-20 NOTE — Patient Instructions (Signed)
Towel scrunch  Place towel on hard surface.  Begin with the foot flat on the floor.  Keeping the heel on the floor, repetitively "scrunch up" the towel by curling your toes.  Each repetition of toe curling is one repetition.  Attempt to bunch up the towel under the foot.  x5 times each foot    PIRIFORMIS AND HIP STRETCH - SEATED  While sitting in a chair, cross your affected leg on top of the other as shown.   Next, gently lean forward until a stretch is felt along the crossed leg. Hold 30 sce, repeat 3x     MODIFIED TOE RAISE  Stand on edge of step, and make sure you have something to hold on to for stability. Place injured sides foot slightly off box as shown in picture and the non-injured flat on step. When raising onto toes, bear MOST of your weight on the non-injured side.  When lowering down onto heels, bear weight on the injured side allowing heel to go just below box for stretch.  x20

## 2015-09-20 NOTE — Therapy (Signed)
[place], [place], [address] Phone: [phone]   Fax:  [phone]  Physical Therapy Treatment  Patient Details  Name: Carol Byrd MRN: [id number] Date of Birth: [birth year] Referring Provider: Krista Blue, MD  Encounter Date: 09/20/2015      PT End of Session - 09/20/15 1345    Visit Number 10   Number of Visits 16   Date for PT Re-Evaluation 09/08/15   Authorization Type [employer] medicare    Authorization - Visit Number 10   Authorization - Number of Visits 10   PT Start Time 1301   PT Stop Time 1340   PT Time Calculation (min) 39 min   Equipment Utilized During Treatment Gait belt   Activity Tolerance Patient tolerated treatment well   Behavior During Therapy [facility] for tasks assessed/performed      Past Medical History  Diagnosis Date  . High blood pressure   . High cholesterol     Past Surgical History  Procedure Laterality Date  . Thyroidectomy    . Cataracts    . Retinal tear repair cryotherapy    . Retinal peel    . Colonoscopy N/A 02/14/2015    Procedure: COLONOSCOPY;  Surgeon: Rogene Houston, MD;  [facility];  Service: Endoscopy;  Laterality: N/A;  1200    There were no vitals filed for this visit.      Subjective Assessment - 09/20/15 1306    Subjective Pt states she has been doing good and she is still doing her exercises. Says she is wearing her boot alot more during the night and it's making significant improvement. Feels she has made 80% improvement overall.   How long can you sit comfortably? unlimited   How long can you stand comfortably? unlimited   How long can you walk comfortably? unlimited   Patient Stated Goals no pain    Currently in Pain? No/denies            [facility] PT Assessment - 09/20/15 0001    Assessment   Medical Diagnosis Rt achilles tendon   Referring Provider Krista Blue, MD   Onset Date/Surgical Date 07/30/15   Next MD Visit None now    Prior Therapy none   Precautions   Precautions None   Restrictions   Weight Bearing Restrictions No   Balance Screen   Has the patient fallen in the past 6 months No   Has the patient had a decrease in activity level because of a fear of falling?  No   Is the patient reluctant to leave their home because of a fear of falling?  No   Home Environment   Living Environment Private residence   Type of [place] to enter   Entrance Stairs-Number of Steps [place] Two level   Prior Function   Level of [place] Retired   Leisure travel, cooking , Community education officer    Observation/Other Assessments   Focus on Therapeutic Outcomes (FOTO)  34% limited   Functional Tests   Functional tests Single leg stance   Single Leg Stance   Comments Lt: 30 sec, Rt: 22 sec   AROM   Right Ankle Dorsiflexion 7   Right Ankle Plantar Flexion 60   Right Ankle Inversion 20   Right Ankle Eversion 15   Strength   Right Ankle Dorsiflexion 5/5   Right Ankle Plantar Flexion 5/5  Right Ankle Inversion 5/5   Right Ankle Eversion 5/5   Palpation   Palpation comment Mild TTP along achilles tendon insertion                     [facility] Adult PT Treatment/Exercise - 09/20/15 0001    Exercises   Exercises Other Exercises   Other Exercises  toe crunches with Rt x5 rounds Piriformis stretch x30 sec each   Ankle Exercises: Standing   SLS each LE x3 trials    Heel Raises 10 reps  focus on RLE eccentric lowering from step, BUE support                PT Education - 09/20/15 1343    Education provided Yes   Education Details reviewed icing, massage and stretching to improve pain; final HEP; discussed goals and D/C   Person(s) Educated Patient   Methods Explanation;Demonstration;Handout   Comprehension Verbalized understanding;Returned demonstration          PT Short Term Goals - 09/20/15 1319    PT SHORT TERM GOAL #1   Title Pt  to be independent with home exercise program to allow increased  range of motion of her right ankle to improve gait mechanics.    Time 4   Period Weeks   Status Achieved   PT SHORT TERM GOAL #2   Title Pt right heel  pain to be no greater than a 5/10 in the mornings when she goes to walk to decrease risk of falling    Baseline 2/10   Time 4   Period Weeks   Status Achieved   PT SHORT TERM GOAL #3   Title Pt right plantarflexion strength to increase to 4/10 to allow pt to state that she is able to descend steps without difficulty.   Time 4   Period Weeks   Status Achieved   PT SHORT TERM GOAL #4   Title Pt balance improved to at least 30 second single leg stance to allow pt to be confident walking over uneven grass areas.    Baseline R: 22 sec; L: 30 sec   Time 4   Period Weeks   Status Partially Met           PT Long Term Goals - 09/20/15 1324    PT LONG TERM GOAL #1   Title Pt Right ankle ROM to be 12-15 degrees to allow pt to demonstrate normal gait pattern with good heel strike and toe push off with equal strides to allow pt to ambulate for over an hour without noticing any limping behaviors.    Baseline ankle DF improved to 7 deg    Time 8   Period Weeks   Status Partially Met   PT LONG TERM GOAL #2   Title Pt  right ankle pain level to be 3 or less when patient first gets out of bed for decreased risk of falling    Baseline 2/10   Time 8   Period Weeks   Status Achieved   PT LONG TERM GOAL #3   Title Pt right gastrocnemius musculatrue to be at least a 4+/5 to allow pt to be able to rise on her toes with no symptoms to place an object weighing 15# or less into a high cabinet.    Time 8   Period Weeks   Status Achieved   PT LONG TERM GOAL #4   Title Pt to be able to verbalize self help treatments if  symptoms return including but not limited to friction massage, stretching and icing    Time 8   Period Weeks   Status Achieved   PT LONG TERM GOAL #5   Title Pt to be  able to complete a single leg stance for 45 seconds on both legs to feel confident walking on grassy inclines and declines    Baseline L: 30 sec, R: 22 sec   Time 8   Period Weeks   Status Not Met               Plan - 2015-10-10 1345    Clinical Impression Statement Pt was reassessed this session having met most of her short and long term goals. She is reporting minimal pain throughout the day, noting occasional 2/10 pain that is "rare". She has been performing self-massage, applying heat and stretching at home and therapist reviewed icing techniques as well. She demonstrates improved Rt ankle strength to 5/5 MMT, improvements in ROM and improved single leg balance on Rt LE to up to 20 sec without LOB or pain. She is consistent and independent at performing her HEP and therapist reviewed finalized home management program with pt returning demonstration and verbalizing understanding. Overall, pt feels she has made significant improvement since beginning PT and feels she is capable of maintaining her progress as well as improving remaining limitations in balance and ROM with continued adherence to HEP. Pt no longer requires skilled PT services to address her remaining limitations and will be discharged from PT at this time with minimal pain report, improved balance, strength and independence with advanced home management program.    Rehab Potential Good   PT Frequency 2x / week   PT Duration 8 weeks   PT Treatment/Interventions ADLs/Self Care Home Management;Cryotherapy;Electrical Stimulation;Iontophoresis 49m/ml Dexamethasone;Moist Heat;Ultrasound;Gait training;Therapeutic activities;Therapeutic exercise;Balance training;Neuromuscular re-education;Patient/family education;Manual techniques;Passive range of motion   PT Next Visit Plan pt d/c this session   PT Home Exercise Plan final HEP, see pt instructions   Consulted and Agree with Plan of Care Patient      Patient will benefit from skilled  therapeutic intervention in order to improve the following deficits and impairments:  Abnormal gait, Decreased activity tolerance, Decreased balance, Pain, Decreased strength, Increased fascial restricitons, Increased muscle spasms, Decreased range of motion  Visit Diagnosis: Difficulty in walking, not elsewhere classified  Stiffness of right ankle, not elsewhere classified  Muscle weakness (generalized)  Pain in right ankle and joints of right foot       G-Codes - 0May 24, [id number]    Functional Assessment Tool Used FOTO improved from 66% limited to 34% limited    Functional Limitation Mobility: Walking and moving around   Mobility: Walking and Moving Around Current Status [phone] At least 20 percent but less than 40 percent impaired, limited or restricted   Mobility: Walking and Moving Around Goal Status [phone] At least 20 percent but less than 40 percent impaired, limited or restricted   Mobility: Walking and Moving Around Discharge Status [phone] At least 20 percent but less than 40 percent impaired, limited or restricted     PHYSICAL THERAPY DISCHARGE SUMMARY  Visits from Start of Care: 10  Current functional level related to goals / functional outcomes: Improved ankle strength, ROM, balance and pain report. Currently unlimited with all activity per pt report. See clinical impression above for more details.    Remaining deficits: Although improved, she continues to present with limited Rt ankle DF. This will improve with continued participation in updated  HEP. Education / Equipment: advanced home management program Plan: Patient agrees to discharge.  Patient goals were partially met. Patient is being discharged due to being pleased with the current functional level.  ?????       *All goals met except balance greater than 45 sec and ankle dorsiflexion ROM. Pt pleased with progress made and willing to continue with HEP at home to address remaining deficits.    Problem  List Patient Active Problem List   Diagnosis Date Noted  . Achilles tendonitis 10/04/2013  . Metatarsal fracture 03/13/2011   4:28 PM,09/20/2015 Elly Modena PT, DPT [facility] [place] [address] [place], [place], [address] Phone: [phone]   Fax:  [phone]  Name: Carol Byrd MRN: [id number] Date of Birth: [birth year]

## 2015-09-25 ENCOUNTER — Encounter (HOSPITAL_COMMUNITY): Payer: Medicare Other | Admitting: Physical Therapy

## 2015-09-27 ENCOUNTER — Encounter (HOSPITAL_COMMUNITY): Payer: Medicare Other | Admitting: Physical Therapy

## 2015-10-01 ENCOUNTER — Ambulatory Visit (HOSPITAL_COMMUNITY)
Admission: RE | Admit: 2015-10-01 | Discharge: 2015-10-01 | Disposition: A | Discharge status: Home / Self Care | Payer: Medicare Other | Source: Ambulatory Visit | Attending: Internal Medicine | Admitting: Internal Medicine

## 2015-10-01 ENCOUNTER — Ambulatory Visit (HOSPITAL_COMMUNITY): Payer: Medicare Other

## 2015-10-01 DIAGNOSIS — Z1231 Encounter for screening mammogram for malignant neoplasm of breast: Secondary | ICD-10-CM | POA: Insufficient documentation

## 2015-10-02 ENCOUNTER — Encounter (HOSPITAL_COMMUNITY): Payer: Medicare Other | Admitting: Physical Therapy

## 2015-10-04 ENCOUNTER — Encounter (HOSPITAL_COMMUNITY): Payer: Medicare Other | Admitting: Physical Therapy

## 2015-10-09 ENCOUNTER — Encounter (HOSPITAL_COMMUNITY): Payer: Medicare Other | Admitting: Physical Therapy

## 2015-10-11 ENCOUNTER — Ambulatory Visit (HOSPITAL_COMMUNITY): Payer: Medicare Other | Admitting: Physical Therapy

## 2015-10-16 ENCOUNTER — Encounter (HOSPITAL_COMMUNITY): Payer: Medicare Other | Admitting: Physical Therapy

## 2015-11-08 DIAGNOSIS — H35372 Puckering of macula, left eye: Secondary | ICD-10-CM | POA: Diagnosis not present

## 2015-11-08 DIAGNOSIS — Z961 Presence of intraocular lens: Secondary | ICD-10-CM | POA: Diagnosis not present

## 2015-11-08 DIAGNOSIS — H02831 Dermatochalasis of right upper eyelid: Secondary | ICD-10-CM | POA: Diagnosis not present

## 2015-11-08 DIAGNOSIS — H33302 Unspecified retinal break, left eye: Secondary | ICD-10-CM | POA: Diagnosis not present

## 2015-11-08 DIAGNOSIS — H02834 Dermatochalasis of left upper eyelid: Secondary | ICD-10-CM | POA: Diagnosis not present

## 2015-12-04 DIAGNOSIS — H02831 Dermatochalasis of right upper eyelid: Secondary | ICD-10-CM | POA: Diagnosis not present

## 2015-12-04 DIAGNOSIS — I10 Essential (primary) hypertension: Secondary | ICD-10-CM | POA: Diagnosis not present

## 2015-12-04 DIAGNOSIS — M1990 Unspecified osteoarthritis, unspecified site: Secondary | ICD-10-CM | POA: Diagnosis not present

## 2015-12-04 DIAGNOSIS — H02423 Myogenic ptosis of bilateral eyelids: Secondary | ICD-10-CM | POA: Diagnosis not present

## 2015-12-04 DIAGNOSIS — Z87891 Personal history of nicotine dependence: Secondary | ICD-10-CM | POA: Diagnosis not present

## 2015-12-04 DIAGNOSIS — H02834 Dermatochalasis of left upper eyelid: Secondary | ICD-10-CM | POA: Diagnosis not present

## 2015-12-04 DIAGNOSIS — H0236 Blepharochalasis left eye, unspecified eyelid: Secondary | ICD-10-CM | POA: Diagnosis not present

## 2015-12-04 DIAGNOSIS — H0233 Blepharochalasis right eye, unspecified eyelid: Secondary | ICD-10-CM | POA: Diagnosis not present

## 2015-12-04 DIAGNOSIS — Z7982 Long term (current) use of aspirin: Secondary | ICD-10-CM | POA: Diagnosis not present

## 2016-01-03 DIAGNOSIS — E039 Hypothyroidism, unspecified: Secondary | ICD-10-CM | POA: Diagnosis not present

## 2016-01-03 DIAGNOSIS — Z79899 Other long term (current) drug therapy: Secondary | ICD-10-CM | POA: Diagnosis not present

## 2016-01-03 DIAGNOSIS — I10 Essential (primary) hypertension: Secondary | ICD-10-CM | POA: Diagnosis not present

## 2016-01-03 DIAGNOSIS — R7301 Impaired fasting glucose: Secondary | ICD-10-CM | POA: Diagnosis not present

## 2016-01-03 DIAGNOSIS — E785 Hyperlipidemia, unspecified: Secondary | ICD-10-CM | POA: Diagnosis not present

## 2016-01-29 DIAGNOSIS — I10 Essential (primary) hypertension: Secondary | ICD-10-CM | POA: Diagnosis not present

## 2016-01-29 DIAGNOSIS — Z0001 Encounter for general adult medical examination with abnormal findings: Secondary | ICD-10-CM | POA: Diagnosis not present

## 2016-01-29 DIAGNOSIS — E785 Hyperlipidemia, unspecified: Secondary | ICD-10-CM | POA: Diagnosis not present

## 2016-01-29 DIAGNOSIS — E039 Hypothyroidism, unspecified: Secondary | ICD-10-CM | POA: Diagnosis not present

## 2016-01-29 DIAGNOSIS — Z79899 Other long term (current) drug therapy: Secondary | ICD-10-CM | POA: Diagnosis not present

## 2016-03-24 DIAGNOSIS — R05 Cough: Secondary | ICD-10-CM | POA: Diagnosis not present

## 2016-04-08 DIAGNOSIS — Z961 Presence of intraocular lens: Secondary | ICD-10-CM | POA: Diagnosis not present

## 2016-04-08 DIAGNOSIS — H35372 Puckering of macula, left eye: Secondary | ICD-10-CM | POA: Diagnosis not present

## 2016-05-22 DIAGNOSIS — R350 Frequency of micturition: Secondary | ICD-10-CM | POA: Diagnosis not present

## 2016-08-05 DIAGNOSIS — I10 Essential (primary) hypertension: Secondary | ICD-10-CM | POA: Diagnosis not present

## 2016-08-25 ENCOUNTER — Other Ambulatory Visit (HOSPITAL_COMMUNITY): Payer: Self-pay | Admitting: Internal Medicine

## 2016-08-25 DIAGNOSIS — Z1231 Encounter for screening mammogram for malignant neoplasm of breast: Secondary | ICD-10-CM

## 2016-10-01 ENCOUNTER — Ambulatory Visit (HOSPITAL_COMMUNITY): Payer: Medicare Other

## 2016-10-09 ENCOUNTER — Ambulatory Visit (HOSPITAL_COMMUNITY)
Admission: RE | Admit: 2016-10-09 | Discharge: 2016-10-09 | Disposition: A | Discharge status: Home / Self Care | Payer: Medicare Other | Source: Ambulatory Visit | Attending: Internal Medicine | Admitting: Internal Medicine

## 2016-10-09 DIAGNOSIS — Z1231 Encounter for screening mammogram for malignant neoplasm of breast: Secondary | ICD-10-CM | POA: Diagnosis not present

## 2016-11-13 DIAGNOSIS — H02831 Dermatochalasis of right upper eyelid: Secondary | ICD-10-CM | POA: Diagnosis not present

## 2016-11-13 DIAGNOSIS — H02834 Dermatochalasis of left upper eyelid: Secondary | ICD-10-CM | POA: Diagnosis not present

## 2016-11-13 DIAGNOSIS — H33312 Horseshoe tear of retina without detachment, left eye: Secondary | ICD-10-CM | POA: Diagnosis not present

## 2016-11-13 DIAGNOSIS — Z961 Presence of intraocular lens: Secondary | ICD-10-CM | POA: Diagnosis not present

## 2016-11-13 DIAGNOSIS — H35372 Puckering of macula, left eye: Secondary | ICD-10-CM | POA: Diagnosis not present

## 2016-12-17 DIAGNOSIS — L658 Other specified nonscarring hair loss: Secondary | ICD-10-CM | POA: Diagnosis not present

## 2016-12-17 DIAGNOSIS — D1801 Hemangioma of skin and subcutaneous tissue: Secondary | ICD-10-CM | POA: Diagnosis not present

## 2016-12-17 DIAGNOSIS — I8390 Asymptomatic varicose veins of unspecified lower extremity: Secondary | ICD-10-CM | POA: Diagnosis not present

## 2016-12-17 DIAGNOSIS — I781 Nevus, non-neoplastic: Secondary | ICD-10-CM | POA: Diagnosis not present

## 2016-12-17 DIAGNOSIS — L821 Other seborrheic keratosis: Secondary | ICD-10-CM | POA: Diagnosis not present

## 2017-01-28 DIAGNOSIS — E039 Hypothyroidism, unspecified: Secondary | ICD-10-CM | POA: Diagnosis not present

## 2017-01-28 DIAGNOSIS — Z79899 Other long term (current) drug therapy: Secondary | ICD-10-CM | POA: Diagnosis not present

## 2017-01-28 DIAGNOSIS — E785 Hyperlipidemia, unspecified: Secondary | ICD-10-CM | POA: Diagnosis not present

## 2017-01-28 DIAGNOSIS — E559 Vitamin D deficiency, unspecified: Secondary | ICD-10-CM | POA: Diagnosis not present

## 2017-01-28 DIAGNOSIS — I10 Essential (primary) hypertension: Secondary | ICD-10-CM | POA: Diagnosis not present

## 2017-02-03 DIAGNOSIS — E039 Hypothyroidism, unspecified: Secondary | ICD-10-CM | POA: Diagnosis not present

## 2017-02-03 DIAGNOSIS — Z0001 Encounter for general adult medical examination with abnormal findings: Secondary | ICD-10-CM | POA: Diagnosis not present

## 2017-02-03 DIAGNOSIS — I10 Essential (primary) hypertension: Secondary | ICD-10-CM | POA: Diagnosis not present

## 2017-03-12 DIAGNOSIS — Z23 Encounter for immunization: Secondary | ICD-10-CM | POA: Diagnosis not present

## 2017-03-23 ENCOUNTER — Encounter: Payer: Self-pay | Admitting: Vascular Surgery

## 2017-03-23 ENCOUNTER — Ambulatory Visit: Payer: Medicare Other | Admitting: Vascular Surgery

## 2017-03-23 VITALS — BP 158/91 | HR 74 | Temp 98.5°F | Resp 16 | Ht 67.5 in | Wt 168.0 lb

## 2017-03-23 DIAGNOSIS — I8393 Asymptomatic varicose veins of bilateral lower extremities: Secondary | ICD-10-CM

## 2017-03-23 DIAGNOSIS — I83893 Varicose veins of bilateral lower extremities with other complications: Secondary | ICD-10-CM | POA: Insufficient documentation

## 2017-03-23 NOTE — Progress Notes (Signed)
Subjective:     Patient ID: Carol Byrd, female   DOB: 02-Dec-1945, 71 y.o.   MRN: [id number]  HPI This 71 year old female is referred by Dr. [name] for evaluation of spider veins both lower extremities. Patient has noticed increasingly prominent spider veins in both legs over the past 10 years. She has had sclerotherapy on 1 occasion which "worked for a while and then they came back". She has no history of DVT thrombophlebitis stasis ulcers or bleeding. She does get some stinging discomfort in both legs and some occasional restlessness but denies any significant distal edema or other symptoms. She does not elastic compression stockings.  Past Medical History:  Diagnosis Date  . High blood pressure   . High cholesterol     Social History   Tobacco Use  . Smoking status: Former Research scientist (life sciences)  . Smokeless tobacco: Never Used  Substance Use Topics  . Alcohol use: Yes    Comment: daily - wine    Family History  Problem Relation Age of Onset  . Heart disease Unknown   . Cancer Unknown   . Breast cancer Sister     No Known Allergies   Current Outpatient Medications:  .  aspirin 81 MG tablet, Take 81 mg by mouth daily., Disp: , Rfl:  .  Biotin 5000 MCG TABS, Take 5,000 mg by mouth daily., Disp: , Rfl:  .  ezetimibe (ZETIA) 10 MG tablet, Take 10 mg by mouth daily., Disp: , Rfl:  .  finasteride (PROSCAR) 5 MG tablet, Take 5 mg by mouth daily., Disp: , Rfl:  .  levothyroxine (SYNTHROID, LEVOTHROID) 112 MCG tablet, Take 88 mcg daily by mouth. , Disp: , Rfl:  .  meloxicam (MOBIC) 7.5 MG tablet, Take 1 tablet (7.5 mg total) by mouth daily., Disp: 30 tablet, Rfl: 0 .  Omega-3 Fatty Acids (FISH OIL TRIPLE STRENGTH) 1400 MG CAPS, Take 1,400 mg by mouth., Disp: , Rfl:  .  ranitidine (ZANTAC) 75 MG tablet, Take 75 mg by mouth 2 (two) times daily., Disp: , Rfl:  .  simvastatin (ZOCOR) 40 MG tablet, Take 40 mg by mouth at bedtime.  , Disp: , Rfl:  .  telmisartan (MICARDIS) 40 MG tablet, Take 40  mg by mouth daily.  , Disp: , Rfl:   Vitals:   03/23/17 1533  BP: (!) 158/91  Pulse: 74  Resp: 16  Temp: 98.5 F (36.9 C)  SpO2: 98%  Weight: 168 lb (76.2 kg)  Height: 5' 7.5" (1.715 m)    Body mass index is 25.92 kg/m.         Review of Systems Denies chest pain, dyspnea on exertion, PND, orthopnea, hemoptysis, claudication. Has history of hypertension and hyperlipidemia.    Objective:   Physical Exam BP (!) 158/91 (BP Location: Left Arm, Patient Position: Sitting, Cuff Size: Normal)   Pulse 74   Temp 98.5 F (36.9 C)   Resp 16   Ht 5' 7.5" (1.715 m)   Wt 168 lb (76.2 kg)   SpO2 98%   BMI 25.92 kg/m     Gen.-alert and oriented x3 in no apparent distress HEENT normal for age Lungs no rhonchi or wheezing Cardiovascular regular rhythm no murmurs carotid pulses 3+ palpable no bruits audible Abdomen soft nontender no palpable masses Musculoskeletal free of  major deformities Skin clear -no rashes Neurologic normal Lower extremities 3+ femoral and dorsalis pedis pulses palpable bilaterally with no edema Diffuse spider veins bilaterally left greater than right in the lower  third primarily of both legs in the malleolar area lateral and medial. No ulceration. No hyperpigmentation or edema noted.  Today I performed a bedside SonoSite ultrasound exam and visualized both great saphenous veins. There are normal in size with no evidence of reflux throughout       Assessment:     #1 spider veins both lower extremities-likely asymptomatic #2 hypertension #3 hyperlipidemia    Plan:     Have discussed with patient that treatment is either skin laser or foam sclerotherapy and that treatment is sometimes unpredictable. Have also told her that this is not a dangerous situation and treatment is not medically necessary If she should decide she 1 sclerotherapy she will be in touch with our nurse-Liz Wert to further discuss this No indication for any  intervention on great  saphenous system

## 2017-05-18 DIAGNOSIS — Z79899 Other long term (current) drug therapy: Secondary | ICD-10-CM | POA: Diagnosis not present

## 2017-05-18 DIAGNOSIS — E039 Hypothyroidism, unspecified: Secondary | ICD-10-CM | POA: Diagnosis not present

## 2017-06-25 DIAGNOSIS — E039 Hypothyroidism, unspecified: Secondary | ICD-10-CM | POA: Diagnosis not present

## 2017-07-01 DIAGNOSIS — E039 Hypothyroidism, unspecified: Secondary | ICD-10-CM | POA: Diagnosis not present

## 2017-07-01 DIAGNOSIS — I10 Essential (primary) hypertension: Secondary | ICD-10-CM | POA: Diagnosis not present

## 2017-08-04 DIAGNOSIS — H524 Presbyopia: Secondary | ICD-10-CM | POA: Diagnosis not present

## 2017-08-04 DIAGNOSIS — H35372 Puckering of macula, left eye: Secondary | ICD-10-CM | POA: Diagnosis not present

## 2017-08-04 DIAGNOSIS — Z961 Presence of intraocular lens: Secondary | ICD-10-CM | POA: Diagnosis not present

## 2017-08-14 ENCOUNTER — Other Ambulatory Visit (HOSPITAL_COMMUNITY): Payer: Self-pay | Admitting: Internal Medicine

## 2017-08-14 DIAGNOSIS — Z1231 Encounter for screening mammogram for malignant neoplasm of breast: Secondary | ICD-10-CM

## 2017-08-26 DIAGNOSIS — E039 Hypothyroidism, unspecified: Secondary | ICD-10-CM | POA: Diagnosis not present

## 2017-08-31 DIAGNOSIS — Z79899 Other long term (current) drug therapy: Secondary | ICD-10-CM | POA: Diagnosis not present

## 2017-08-31 DIAGNOSIS — R110 Nausea: Secondary | ICD-10-CM | POA: Diagnosis not present

## 2017-08-31 DIAGNOSIS — E039 Hypothyroidism, unspecified: Secondary | ICD-10-CM | POA: Diagnosis not present

## 2017-08-31 DIAGNOSIS — R5383 Other fatigue: Secondary | ICD-10-CM | POA: Diagnosis not present

## 2017-10-14 ENCOUNTER — Ambulatory Visit (HOSPITAL_COMMUNITY)
Admission: RE | Admit: 2017-10-14 | Discharge: 2017-10-14 | Disposition: A | Discharge status: Home / Self Care | Payer: Medicare Other | Source: Ambulatory Visit | Attending: Internal Medicine | Admitting: Internal Medicine

## 2017-10-14 ENCOUNTER — Encounter (HOSPITAL_COMMUNITY): Payer: Self-pay

## 2017-10-14 DIAGNOSIS — Z1231 Encounter for screening mammogram for malignant neoplasm of breast: Secondary | ICD-10-CM

## 2017-11-26 DIAGNOSIS — H33312 Horseshoe tear of retina without detachment, left eye: Secondary | ICD-10-CM | POA: Diagnosis not present

## 2017-11-26 DIAGNOSIS — H35372 Puckering of macula, left eye: Secondary | ICD-10-CM | POA: Diagnosis not present

## 2017-11-26 DIAGNOSIS — H02834 Dermatochalasis of left upper eyelid: Secondary | ICD-10-CM | POA: Diagnosis not present

## 2017-11-26 DIAGNOSIS — Z961 Presence of intraocular lens: Secondary | ICD-10-CM | POA: Diagnosis not present

## 2017-11-26 DIAGNOSIS — H02831 Dermatochalasis of right upper eyelid: Secondary | ICD-10-CM | POA: Diagnosis not present

## 2018-02-18 DIAGNOSIS — L658 Other specified nonscarring hair loss: Secondary | ICD-10-CM | POA: Diagnosis not present

## 2018-02-18 DIAGNOSIS — L821 Other seborrheic keratosis: Secondary | ICD-10-CM | POA: Diagnosis not present

## 2018-02-28 ENCOUNTER — Encounter (HOSPITAL_COMMUNITY): Payer: Self-pay | Admitting: Emergency Medicine

## 2018-02-28 ENCOUNTER — Other Ambulatory Visit: Payer: Self-pay

## 2018-02-28 ENCOUNTER — Emergency Department (HOSPITAL_COMMUNITY)
Admission: EM | Admit: 2018-02-28 | Discharge: 2018-02-28 | Disposition: A | Discharge status: Home / Self Care | Payer: Medicare Other | Attending: Emergency Medicine | Admitting: Emergency Medicine

## 2018-02-28 DIAGNOSIS — T7840XA Allergy, unspecified, initial encounter: Secondary | ICD-10-CM | POA: Diagnosis not present

## 2018-02-28 DIAGNOSIS — Z79899 Other long term (current) drug therapy: Secondary | ICD-10-CM | POA: Insufficient documentation

## 2018-02-28 DIAGNOSIS — R600 Localized edema: Secondary | ICD-10-CM | POA: Diagnosis present

## 2018-02-28 DIAGNOSIS — Z87891 Personal history of nicotine dependence: Secondary | ICD-10-CM | POA: Insufficient documentation

## 2018-02-28 DIAGNOSIS — E7800 Pure hypercholesterolemia, unspecified: Secondary | ICD-10-CM | POA: Diagnosis not present

## 2018-02-28 DIAGNOSIS — I10 Essential (primary) hypertension: Secondary | ICD-10-CM | POA: Diagnosis not present

## 2018-02-28 DIAGNOSIS — Z7982 Long term (current) use of aspirin: Secondary | ICD-10-CM | POA: Insufficient documentation

## 2018-02-28 MED ORDER — PREDNISONE 50 MG PO TABS: ORAL_TABLET | ORAL | 0 refills | Status: DC

## 2018-02-28 MED ORDER — FAMOTIDINE 20 MG PO TABS: 20.0000 mg | ORAL_TABLET | Freq: Two times a day (BID) | ORAL | 0 refills | Status: DC

## 2018-02-28 MED ORDER — PREDNISONE 50 MG PO TABS
60.0000 mg | ORAL_TABLET | Freq: Once | ORAL | Status: AC
  Administered 2018-02-28: 60 mg via ORAL
  Filled 2018-02-28: qty 1

## 2018-02-28 MED ORDER — FAMOTIDINE 20 MG PO TABS
20.0000 mg | ORAL_TABLET | Freq: Once | ORAL | Status: AC
  Administered 2018-02-28: 20 mg via ORAL
  Filled 2018-02-28: qty 1

## 2018-02-28 NOTE — ED Provider Notes (Signed)
[facility] EMERGENCY DEPARTMENT Provider Note   CSN: [id number] Arrival date & time: 02/28/18  0407     History   Chief Complaint Chief Complaint  Patient presents with  . Allergic Reaction    HPI Carol Byrd is a 72 y.o. female.  Patient presents with suspected allergic reaction.  States she woke up around 3 AM with swelling to her eyes, itching to her hands, groin and swelling of her lips.  She took 50 mg of Benadryl at home and is starting to feel better.  She denies any difficulty breathing or difficulty swallowing.  Denies any throat swelling or itching.  Before she went to bed last night, she noticed an itchy red blotches in her left groin for which she put on anti-itch cream.  Denies any bug bites, tick bites or outdoor exposures. No known allergies.  Denies any new medications, foods, detergents, clothing or cleaning products. She is never had a reaction like this in the past.  She feels that her lip swelling is better but she still has itching around her eyes and itching of her hands.  No shortness of breath or chest pain.\ She does take an ARB.  The history is provided by the patient.  Allergic Reaction  Presenting symptoms: rash     Past Medical History:  Diagnosis Date  . High blood pressure   . High cholesterol     Patient Active Problem List   Diagnosis Date Noted  . Varicose veins of bilateral lower extremities with other complications 24/40/1027  . Spider veins of both lower extremities 03/23/2017  . Achilles tendonitis 10/04/2013  . Metatarsal fracture 03/13/2011    Past Surgical History:  Procedure Laterality Date  . cataracts    . COLONOSCOPY N/A 02/14/2015   Procedure: COLONOSCOPY;  Surgeon: Rogene Houston, MD;  Location: [facility];  Service: Endoscopy;  Laterality: N/A;  1200  . retinal peel    . RETINAL TEAR REPAIR CRYOTHERAPY    . THYROIDECTOMY       OB History   None      Home Medications    Prior to Admission  medications   Medication Sig Start Date End Date Taking? Authorizing Provider  aspirin 81 MG tablet Take 81 mg by mouth daily.    [provider]  Biotin 5000 MCG TABS Take 5,000 mg by mouth daily.    [provider]  ezetimibe (ZETIA) 10 MG tablet Take 10 mg by mouth daily.    [provider]  finasteride (PROSCAR) 5 MG tablet Take 2.5 mg by mouth daily.     [provider]  levothyroxine (SYNTHROID, LEVOTHROID) 112 MCG tablet Take 88 mcg daily by mouth.     [provider]  meloxicam (MOBIC) 7.5 MG tablet Take 1 tablet (7.5 mg total) by mouth daily. 07/04/14   Trula Slade, DPM  Omega-3 Fatty Acids (FISH OIL TRIPLE STRENGTH) 1400 MG CAPS Take 1,400 mg by mouth.    [provider]  ranitidine (ZANTAC) 75 MG tablet Take 75 mg by mouth 2 (two) times daily.    [provider]  simvastatin (ZOCOR) 40 MG tablet Take 40 mg by mouth at bedtime.      [provider]  telmisartan (MICARDIS) 40 MG tablet Take 40 mg by mouth daily.      [provider]    Family History Family History  Problem Relation Age of Onset  . Heart disease Unknown   . Cancer Unknown   .  Breast cancer Sister     Social History Social History   Tobacco Use  . Smoking status: Former Research scientist (life sciences)  . Smokeless tobacco: Never Used  Substance Use Topics  . Alcohol use: Yes    Comment: daily - wine  . Drug use: No     Allergies   Patient has no known allergies.   Review of Systems Review of Systems  Constitutional: Negative for activity change, appetite change and fever.  HENT: Negative for congestion and rhinorrhea.   Eyes: Negative for visual disturbance.  Respiratory: Negative for cough, chest tightness and shortness of breath.   Cardiovascular: Negative for chest pain.  Gastrointestinal: Negative for abdominal pain, nausea and vomiting.  Genitourinary: Negative for dysuria and hematuria.  Skin: Positive for rash.  Neurological:  Negative for dizziness, weakness, light-headedness and headaches.   all other systems are negative except as noted in the HPI and PMH.    Physical Exam Updated Vital Signs BP (!) 143/79 (BP Location: Right Arm)   Pulse 78   Temp 98.8 F (37.1 C) (Oral)   Resp 18   Wt 76.2 kg   SpO2 98%   BMI 25.92 kg/m   Physical Exam  Constitutional: She is oriented to person, place, and time. She appears well-developed and well-nourished. No distress.  HENT:  Head: Normocephalic and atraumatic.  Mouth/Throat: Oropharynx is clear and moist. No oropharyngeal exudate.  No appreciable tongue or lip swelling.  Uvula is normal.  Normal voice.  No drooling or trismus  Eyes: Pupils are equal, round, and reactive to light. Conjunctivae and EOM are normal.  Neck: Normal range of motion. Neck supple.  No meningismus.  Cardiovascular: Normal rate, regular rhythm, normal heart sounds and intact distal pulses.  No murmur heard. Pulmonary/Chest: Effort normal and breath sounds normal. No respiratory distress. She exhibits no tenderness.  Abdominal: Soft. There is no tenderness. There is no rebound and no guarding.  Musculoskeletal: Normal range of motion. She exhibits no edema or tenderness.  Neurological: She is alert and oriented to person, place, and time. No cranial nerve deficit. She exhibits normal muscle tone. Coordination normal.  No ataxia on finger to nose bilaterally. No pronator drift. 5/5 strength throughout. CN 2-12 intact.Equal grip strength. Sensation intact.   Skin: Skin is warm. Capillary refill takes less than 2 seconds. Rash noted.  Maculopapular rash present to left anterior thigh near inguinal crease  No appreciable rash elsewhere.  No rash between fingers or toes.  Psychiatric: She has a normal mood and affect. Her behavior is normal.  Nursing note and vitals reviewed.    ED Treatments / Results  Labs (all labs ordered are listed, but only abnormal results are displayed) Labs  Reviewed - No data to display  EKG EKG Interpretation  Date/Time:  Sunday February 28 2018 04:20:47 EDT Ventricular Rate:  76 PR Interval:    QRS Duration: 90 QT Interval:  415 QTC Calculation: 467 R Axis:   79 Text Interpretation:  Sinus rhythm Low voltage, precordial leads RSR' in V1 or V2, right VCD or RVH No previous ECGs available Confirmed by Ezequiel Essex [phone]) on 02/28/2018 4:33:09 AM   Radiology No results found.  Procedures Procedures (including critical care time)  Medications Ordered in ED Medications  predniSONE (DELTASONE) tablet 60 mg (has no administration in time range)  famotidine (PEPCID) tablet 20 mg (has no administration in time range)     Initial Impression / Assessment and Plan / ED Course  I have reviewed the triage  vital signs and the nursing notes.  Pertinent labs & imaging results that were available during my care of the patient were reviewed by me and considered in my medical decision making (see chart for details).    Suspected allergic reaction to unknown allergen.  Improving after she took Benadryl at home.  No tongue or lip swelling appreciated.  No chest pain or shortness of breath.  We will give p.o. steroids and Pepcid.  Will monitor.  Discussed with patient that her telmisartan medication is sometimes associated with lip swelling and would recommend stopping this for the time being.  Patient observed in the ED for 2 hours without deterioration in condition.  No tongue or lip swelling.  No chest pain or shortness of breath.  She is tolerating p.o. and ambulatory  Advised to stop her telmisartan medication.  We will treat with steroids and antihistamines.  Discuss with PCP starting a different blood pressure medication.  Advised the patient and her husband that she should not take any medications from the ACE inhibitor or ARB class. Added to allergy list.  Return to the ED with difficulty breathing, difficulty swallowing, chest pain,  shortness of breath, tongue or lip swelling or any other concerns.  Final Clinical Impressions(s) / ED Diagnoses   Final diagnoses:  Allergic reaction, initial encounter    ED Discharge Orders    None       Manjot Beumer, Annie Main, MD 02/28/18 [phone]

## 2018-02-28 NOTE — Discharge Instructions (Addendum)
As we discussed, it is not certain what caused your allergic reaction.  It seems that this could have been a bug bite or sting.  Take the prednisone as prescribed and Benadryl as needed for itching.  Do not take the Benadryl if you are working or driving as it could make you sleepy. As a precaution, we recommend that you stop your telmisartan medication and tell Dr. Willey Blade this.  You should not take any medications that are in the ACE inhibitor (ends in -pril) or ARB classes (ends in -artan).  Return to the ED if you develop new or worsening symptoms including difficulty breathing, difficulty swallowing, tongue or lip swelling, chest pain, shortness of breath or other concerns.

## 2018-02-28 NOTE — ED Triage Notes (Addendum)
Pt states around 2200 last night she had itching her her groin area. Pt states that when she woke up around 0300 her eyes and lips were swollen and hands were itching. Pt denies any itchy or swollen throat. Pt denies any new soaps, detergents, or foods. Pt has taken 50mg  benadryl.

## 2018-03-02 DIAGNOSIS — Z7689 Persons encountering health services in other specified circumstances: Secondary | ICD-10-CM | POA: Diagnosis not present

## 2018-03-02 DIAGNOSIS — W57XXXA Bitten or stung by nonvenomous insect and other nonvenomous arthropods, initial encounter: Secondary | ICD-10-CM | POA: Diagnosis not present

## 2018-03-23 DIAGNOSIS — Z23 Encounter for immunization: Secondary | ICD-10-CM | POA: Diagnosis not present

## 2018-03-24 DIAGNOSIS — I10 Essential (primary) hypertension: Secondary | ICD-10-CM | POA: Diagnosis not present

## 2018-03-24 DIAGNOSIS — E039 Hypothyroidism, unspecified: Secondary | ICD-10-CM | POA: Diagnosis not present

## 2018-03-24 DIAGNOSIS — Z79899 Other long term (current) drug therapy: Secondary | ICD-10-CM | POA: Diagnosis not present

## 2018-03-30 DIAGNOSIS — E039 Hypothyroidism, unspecified: Secondary | ICD-10-CM | POA: Diagnosis not present

## 2018-03-30 DIAGNOSIS — E785 Hyperlipidemia, unspecified: Secondary | ICD-10-CM | POA: Diagnosis not present

## 2018-03-30 DIAGNOSIS — Z0001 Encounter for general adult medical examination with abnormal findings: Secondary | ICD-10-CM | POA: Diagnosis not present

## 2018-03-30 DIAGNOSIS — I10 Essential (primary) hypertension: Secondary | ICD-10-CM | POA: Diagnosis not present

## 2018-04-13 ENCOUNTER — Other Ambulatory Visit (INDEPENDENT_AMBULATORY_CARE_PROVIDER_SITE_OTHER): Payer: Self-pay | Admitting: *Deleted

## 2018-04-13 ENCOUNTER — Ambulatory Visit (INDEPENDENT_AMBULATORY_CARE_PROVIDER_SITE_OTHER): Payer: Medicare Other | Admitting: Internal Medicine

## 2018-04-13 ENCOUNTER — Encounter (INDEPENDENT_AMBULATORY_CARE_PROVIDER_SITE_OTHER): Payer: Self-pay | Admitting: Internal Medicine

## 2018-04-13 VITALS — BP 130/80 | HR 66 | Temp 99.7°F | Resp 18 | Ht 66.5 in | Wt 167.7 lb

## 2018-04-13 DIAGNOSIS — K219 Gastro-esophageal reflux disease without esophagitis: Secondary | ICD-10-CM | POA: Diagnosis not present

## 2018-04-13 DIAGNOSIS — R1310 Dysphagia, unspecified: Secondary | ICD-10-CM

## 2018-04-13 DIAGNOSIS — R1319 Other dysphagia: Secondary | ICD-10-CM

## 2018-04-13 MED ORDER — FAMOTIDINE 20 MG PO TABS: 20.0000 mg | ORAL_TABLET | Freq: Two times a day (BID) | ORAL | 0 refills | Status: DC

## 2018-04-13 NOTE — Patient Instructions (Signed)
Esophagogastroduodenoscopy with esophageal dilation to be scheduled. 

## 2018-04-13 NOTE — Progress Notes (Addendum)
Presenting complaint;  Swallowing difficulty.  Subjective:  Patient is 72 year old Caucasian female who was last seen in July 2016 for screening colonoscopy who now presents with 3-year history of solid food dysphagia.  Initially symptoms infrequent.  Lately has been occurring once or twice a week and she also has episodes of food impaction relieved with regurgitation.  She states she has heartburn 2-3 times a week.  She denies nausea vomiting hematemesis melena or rectal bleeding.  She points to midsternal area sort of bolus obstruction.  She says when this happens she develops burning epigastric pain and went she gets relief this pain goes away. She is presently not taking any medication for GERD. She has lost few pounds recently.  She was recently diagnosed with alpha gal sensitivity and does not eat beef or pork.   Current Medications: Outpatient Encounter Medications as of 04/13/2018  Medication Sig  . atorvastatin (LIPITOR) 20 MG tablet Take 20 mg by mouth. Patient states that she takes weekly.  . Biotin 5000 MCG TABS Take 5,000 mg by mouth daily.  Marland Kitchen ezetimibe (ZETIA) 10 MG tablet Take 10 mg by mouth daily.  . finasteride (PROSCAR) 5 MG tablet Take 2.5 mg by mouth daily.   Marland Kitchen levothyroxine (SYNTHROID, LEVOTHROID) 112 MCG tablet Take 88 mcg daily by mouth.   . meloxicam (MOBIC) 7.5 MG tablet Take 1 tablet (7.5 mg total) by mouth daily.  . Multiple Vitamins-Minerals (VITAMIN D3 COMPLETE PO) Take 2,000 Units by mouth daily.  . famotidine (PEPCID) 20 MG tablet Take 1 tablet (20 mg total) by mouth 2 (two) times daily. (Patient not taking: Reported on 04/13/2018)  . [DISCONTINUED] aspirin 81 MG tablet Take 81 mg by mouth daily.  . [DISCONTINUED] Omega-3 Fatty Acids (FISH OIL TRIPLE STRENGTH) 1400 MG CAPS Take 1,400 mg by mouth.  . [DISCONTINUED] predniSONE (DELTASONE) 50 MG tablet 1 tablet PO daily (Patient not taking: Reported on 04/13/2018)  . [DISCONTINUED] simvastatin (ZOCOR) 40 MG tablet  Take 40 mg by mouth at bedtime.     No facility-administered encounter medications on file as of 04/13/2018.    Past Medical History:  Diagnosis Date  . High blood pressure   . High cholesterol    Past Surgical History:  Procedure Laterality Date  . cataracts    . COLONOSCOPY N/A 02/14/2015   Procedure: COLONOSCOPY;  Surgeon: Rogene Houston, MD;  Location: [facility];  Service: Endoscopy;  Laterality: N/A;  1200  . retinal peel    . RETINAL TEAR REPAIR CRYOTHERAPY    . THYROIDECTOMY years ago for thyrotoxicosis.         Lumbar spine surgery in 1985.      Hair loss.  Family history: Father had AAA and died of MI at age 93. Mother had Parkinson's disease.  She was diagnosed at age 76 and died at age 1. She has 1 sister who recently underwent second mastectomy for breast carcinoma.  Social history: She is retired.  She is married.  She has never smoked cigarettes.  She drinks 2 drinks of wine daily.  Objective: Blood pressure 130/80, pulse 66, temperature 99.7 F (37.6 C), temperature source Oral, resp. rate 18, height 5' 6.5" (1.689 m), weight 167 lb 11.2 oz (76.1 kg). Patient is alert and in no acute distress. Conjunctiva is pink. Sclera is nonicteric Oropharyngeal mucosa is normal. She has thyroid collar scar.  No adenopathy or masses. Cardiac exam with regular rhythm normal S1 and S2. No murmur or gallop noted. Lungs are clear  to auscultation. Abdomen is symmetrical soft and nontender with organomegaly or masses. No LE edema or clubbing noted.   Assessment:  #1.  Esophageal dysphagia of 3 years duration of more frequently lately.  She has had few episodes of food impaction.  Possibly has Schatzki's ring but she could also have esophageal stricture or motility disorder.  #2.  GERD.  She is having frequent heartburn.   Plan:  Famotidine 20 mg p.o. twice daily. Esophagogastroduodenoscopy with esophageal dilation to be scheduled near future.

## 2018-04-14 ENCOUNTER — Encounter (HOSPITAL_COMMUNITY)
Admission: RE | Disposition: A | Discharge status: Home / Self Care | Payer: Self-pay | Source: Ambulatory Visit | Attending: Internal Medicine

## 2018-04-14 ENCOUNTER — Ambulatory Visit (HOSPITAL_COMMUNITY)
Admission: RE | Admit: 2018-04-14 | Discharge: 2018-04-14 | Disposition: A | Discharge status: Home / Self Care | Payer: Medicare Other | Source: Ambulatory Visit | Attending: Internal Medicine | Admitting: Internal Medicine

## 2018-04-14 ENCOUNTER — Other Ambulatory Visit: Payer: Self-pay

## 2018-04-14 ENCOUNTER — Encounter (HOSPITAL_COMMUNITY): Payer: Self-pay | Admitting: *Deleted

## 2018-04-14 DIAGNOSIS — Z79899 Other long term (current) drug therapy: Secondary | ICD-10-CM | POA: Diagnosis not present

## 2018-04-14 DIAGNOSIS — R1314 Dysphagia, pharyngoesophageal phase: Secondary | ICD-10-CM | POA: Diagnosis not present

## 2018-04-14 DIAGNOSIS — K228 Other specified diseases of esophagus: Secondary | ICD-10-CM | POA: Insufficient documentation

## 2018-04-14 DIAGNOSIS — Z87891 Personal history of nicotine dependence: Secondary | ICD-10-CM | POA: Insufficient documentation

## 2018-04-14 DIAGNOSIS — Z79890 Hormone replacement therapy (postmenopausal): Secondary | ICD-10-CM | POA: Insufficient documentation

## 2018-04-14 DIAGNOSIS — D130 Benign neoplasm of esophagus: Secondary | ICD-10-CM | POA: Insufficient documentation

## 2018-04-14 DIAGNOSIS — E7800 Pure hypercholesterolemia, unspecified: Secondary | ICD-10-CM | POA: Insufficient documentation

## 2018-04-14 DIAGNOSIS — K449 Diaphragmatic hernia without obstruction or gangrene: Secondary | ICD-10-CM | POA: Diagnosis not present

## 2018-04-14 DIAGNOSIS — R1310 Dysphagia, unspecified: Secondary | ICD-10-CM | POA: Insufficient documentation

## 2018-04-14 DIAGNOSIS — E039 Hypothyroidism, unspecified: Secondary | ICD-10-CM | POA: Insufficient documentation

## 2018-04-14 HISTORY — PX: BIOPSY: SHX5522

## 2018-04-14 HISTORY — PX: ESOPHAGOGASTRODUODENOSCOPY: SHX5428

## 2018-04-14 HISTORY — PX: ESOPHAGEAL DILATION: SHX303

## 2018-04-14 HISTORY — DX: Hypothyroidism, unspecified: E03.9

## 2018-04-14 SURGERY — EGD (ESOPHAGOGASTRODUODENOSCOPY)
Anesthesia: Moderate Sedation

## 2018-04-14 MED ORDER — SODIUM CHLORIDE 0.9 % IV SOLN
INTRAVENOUS | Status: DC
  Administered 2018-04-14: 11:00:00 via INTRAVENOUS

## 2018-04-14 MED ORDER — MIDAZOLAM HCL 5 MG/5ML IJ SOLN
INTRAMUSCULAR | Status: DC | PRN
  Administered 2018-04-14 (×2): 2 mg via INTRAVENOUS

## 2018-04-14 MED ORDER — STERILE WATER FOR IRRIGATION IR SOLN
Status: DC | PRN
  Administered 2018-04-14: 11:00:00

## 2018-04-14 MED ORDER — LIDOCAINE VISCOUS HCL 2 % MT SOLN
OROMUCOSAL | Status: DC | PRN
  Administered 2018-04-14: 4 mL via OROMUCOSAL

## 2018-04-14 MED ORDER — MIDAZOLAM HCL 5 MG/5ML IJ SOLN
INTRAMUSCULAR | Status: AC
  Filled 2018-04-14: qty 10

## 2018-04-14 MED ORDER — MEPERIDINE HCL 50 MG/ML IJ SOLN
INTRAMUSCULAR | Status: AC
  Filled 2018-04-14: qty 1

## 2018-04-14 MED ORDER — LIDOCAINE VISCOUS HCL 2 % MT SOLN
OROMUCOSAL | Status: AC
  Filled 2018-04-14: qty 15

## 2018-04-14 MED ORDER — MEPERIDINE HCL 50 MG/ML IJ SOLN
INTRAMUSCULAR | Status: DC | PRN
  Administered 2018-04-14 (×2): 25 mg via INTRAVENOUS

## 2018-04-14 NOTE — Op Note (Signed)
[facility] Patient Name: Carol Byrd Procedure Date: 04/14/2018 11:14 AM MRN: [id number] Date of Birth: 09-06-1945 Attending MD: Hildred Laser , MD CSN: [id number] Age: 72 Admit Type: Outpatient Procedure:                Upper GI endoscopy Indications:              Esophageal dysphagia Providers:                Hildred Laser, MD, Jeanann Lewandowsky. Sharon Seller, RN, Nelma Rothman, Technician Referring MD:             [name], MD (PCP). Medicines:                Lidocaine jelly, Meperidine 50 mg IV, Midazolam 4                            mg IV Complications:            No immediate complications. Estimated Blood Loss:     Estimated blood loss was minimal. Procedure:                Pre-Anesthesia Assessment:                           - Prior to the procedure, a History and Physical                            was performed, and patient medications and                            allergies were reviewed. The patient's tolerance of                            previous anesthesia was also reviewed. The risks                            and benefits of the procedure and the sedation                            options and risks were discussed with the patient.                            All questions were answered, and informed consent                            was obtained. Prior Anticoagulants: The patient has                            taken no previous anticoagulant or antiplatelet                            agents. ASA Grade Assessment: II - A patient with  mild systemic disease. After reviewing the risks                            and benefits, the patient was deemed in                            satisfactory condition to undergo the procedure.                           After obtaining informed consent, the endoscope was                            passed under direct vision. Throughout the                            procedure, the  patient's blood pressure, pulse, and                            oxygen saturations were monitored continuously. The                            [id number]-[id number] ([id number]) was introduced through the                            mouth, and advanced to the second part of duodenum.                            The upper GI endoscopy was accomplished without                            difficulty. The patient tolerated the procedure                            well. Scope In: 11:26:42 AM Scope Out: 11:35:54 AM Total Procedure Duration: 0 hours 9 minutes 12 seconds  Findings:      One 3 mm polyp was found 22 cm from the incisors. Biopsies were taken       with a cold forceps for histology(taken post dilation).      The Z-line was regular and was found 37 cm from the incisors.      A 2 cm hiatal hernia was present.      No endoscopic abnormality was evident in the esophagus to explain the       patient's complaint of dysphagia. It was decided, however, to proceed       with dilation of the entire esophagus. The scope was withdrawn. Dilation       was performed with a Maloney dilator with no resistance at 84 Fr. The       dilation site was examined following endoscope reinsertion and showed no       change and no bleeding, mucosal tear or perforation.      The entire examined stomach was normal.      The duodenal bulb and second portion of the duodenum were normal. Impression:               - Esophageal polyp(s) were found. Biopsied.                           -  Z-line regular, 37 cm from the incisors.                           - 2 cm hiatal hernia.                           - No endoscopic esophageal abnormality to explain                            patient's dysphagia. Esophagus dilated. Dilated.                           - Normal stomach.                           - Normal duodenal bulb and second portion of the                            duodenum. Moderate Sedation:      Moderate (conscious) sedation was  administered by the endoscopy nurse       and supervised by the endoscopist. The following parameters were       monitored: oxygen saturation, heart rate, blood pressure, CO2       capnography and response to care. Total physician intraservice time was       13 minutes. Recommendation:           - Patient has a contact number available for                            emergencies. The signs and symptoms of potential                            delayed complications were discussed with the                            patient. Return to normal activities tomorrow.                            Written discharge instructions were provided to the                            patient.                           - Resume previous diet today.                           - Continue present medications.                           - No aspirin, ibuprofen, naproxen, or other                            non-steroidal anti-inflammatory drugs for 1 day.                           -  Await pathology results. Procedure Code(s):        --- Professional ---                           [id number], Esophagogastroduodenoscopy, flexible,                            transoral; with biopsy, single or multiple                           43450, Dilation of esophagus, by unguided sound or                            bougie, single or multiple passes                           G0500, Moderate sedation services provided by the                            same physician or other qualified health care                            professional performing a gastrointestinal                            endoscopic service that sedation supports,                            requiring the presence of an independent trained                            observer to assist in the monitoring of the                            patient's level of consciousness and physiological                            status; initial 15 minutes of intra-service time;                             patient age 69 years or older (additional time may                            be reported with [phone], as appropriate) Diagnosis Code(s):        --- Professional ---                           K22.8, Other specified diseases of esophagus                           K44.9, Diaphragmatic hernia without obstruction or                            gangrene  R13.14, Dysphagia, pharyngoesophageal phase CPT copyright 2018 [employer]. All rights reserved. The codes documented in this report are preliminary and upon coder review may  be revised to meet current compliance requirements. Hildred Laser, MD Hildred Laser, MD 04/14/2018 11:53:21 AM This report has been signed electronically. Number of Addenda: 0

## 2018-04-14 NOTE — H&P (Addendum)
Carol Byrd is an 72 y.o. female.   Chief Complaint: Patient is here for EGD and ED. HPI: Patient is 72 year old Caucasian female who presents with 3-year history of intermittent solid food dysphagia which has been occurring more frequent lately.  She has had at least 20 episodes of food impaction relieved with regurgitation.  She has not lost any weight.  She has not had nausea vomiting.  She has had intermittent heartburn.  She was begun on famotidine yesterday. She does not take aspirin or other anticoagulants.  Past Medical History:  Diagnosis Date  . High blood pressure   . High cholesterol   . Hypothyroidism     Past Surgical History:  Procedure Laterality Date  . BACK SURGERY  1985   bulging lumbar disk  . cataracts    . COLONOSCOPY N/A 02/14/2015   Procedure: COLONOSCOPY;  Surgeon: Rogene Houston, MD;  [facility];  Service: Endoscopy;  Laterality: N/A;  1200  . retinal peel    . RETINAL TEAR REPAIR CRYOTHERAPY    . THYROIDECTOMY      Family History  Problem Relation Age of Onset  . Heart disease Unknown   . Cancer Unknown   . Breast cancer Sister   . Colon cancer Neg Hx    Social History:  reports that she quit smoking about 24 years ago. She has never used smokeless tobacco. She reports that she drinks about 2.0 standard drinks of alcohol per week. She reports that she does not use drugs.  Allergies: No Known Allergies  Medications Prior to Admission  Medication Sig Dispense Refill  . atorvastatin (LIPITOR) 20 MG tablet Take 20 mg by mouth. Patient states that she takes weekly.    . Biotin 5000 MCG TABS Take 5,000 mg by mouth daily.    Marland Kitchen ezetimibe (ZETIA) 10 MG tablet Take 10 mg by mouth daily.    . famotidine (PEPCID) 20 MG tablet Take 1 tablet (20 mg total) by mouth 2 (two) times daily. 30 tablet 0  . finasteride (PROSCAR) 5 MG tablet Take 2.5 mg by mouth daily.     Marland Kitchen levothyroxine (SYNTHROID, LEVOTHROID) 112 MCG tablet Take 88 mcg daily by  mouth.     . Multiple Vitamins-Minerals (VITAMIN D3 COMPLETE PO) Take 2,000 Units by mouth daily.      No results found for this or any previous visit (from the past 48 hour(s)). No results found.  ROS  Blood pressure 127/80, pulse 67, temperature 98.2 F (36.8 C), temperature source Oral, resp. rate 15, height 5' 6.5" (1.689 m), weight 76.1 kg, SpO2 97 %. Physical Exam  Constitutional: She appears well-developed and well-nourished.  HENT:  Mouth/Throat: Oropharynx is clear and moist.  Eyes: Conjunctivae are normal. No scleral icterus.  Neck: No thyromegaly present.  Thyroid collar scar.  Cardiovascular: Normal rate, regular rhythm and normal heart sounds.  No murmur heard. Respiratory: Effort normal and breath sounds normal.  GI: Soft. She exhibits no distension and no mass. There is no tenderness.  Musculoskeletal: She exhibits no edema.  Lymphadenopathy:    She has no cervical adenopathy.  Neurological: She is alert.  Skin: Skin is warm and dry.     Assessment/Plan Esophageal dysphagia. EGD with ED.  Hildred Laser, MD 04/14/2018, 11:17 AM

## 2018-04-14 NOTE — Discharge Instructions (Signed)
Esophagogastroduodenoscopy, Care After Refer to this sheet in the next few weeks. These instructions provide you with information about caring for yourself after your procedure. Your health care provider may also give you more specific instructions. Your treatment has been planned according to current medical practices, but problems sometimes occur. Call your health care provider if you have any problems or questions after your procedure. What can I expect after the procedure? After the procedure, it is common to have:  A sore throat.  Nausea.  Bloating.  Dizziness.  Fatigue.  Follow these instructions at home:  Do not eat or drink anything until the numbing medicine (local anesthetic) has worn off and your gag reflex has returned. You will know that the local anesthetic has worn off when you can swallow comfortably.  Do not drive for 24 hours if you received a medicine to help you relax (sedative).  If your health care provider took a tissue sample for testing during the procedure, make sure to get your test results. This is your responsibility. Ask your health care provider or the department performing the test when your results will be ready.  Keep all follow-up visits as told by your health care provider. This is important. Contact a health care provider if:  You cannot stop coughing.  You are not urinating.  You are urinating less than usual. Get help right away if:  You have trouble swallowing.  You cannot eat or drink.  You have throat or chest pain that gets worse.  You are dizzy or light-headed.  You faint.  You have nausea or vomiting.  You have chills.  You have a fever.  You have severe abdominal pain.  You have black, tarry, or bloody stools. This information is not intended to replace advice given to you by your health care provider. Make sure you discuss any questions you have with your health care provider. Document Released: 04/21/2012 Document  Revised: 10/11/2015 Document Reviewed: 03/29/2015 Elsevier Interactive Patient Education  2018 [employer].   No aspirin or NSAIDs for 24 hours. Resume usual medications including famotidine or Pepcid. Resume usual diet. No driving for 24 hours. Physician will call with biopsy results.

## 2018-04-22 ENCOUNTER — Encounter (HOSPITAL_COMMUNITY): Payer: Self-pay | Admitting: Internal Medicine

## 2018-07-08 DIAGNOSIS — E785 Hyperlipidemia, unspecified: Secondary | ICD-10-CM | POA: Diagnosis not present

## 2018-07-08 DIAGNOSIS — Z79899 Other long term (current) drug therapy: Secondary | ICD-10-CM | POA: Diagnosis not present

## 2018-07-15 DIAGNOSIS — E785 Hyperlipidemia, unspecified: Secondary | ICD-10-CM | POA: Diagnosis not present

## 2018-07-15 DIAGNOSIS — I10 Essential (primary) hypertension: Secondary | ICD-10-CM | POA: Diagnosis not present

## 2018-08-10 ENCOUNTER — Other Ambulatory Visit: Payer: Self-pay

## 2018-08-10 ENCOUNTER — Encounter (INDEPENDENT_AMBULATORY_CARE_PROVIDER_SITE_OTHER): Payer: Medicare Other

## 2018-08-10 DIAGNOSIS — R002 Palpitations: Secondary | ICD-10-CM

## 2018-08-10 NOTE — Progress Notes (Unsigned)
.  ho

## 2018-09-07 ENCOUNTER — Telehealth: Payer: Self-pay | Admitting: *Deleted

## 2018-09-07 ENCOUNTER — Encounter: Payer: Self-pay | Admitting: Cardiology

## 2018-09-07 ENCOUNTER — Telehealth: Payer: Self-pay | Admitting: Cardiology

## 2018-09-07 NOTE — Progress Notes (Signed)
Virtual Visit via Video Note   This visit type was conducted due to national recommendations for restrictions regarding the COVID-19 Pandemic (e.g. social distancing) in an effort to limit this patient's exposure and mitigate transmission in our community.  Due to her co-morbid illnesses, this patient is at least at moderate risk for complications without adequate follow up.  This format is felt to be most appropriate for this patient at this time.  All issues noted in this document were discussed and addressed.  A limited physical exam was performed with this format.  Please refer to the patient's chart for her consent to telehealth for [facility].   Evaluation Performed: New patient visit  Date:  09/09/2018   ID:  Carol Byrd, DOB 23-Mar-1946, MRN [id number]  Patient Location: Home Provider Location: Office  PCP:  [name], MD  Consulting Cardiologist: Satira Sark, MD  Chief Complaint:  Palpitations   History of Present Illness:    Carol Byrd is a 73 y.o. female referred for cardiology consultation by Dr. Willey Blade for evaluation of palpitations.  We communicated initially by video/audio however due to technical difficulties converted later to phone conversation.  She tells me that approximately 1 month ago she felt an episode of palpitations and lightheadedness, almost as if things were closing in.  She sat down and symptoms passed in a few minutes, she did not have any syncope.  She does not recall any specific precipitant for this.  She does recall having brief episodes of palpitations in the past.  She was referred for a Holter monitor by Dr. Willey Blade back in March.  Results revealed sinus rhythm with rare PACs, brief bursts of atrial tachycardia no longer than 5 beats, and rare ventricular ectopy with an 11 beat run of NSVT.  She was monitored for 24 hours, tells me that she does not recall any specific symptoms while wearing the monitor.  She is retired, enjoys  working around her home.  She previously worked as a Building control surveyor.  The patient does not have symptoms concerning for COVID-19 infection (fever, chills, cough, or new shortness of breath).  She has been social distancing.    Past Medical History:  Diagnosis Date  . Alopecia   . Cervical disc disease   . Essential hypertension   . Hypothyroidism   . Impaired fasting glucose   . Mixed hyperlipidemia   . Vitamin D deficiency    Past Surgical History:  Procedure Laterality Date  . BACK SURGERY  1985   bulging lumbar disk  . BIOPSY  04/14/2018   Procedure: BIOPSY;  Surgeon: Rogene Houston, MD;  [facility];  Service: Endoscopy;;  esophagus  . cataracts    . COLONOSCOPY N/A 02/14/2015   Procedure: COLONOSCOPY;  Surgeon: Rogene Houston, MD;  [facility];  Service: Endoscopy;  Laterality: N/A;  1200  . ESOPHAGEAL DILATION N/A 04/14/2018   Procedure: ESOPHAGEAL DILATION;  Surgeon: Rogene Houston, MD;  [facility];  Service: Endoscopy;  Laterality: N/A;  . ESOPHAGOGASTRODUODENOSCOPY N/A 04/14/2018   Procedure: ESOPHAGOGASTRODUODENOSCOPY (EGD);  Surgeon: Rogene Houston, MD;  [facility];  Service: Endoscopy;  Laterality: N/A;  3:10  . RETINAL TEAR REPAIR CRYOTHERAPY    . THYROIDECTOMY       Current Meds  Medication Sig  . atorvastatin (LIPITOR) 20 MG tablet Take 20 mg by mouth. Patient states that she takes weekly.  . Biotin 5000 MCG TABS Take 5,000  mcg by mouth daily.  . Cholecalciferol (VITAMIN D3) 50 MCG (2000 UT) TABS Take 1 tablet by mouth daily.  Marland Kitchen ezetimibe (ZETIA) 10 MG tablet Take 10 mg by mouth daily.  . finasteride (PROSCAR) 5 MG tablet Take 2.5 mg by mouth daily.   Marland Kitchen levothyroxine (SYNTHROID) 88 MCG tablet Take 1 tablet by mouth daily.  Marland Kitchen telmisartan (MICARDIS) 40 MG tablet Take 1 tablet by mouth daily.     Allergies:   Patient has no known allergies.   Social History   Tobacco Use  . Smoking status: Former  Smoker    Types: Cigarettes    Last attempt to quit: 04/14/1994    Years since quitting: 24.4  . Smokeless tobacco: Never Used  Substance Use Topics  . Alcohol use: Yes    Alcohol/week: 2.0 standard drinks    Types: 2 Glasses of wine per week    Comment: Daily - wine  . Drug use: No     Family Hx: The patient's family history includes Breast cancer in her sister; Cancer in an other family member; Depression in her mother; Heart attack in her father; Heart disease in an other family member; Hypertension in her mother. There is no history of Colon cancer.  ROS:   Please see the history of present illness.    All other systems reviewed and are negative.   Prior CV studies:   The following studies were reviewed today:  Holter monitor 08/10/2018:  24 hr holter monitor  Min HR 49, Max HR 163, Avg HR 70  Rare supraventricular ectopy, primarily as PACs. Two short runs of atach longest 5 beats  Rare ventricular ectopy, primarily as isolated PVCs. 11 beat run of NSVT  No diary submitted  Labs/Other Tests and Data Reviewed:    EKG:  An ECG dated 08/10/2018 was personally reviewed today and demonstrated:  Normal sinus rhythm with nonspecific T wave changes.  Recent Labs:  February 2020: Cholesterol 189, triglycerides 127, HDL 56, LDL 108, AST 12  Wt Readings from Last 3 Encounters:  09/09/18 165 lb (74.8 kg)  04/14/18 167 lb 11.2 oz (76.1 kg)  04/13/18 167 lb 11.2 oz (76.1 kg)     Objective:    Vital Signs:  Ht 5' 6.5" (1.689 m)   Wt 165 lb (74.8 kg)   BMI 26.23 kg/m   General: Patient appears comfortable at rest. HEENT: Conjunctiva and lids normal, wearing glasses. Skin: Normal appearance. Lungs: Nonlabored breathing while speaking in full sentences, no audible wheezing. Neuropsychiatric: Affect is normal.  Speech pattern and voice tone are normal.  Moves all extremities.  ASSESSMENT & PLAN:    1.  Episode of palpitations and near syncope without precipitant as  described above.  She has had no recurrence in the last month.  24-hour Holter monitor showed rare PACs and PVCs with brief runs of both atrial tachycardia and NSVT.  She was not clearly symptomatic while wearing the monitor.  We discussed the results, plan at this time is to obtain an echocardiogram for baseline cardiac structural assessment, also BMET and magnesium to assess electrolytes.  If LVEF is normal would adopt strategy of observation for now.  If symptoms escalate will try to catch with a longer-term monitor and then could consider medical therapy.  2.  Essential hypertension, on Micardis with follow-up by Dr. Willey Blade.  3.  Mixed hyperlipidemia, on Lipitor and Zetia, recent LDL down to 108.  COVID-19 Education: The signs and symptoms of COVID-19 were discussed with the  patient and how to seek care for testing (follow up with PCP or arrange E-visit).  The importance of social distancing was discussed today.  Time:   Today, I have spent 19 minutes with the patient with telehealth technology discussing the above problems.     Medication Adjustments/Labs and Tests Ordered: Current medicines are reviewed at length with the patient today.  Concerns regarding medicines are outlined above.   Tests Ordered: Orders Placed This Encounter  Procedures  . Basic metabolic panel  . Magnesium  . ECHOCARDIOGRAM COMPLETE    Medication Changes: No orders of the defined types were placed in this encounter.   Disposition:  Follow up test results and determine disposition.  Signed, Rozann Lesches, MD  09/09/2018 9:23 AM    [place]

## 2018-09-07 NOTE — Telephone Encounter (Signed)
Virtual Visit Pre-Appointment Phone Call  Steps For Call:  1. Confirm consent - "In the setting of the current Covid19 crisis, you are scheduled for a (phone or video) visit with your provider on (date) at (time).  Just as we do with many in-office visits, in order for you to participate in this visit, we must obtain consent.  If you'd like, I can send this to your mychart (if signed up) or email for you to review.  Otherwise, I can obtain your verbal consent now.  All virtual visits are billed to your insurance company just like a normal visit would be.  By agreeing to a virtual visit, we'd like you to understand that the technology does not allow for your provider to perform an examination, and thus may limit your provider's ability to fully assess your condition. If your provider identifies any concerns that need to be evaluated in person, we will make arrangements to do so.  Finally, though the technology is pretty good, we cannot assure that it will always work on either your or our end, and in the setting of a video visit, we may have to convert it to a phone-only visit.  In either situation, we cannot ensure that we have a secure connection.  Are you willing to proceed?" STAFF: Did the patient verbally acknowledge consent to telehealth visit? Document YES/NO here: yes  2. Confirm the BEST phone number to call the day of the visit by including in appointment notes  3. Give patient instructions for MyChart download to smartphone OR [email or web address] as below if video visit (depending on what platform provider is using)  4. Confirm that appointment type is correct in Epic appointment notes (VIDEO vs PHONE)  5. Advise patient to be prepared with their blood pressure, heart rate, weight, any heart rhythm information, their current medicines, and a piece of paper and pen handy for any instructions they may receive the day of their visit  6. Inform patient they will receive a phone call 15 minutes  prior to their appointment time (may be from unknown caller ID) so they should be prepared to answer    TELEPHONE CALL NOTE  Carol Byrd has been deemed a candidate for a follow-up tele-health visit to limit community exposure during the Covid-19 pandemic. I spoke with the patient via phone to ensure availability of phone/video source, confirm preferred email & phone number, and discuss instructions and expectations.  I reminded Carol Byrd to be prepared with any vital sign and/or heart rhythm information that could potentially be obtained via home monitoring, at the time of her visit. I reminded Carol Byrd to expect a phone call prior to her visit.  Weston Anna 09/07/2018 10:32 AM   INSTRUCTIONS FOR DOWNLOADING THE MYCHART APP TO SMARTPHONE  - The patient must first make sure to have activated MyChart and know their login information - If Apple, go to [employer] and type in MyChart in the search bar and download the app. If Android, ask patient to go to [employer] and type in [place] in the search bar and download the app. The app is free but as with any other app downloads, their phone may require them to verify saved payment information or Apple/Android password.  - The patient will need to then log into the app with their MyChart username and password, and select [facility] as their healthcare provider to link the account. When it is time for your visit,  go to the MyChart app, find appointments, and click Begin Video Visit. Be sure to Select Allow for your device to access the Microphone and Camera for your visit. You will then be connected, and your provider will be with you shortly.  **If they have any issues connecting, or need assistance please contact MyChart service desk [phone])**  **If using a computer, in order to ensure the best quality for their visit they will need to use either of the following Internet Browsers: [employer],  or Google Chrome**  IF USING DOXIMITY or DOXY.ME - The patient will receive a link just prior to their visit by text.     FULL LENGTH CONSENT FOR TELE-HEALTH VISIT   I hereby voluntarily request, consent and authorize [facility] and its employed or contracted physicians, physician assistants, nurse practitioners or other licensed health care professionals (the Practitioner), to provide me with telemedicine health care services (the Services") as deemed necessary by the treating Practitioner. I acknowledge and consent to receive the Services by the Practitioner via telemedicine. I understand that the telemedicine visit will involve communicating with the Practitioner through live audiovisual communication technology and the disclosure of certain medical information by electronic transmission. I acknowledge that I have been given the opportunity to request an in-person assessment or other available alternative prior to the telemedicine visit and am voluntarily participating in the telemedicine visit.  I understand that I have the right to withhold or withdraw my consent to the use of telemedicine in the course of my care at any time, without affecting my right to future care or treatment, and that the Practitioner or I may terminate the telemedicine visit at any time. I understand that I have the right to inspect all information obtained and/or recorded in the course of the telemedicine visit and may receive copies of available information for a reasonable fee.  I understand that some of the potential risks of receiving the Services via telemedicine include:   Delay or interruption in medical evaluation due to technological equipment failure or disruption;  Information transmitted may not be sufficient (e.g. poor resolution of images) to allow for appropriate medical decision making by the Practitioner; and/or   In rare instances, security protocols could fail, causing a breach of personal health  information.  Furthermore, I acknowledge that it is my responsibility to provide information about my medical history, conditions and care that is complete and accurate to the best of my ability. I acknowledge that Practitioner's advice, recommendations, and/or decision may be based on factors not within their control, such as incomplete or inaccurate data provided by me or distortions of diagnostic images or specimens that may result from electronic transmissions. I understand that the practice of medicine is not an exact science and that Practitioner makes no warranties or guarantees regarding treatment outcomes. I acknowledge that I will receive a copy of this consent concurrently upon execution via email to the email address I last provided but may also request a printed copy by calling the office of [facility].    I understand that my insurance will be billed for this visit.   I have read or had this consent read to me.  I understand the contents of this consent, which adequately explains the benefits and risks of the Services being provided via telemedicine.   I have been provided ample opportunity to ask questions regarding this consent and the Services and have had my questions answered to my satisfaction.  I give my informed  consent for the services to be provided through the use of telemedicine in my medical care  By participating in this telemedicine visit I agree to the above.

## 2018-09-08 NOTE — Telephone Encounter (Signed)
Medications and allergies reviewed with patient  Unable to check BP, HR and Most recent weight 165 lbs & Ht 66 in  No recent hospitalizations   Patient verbally consented for telehealth visits with [facility] and understands that her insurance company will be billed for the encounter.

## 2018-09-09 ENCOUNTER — Telehealth: Payer: Self-pay | Admitting: Cardiology

## 2018-09-09 ENCOUNTER — Encounter: Payer: Self-pay | Admitting: Cardiology

## 2018-09-09 ENCOUNTER — Other Ambulatory Visit: Payer: Self-pay | Admitting: *Deleted

## 2018-09-09 ENCOUNTER — Telehealth (INDEPENDENT_AMBULATORY_CARE_PROVIDER_SITE_OTHER): Payer: Medicare Other | Admitting: Cardiology

## 2018-09-09 VITALS — Ht 66.5 in | Wt 165.0 lb

## 2018-09-09 DIAGNOSIS — R002 Palpitations: Secondary | ICD-10-CM

## 2018-09-09 DIAGNOSIS — I472 Ventricular tachycardia: Secondary | ICD-10-CM

## 2018-09-09 DIAGNOSIS — I4729 Other ventricular tachycardia: Secondary | ICD-10-CM

## 2018-09-09 DIAGNOSIS — R55 Syncope and collapse: Secondary | ICD-10-CM

## 2018-09-09 DIAGNOSIS — E782 Mixed hyperlipidemia: Secondary | ICD-10-CM

## 2018-09-09 DIAGNOSIS — I10 Essential (primary) hypertension: Secondary | ICD-10-CM | POA: Diagnosis not present

## 2018-09-09 DIAGNOSIS — Z7189 Other specified counseling: Secondary | ICD-10-CM

## 2018-09-09 NOTE — Patient Instructions (Addendum)
Medication Instructions:   Your physician recommends that you continue on your current medications as directed. Please refer to the Current Medication list given to you today.  Labwork:  Your physician recommends that you return for lab work to check your BMET & Magnesium levels. You can have this done at [facility]. Your lab orders will be faxed to their lab.  Testing/Procedures: Your physician has requested that you have an echocardiogram. Echocardiography is a painless test that uses sound waves to create images of your heart. It provides your doctor with information about the size and shape of your heart and how well your heart's chambers and valves are working. This procedure takes approximately one hour. There are no restrictions for this procedure. This will be scheduled at [place]:  Your physician recommends that you schedule a follow-up appointment in: pending test results.  Any Other Special Instructions Will Be Listed Below (If Applicable).  If you need a refill on your cardiac medications before your next appointment, please call your pharmacy.

## 2018-09-09 NOTE — Telephone Encounter (Signed)
Pre-cert Verification for the following procedure    Echo scheduled for 09/10/2018 at [facility]

## 2018-09-10 ENCOUNTER — Other Ambulatory Visit: Payer: Self-pay

## 2018-09-10 ENCOUNTER — Ambulatory Visit (HOSPITAL_COMMUNITY)
Admission: RE | Admit: 2018-09-10 | Discharge: 2018-09-10 | Disposition: A | Discharge status: Home / Self Care | Payer: Medicare Other | Source: Ambulatory Visit | Attending: Cardiology | Admitting: Cardiology

## 2018-09-10 ENCOUNTER — Other Ambulatory Visit (HOSPITAL_COMMUNITY)
Admission: RE | Admit: 2018-09-10 | Discharge: 2018-09-10 | Disposition: A | Discharge status: Home / Self Care | Payer: Medicare Other | Source: Ambulatory Visit | Attending: Cardiology | Admitting: Cardiology

## 2018-09-10 DIAGNOSIS — R002 Palpitations: Secondary | ICD-10-CM | POA: Insufficient documentation

## 2018-09-10 DIAGNOSIS — I4729 Other ventricular tachycardia: Secondary | ICD-10-CM

## 2018-09-10 DIAGNOSIS — I472 Ventricular tachycardia: Secondary | ICD-10-CM | POA: Insufficient documentation

## 2018-09-10 DIAGNOSIS — R55 Syncope and collapse: Secondary | ICD-10-CM

## 2018-09-10 LAB — BASIC METABOLIC PANEL
Anion gap: 9 (ref 5–15)
BUN: 18 mg/dL (ref 8–23)
CO2: 25 mmol/L (ref 22–32)
Calcium: 8.9 mg/dL (ref 8.9–10.3)
Chloride: 105 mmol/L (ref 98–111)
Creatinine, Ser: 0.97 mg/dL (ref 0.44–1.00)
GFR calc Af Amer: >60 mL/min (ref >60)
GFR calc non Af Amer: 58 mL/min — ABNORMAL LOW (ref >60)
Glucose, Bld: 105 mg/dL — ABNORMAL HIGH (ref 70–99)
Potassium: 4.6 mmol/L (ref 3.5–5.1)
Sodium: 139 mmol/L (ref 135–145)

## 2018-09-10 LAB — MAGNESIUM: Magnesium: 2 mg/dL (ref 1.7–2.4)

## 2018-09-10 NOTE — Progress Notes (Signed)
*  PRELIMINARY RESULTS* Echocardiogram 2D Echocardiogram has been performed.  Samuel Germany 09/10/2018, 1:36 PM

## 2018-09-13 ENCOUNTER — Telehealth: Payer: Self-pay | Admitting: *Deleted

## 2018-09-13 NOTE — Telephone Encounter (Signed)
-----   Message from Satira Sark, MD sent at 09/12/2018 10:34 AM EDT ----- Results reviewed.  LVEF is normal range without wall motion abnormalities, overall reassuring in terms of arrhythmia.  Continue with strategy of observation as discussed in recent consultation. A copy of this test should be forwarded to [name], MD.

## 2018-09-13 NOTE — Telephone Encounter (Signed)
-----   Message from Satira Sark, MD sent at 09/12/2018 10:36 AM EDT ----- Results reviewed.  Potassium and magnesium levels are normal. A copy of this test should be forwarded to [name], MD.

## 2018-09-13 NOTE — Telephone Encounter (Signed)
Returned call

## 2018-09-13 NOTE — Telephone Encounter (Signed)
Patient informed and verbalized understanding. Copy sent to PCP 

## 2018-10-15 DIAGNOSIS — I10 Essential (primary) hypertension: Secondary | ICD-10-CM | POA: Diagnosis not present

## 2018-10-15 DIAGNOSIS — R002 Palpitations: Secondary | ICD-10-CM | POA: Diagnosis not present

## 2018-10-20 DIAGNOSIS — Z9889 Other specified postprocedural states: Secondary | ICD-10-CM | POA: Diagnosis not present

## 2018-10-20 DIAGNOSIS — Z961 Presence of intraocular lens: Secondary | ICD-10-CM | POA: Diagnosis not present

## 2018-10-21 ENCOUNTER — Other Ambulatory Visit (HOSPITAL_COMMUNITY): Payer: Self-pay | Admitting: Internal Medicine

## 2018-10-21 DIAGNOSIS — Z1231 Encounter for screening mammogram for malignant neoplasm of breast: Secondary | ICD-10-CM

## 2018-10-27 ENCOUNTER — Encounter (HOSPITAL_COMMUNITY): Payer: Self-pay

## 2018-10-27 ENCOUNTER — Ambulatory Visit (HOSPITAL_COMMUNITY)
Admission: RE | Admit: 2018-10-27 | Discharge: 2018-10-27 | Disposition: A | Discharge status: Home / Self Care | Payer: Medicare Other | Source: Ambulatory Visit | Attending: Internal Medicine | Admitting: Internal Medicine

## 2018-10-27 ENCOUNTER — Other Ambulatory Visit: Payer: Self-pay

## 2018-10-27 DIAGNOSIS — Z1231 Encounter for screening mammogram for malignant neoplasm of breast: Secondary | ICD-10-CM | POA: Diagnosis not present

## 2018-10-28 ENCOUNTER — Other Ambulatory Visit (HOSPITAL_COMMUNITY): Payer: Self-pay | Admitting: Internal Medicine

## 2018-10-28 DIAGNOSIS — R928 Other abnormal and inconclusive findings on diagnostic imaging of breast: Secondary | ICD-10-CM

## 2018-11-02 ENCOUNTER — Ambulatory Visit (HOSPITAL_COMMUNITY)
Admission: RE | Admit: 2018-11-02 | Discharge: 2018-11-02 | Disposition: A | Discharge status: Home / Self Care | Payer: Medicare Other | Source: Ambulatory Visit | Attending: Internal Medicine | Admitting: Internal Medicine

## 2018-11-02 ENCOUNTER — Other Ambulatory Visit: Payer: Self-pay

## 2018-11-02 DIAGNOSIS — R928 Other abnormal and inconclusive findings on diagnostic imaging of breast: Secondary | ICD-10-CM | POA: Insufficient documentation

## 2018-11-02 DIAGNOSIS — N6321 Unspecified lump in the left breast, upper outer quadrant: Secondary | ICD-10-CM | POA: Diagnosis not present

## 2018-11-02 DIAGNOSIS — R922 Inconclusive mammogram: Secondary | ICD-10-CM | POA: Diagnosis not present

## 2018-11-03 ENCOUNTER — Other Ambulatory Visit: Payer: Self-pay | Admitting: Internal Medicine

## 2018-11-03 DIAGNOSIS — N6320 Unspecified lump in the left breast, unspecified quadrant: Secondary | ICD-10-CM

## 2018-11-08 ENCOUNTER — Ambulatory Visit
Admission: RE | Admit: 2018-11-08 | Discharge: 2018-11-08 | Disposition: A | Discharge status: Home / Self Care | Payer: Medicare Other | Source: Ambulatory Visit | Attending: Internal Medicine | Admitting: Internal Medicine

## 2018-11-08 ENCOUNTER — Other Ambulatory Visit: Payer: Self-pay

## 2018-11-08 DIAGNOSIS — C50412 Malignant neoplasm of upper-outer quadrant of left female breast: Secondary | ICD-10-CM | POA: Diagnosis not present

## 2018-11-08 DIAGNOSIS — N6320 Unspecified lump in the left breast, unspecified quadrant: Secondary | ICD-10-CM

## 2018-11-08 DIAGNOSIS — Z171 Estrogen receptor negative status [ER-]: Secondary | ICD-10-CM | POA: Diagnosis not present

## 2018-11-08 DIAGNOSIS — N6321 Unspecified lump in the left breast, upper outer quadrant: Secondary | ICD-10-CM | POA: Diagnosis not present

## 2018-11-12 ENCOUNTER — Other Ambulatory Visit: Payer: Self-pay | Admitting: General Surgery

## 2018-11-12 DIAGNOSIS — C50412 Malignant neoplasm of upper-outer quadrant of left female breast: Secondary | ICD-10-CM | POA: Diagnosis not present

## 2018-11-12 DIAGNOSIS — C50912 Malignant neoplasm of unspecified site of left female breast: Secondary | ICD-10-CM | POA: Diagnosis not present

## 2018-11-15 ENCOUNTER — Telehealth: Payer: Self-pay | Admitting: Licensed Clinical Social Worker

## 2018-11-15 NOTE — Telephone Encounter (Signed)
Received an urgent genetic counseling appt from Dr. [name] for brca. Pt has been scheduled to see Carol Byrd on 7/2 at 3pm. She declined a webex visit. Aware to arrive 15 minutes early.

## 2018-11-16 ENCOUNTER — Telehealth: Payer: Self-pay | Admitting: Hematology and Oncology

## 2018-11-16 NOTE — Telephone Encounter (Signed)
Received a new patient referral from Dr. [name] for breast cancer. Pt has been cld and scheduled to see Dr. Lindi Adie on 7/6 at 3:15pm. She's been made aware to arrive 15 minutes early.

## 2018-11-17 ENCOUNTER — Other Ambulatory Visit: Payer: Self-pay

## 2018-11-17 ENCOUNTER — Telehealth (INDEPENDENT_AMBULATORY_CARE_PROVIDER_SITE_OTHER): Payer: Self-pay | Admitting: Internal Medicine

## 2018-11-17 ENCOUNTER — Ambulatory Visit (HOSPITAL_COMMUNITY)
Admission: RE | Admit: 2018-11-17 | Discharge: 2018-11-17 | Disposition: A | Discharge status: Home / Self Care | Payer: Medicare Other | Source: Ambulatory Visit | Attending: General Surgery | Admitting: General Surgery

## 2018-11-17 DIAGNOSIS — Z171 Estrogen receptor negative status [ER-]: Secondary | ICD-10-CM | POA: Insufficient documentation

## 2018-11-17 DIAGNOSIS — C3492 Malignant neoplasm of unspecified part of left bronchus or lung: Secondary | ICD-10-CM | POA: Diagnosis not present

## 2018-11-17 DIAGNOSIS — C50412 Malignant neoplasm of upper-outer quadrant of left female breast: Secondary | ICD-10-CM | POA: Insufficient documentation

## 2018-11-17 LAB — POCT I-STAT CREATININE: Creatinine, Ser: 1.1 mg/dL — ABNORMAL HIGH (ref 0.44–1.00)

## 2018-11-17 MED ORDER — GADOBUTROL 1 MMOL/ML IV SOLN
8.0000 mL | Freq: Once | INTRAVENOUS | Status: AC | PRN
  Administered 2018-11-17: 8 mL via INTRAVENOUS

## 2018-11-17 NOTE — Telephone Encounter (Signed)
Forwarded to Dr.Rehman as a reminder.

## 2018-11-17 NOTE — Telephone Encounter (Signed)
Patient called stated she has been diagnosed with stage 1 breast cancer - would like to speak with Dr Laural Golden before she starts chemo  -  about some issues she has had - ph# [phone]

## 2018-11-18 ENCOUNTER — Encounter: Payer: Self-pay | Admitting: Genetic Counselor

## 2018-11-18 ENCOUNTER — Inpatient Hospital Stay: Payer: Medicare Other

## 2018-11-18 ENCOUNTER — Inpatient Hospital Stay: Payer: Medicare Other | Attending: Genetic Counselor | Admitting: Genetic Counselor

## 2018-11-18 ENCOUNTER — Other Ambulatory Visit: Payer: Self-pay

## 2018-11-18 DIAGNOSIS — Z803 Family history of malignant neoplasm of breast: Secondary | ICD-10-CM | POA: Diagnosis not present

## 2018-11-18 DIAGNOSIS — Z1379 Encounter for other screening for genetic and chromosomal anomalies: Secondary | ICD-10-CM

## 2018-11-18 DIAGNOSIS — Z807 Family history of other malignant neoplasms of lymphoid, hematopoietic and related tissues: Secondary | ICD-10-CM

## 2018-11-18 DIAGNOSIS — Z7289 Other problems related to lifestyle: Secondary | ICD-10-CM | POA: Insufficient documentation

## 2018-11-18 DIAGNOSIS — C50412 Malignant neoplasm of upper-outer quadrant of left female breast: Secondary | ICD-10-CM

## 2018-11-18 DIAGNOSIS — Z87891 Personal history of nicotine dependence: Secondary | ICD-10-CM | POA: Insufficient documentation

## 2018-11-18 DIAGNOSIS — Z801 Family history of malignant neoplasm of trachea, bronchus and lung: Secondary | ICD-10-CM | POA: Diagnosis not present

## 2018-11-18 DIAGNOSIS — Z800 Family history of malignant neoplasm of digestive organs: Secondary | ICD-10-CM

## 2018-11-18 DIAGNOSIS — Z5111 Encounter for antineoplastic chemotherapy: Secondary | ICD-10-CM | POA: Insufficient documentation

## 2018-11-18 DIAGNOSIS — D701 Agranulocytosis secondary to cancer chemotherapy: Secondary | ICD-10-CM | POA: Insufficient documentation

## 2018-11-18 DIAGNOSIS — Z808 Family history of malignant neoplasm of other organs or systems: Secondary | ICD-10-CM | POA: Diagnosis not present

## 2018-11-18 DIAGNOSIS — Z171 Estrogen receptor negative status [ER-]: Secondary | ICD-10-CM | POA: Insufficient documentation

## 2018-11-18 NOTE — Progress Notes (Signed)
REFERRING PROVIDER: Rolm Bookbinder, MD [address] [place],  [place] [address]  PRIMARY PROVIDER:  [name], MD  PRIMARY REASON FOR VISIT:  1. Malignant neoplasm of upper-outer quadrant of left breast in female, estrogen receptor negative ([place])   2. Family history of breast cancer   3. Family history of melanoma   4. Family history of lung cancer   5. Family history of stomach cancer   6. Family history of multiple myeloma      HISTORY OF PRESENT ILLNESS:   Carol Byrd, a 73 y.o. female, was seen for a [place] cancer genetics consultation at the request of Dr. [name] due to a personal and family history of breast cancer.  Carol Byrd presents to clinic today to discuss the possibility of a hereditary predisposition to cancer, genetic testing, and to further clarify her future cancer risks, as well as potential cancer risks for family members.   In 2020, at the age of 65, Carol Byrd was diagnosed with IDC of the left breast. The treatment plan includes chemotherapy and surgery.   CANCER HISTORY:  Oncology History   No history exists.     RISK FACTORS:  Menarche was at age 65.   Ovaries intact: yes.  Hysterectomy: yes.  Menopausal status: postmenopausal.  HRT use: 0 years. Mammogram within the last year: yes. Number of breast biopsies: 1.  Past Medical History:  Diagnosis Date  . Alopecia   . Breast cancer ([place])   . Cervical disc disease   . Essential hypertension   . Family history of breast cancer   . Family history of lung cancer   . Family history of melanoma   . Family history of multiple myeloma   . Family history of stomach cancer   . Hypothyroidism   . Impaired fasting glucose   . Mixed hyperlipidemia   . Vitamin D deficiency     Past Surgical History:  Procedure Laterality Date  . BACK SURGERY  1985   bulging lumbar disk  . BIOPSY  04/14/2018   Procedure: BIOPSY;  Surgeon: Rogene Houston, MD;  [facility];   Service: Endoscopy;;  esophagus  . cataracts    . COLONOSCOPY N/A 02/14/2015   Procedure: COLONOSCOPY;  Surgeon: Rogene Houston, MD;  [facility];  Service: Endoscopy;  Laterality: N/A;  1200  . ESOPHAGEAL DILATION N/A 04/14/2018   Procedure: ESOPHAGEAL DILATION;  Surgeon: Rogene Houston, MD;  [facility];  Service: Endoscopy;  Laterality: N/A;  . ESOPHAGOGASTRODUODENOSCOPY N/A 04/14/2018   Procedure: ESOPHAGOGASTRODUODENOSCOPY (EGD);  Surgeon: Rogene Houston, MD;  [facility];  Service: Endoscopy;  Laterality: N/A;  3:10  . RETINAL TEAR REPAIR CRYOTHERAPY    . THYROIDECTOMY      Social History   Socioeconomic History  . Marital status: Married    Spouse Byrd: Not on file  . Number of children: Not on file  . Years of education: Not on file  . Highest education level: Not on file  Occupational History  . Occupation: retired  Scientific laboratory technician  . Financial resource strain: Not on file  . Food insecurity    Worry: Not on file    Inability: Not on file  . Transportation needs    Medical: Not on file    Non-medical: Not on file  Tobacco Use  . Smoking status: Former Smoker    Types: Cigarettes    Quit date: 04/14/1994    Years  since quitting: 24.6  . Smokeless tobacco: Never Used  Substance and Sexual Activity  . Alcohol use: Yes    Alcohol/week: 2.0 standard drinks    Types: 2 Glasses of wine per week    Comment: Daily - wine  . Drug use: No  . Sexual activity: Not on file  Lifestyle  . Physical activity    Days per week: Not on file    Minutes per session: Not on file  . Stress: Not on file  Relationships  . Social Herbalist on phone: Not on file    Gets together: Not on file    Attends religious service: Not on file    Active member of club or organization: Not on file    Attends meetings of clubs or organizations: Not on file    Relationship status: Not on file  Other Topics Concern  . Not on file  Social History  Narrative  . Not on file     FAMILY HISTORY:  We obtained a detailed, 4-generation family history.  Significant diagnoses are listed below: Family History  Problem Relation Age of Onset  . Depression Mother   . Hypertension Mother   . Heart attack Father   . Heart disease Other   . Multiple myeloma Cousin 49       maternal first cousin  . Breast cancer Sister 48       second breast cancer diagnosed at 37  . Breast cancer Maternal Aunt 80  . Cancer Maternal Uncle        unknown  . Stomach cancer Maternal Grandfather 80  . Lung cancer Maternal Uncle 84  . Lung cancer Maternal Uncle 76  . Lung cancer Cousin        maternal first cousins  . Cancer Cousin        unknown, paternal first cousins  . Colon cancer Neg Hx     Carol Byrd has a sister who was first diagnosed with breast cancer at age 51, then diagnosed with breast cancer in the other breast at age 3. This sister was also diagnosed with melanoma at age 43. Carol Byrd sister had genetic testing, possibly after her first diagnosis of breast cancer. The results were reportedly negative, although they were not available to be reviewed at today's appointment.  Carol Byrd mother had a hysterectomy after a "suspicious" pap smear. She has two maternal aunts, one had breast cancer diagnosed in her 73s and the other maternal aunt did not have cancer. Carol Byrd has 8 maternal uncles. She suspects that all or most of her maternal uncles may have had cancer. She specified that two of her maternal uncles may have had lung cancer, although she is largely unsure what type of cancer they had. Carol Byrd has multiple maternal first cousins with cancer, including multiple myeloma in one cousin and lung cancer in two others.  Carol Byrd has two paternal first cousins may have been diagnosed with cancer, but she is unsure of what type.  Ms. Espinoza maternal ancestors are of European descent, and paternal ancestors are of  European descent. There no reported Ashkenazi Jewish ancestry. There no known consanguinity.  GENETIC COUNSELING ASSESSMENT: Carol Byrd is a 73 y.o. female with a personal and family history of breast cancer which is somewhat suggestive of a hereditary cancer syndrome and predisposition to cancer. We, therefore, discussed and recommended the following at today's visit.   DISCUSSION: We discussed that 5 - 10% of breast  cancer is hereditary, with most cases associated with BRCA1/2.  There are other genes that can be associated with hereditary breast cancer syndromes.  These include ATM, PALB2, CHEK2, etc.  We discussed that testing is beneficial for several reasons including surgical decision-making for breast cancer, knowing how to follow individuals after completing their treatment, and understanding if other family members could be at risk for cancer and allow them to undergo genetic testing.   We reviewed the characteristics, features and inheritance patterns of hereditary cancer syndromes. We also discussed genetic testing, including the appropriate family members to test, the process of testing, insurance coverage and turn-around-time for results. We discussed the implications of a negative, positive and/or variant of uncertain significant result. Carol Byrd expressed that she was only interested in genetic testing for genes that would be informative for her personal history of breast cancer and upcoming surgery. In order to get genetic test results in a timely manner so that Carol Byrd can use these genetic test results for surgical decisions, we recommended Carol Byrd pursue genetic testing for the [facility] Breast Cancer STAT gene panel.   The STAT Breast cancer panel offered by [facility] includes sequencing and rearrangement analysis for the following 7 genes:  BRCA1, BRCA2, CDH1, PALB2, PTEN, STK11 and TP53.    Based on Carol Byrd's personal and family history of cancer, she meets medical  criteria for genetic testing. Despite that she meets criteria, she may still have an out of pocket cost.   PLAN: After considering the risks, benefits, and limitations, Carol Byrd provided informed consent to pursue genetic testing and the blood sample was sent to [facility] for analysis of the Breast Cancer STAT gene panel. Results should be available within approximately one weeks' time, at which point they will be disclosed by telephone to Carol Byrd, as will any additional recommendations warranted by these results. Carol Byrd will receive a summary of her genetic counseling visit and a copy of her results once available. This information will also be available in Epic.   Carol Byrd questions were answered to her satisfaction today. Our contact information was provided should additional questions or concerns arise. Thank you for the referral and allowing [place] to share in the care of your patient.   Clint Guy, MS Genetic Counselor [email or web address] Phone: [phone]   The patient was seen for a total of 60 minutes in face-to-face genetic counseling.  This patient was discussed with Drs. Magrinat, Lindi Adie and/or Burr Medico who agrees with the above.    _______________________________________________________________________ For Office Staff:  Number of people involved in session: 1 Was an Intern/ student involved with case: no

## 2018-11-20 NOTE — Progress Notes (Signed)
[facility] CONSULT NOTE  Patient Care Team: [name], MD as PCP - General (Internal Medicine) Satira Sark, MD as PCP - Cardiology (Cardiology)  CHIEF COMPLAINTS/PURPOSE OF CONSULTATION:  Newly diagnosed breast cancer  HISTORY OF PRESENTING ILLNESS:  Carol Byrd 73 y.o. female is here because of recent diagnosis of invasive ductal carcinoma of the left breast. The cancer was detected on a routine screening mammogram on 10/27/18. Diagnostic mammogram on 11/02/18 showed 2 adjacent masses in the left breast measuring collectively 2.9cm. US showed 2 masses at the 2 o'clock position measuring 1.6cm and 1.1cm, with no lymphadenopathy seen in the left axilla. Biopsy on 11/08/18 showed grade 3 IDC with DCIS, HER2 negative by FISH, ER/PR negative, Ki67 80%. She has a family history of recurrent breast cancer and melanoma in her sister and breast cancer in her maternal aunt. She presents to the clinic today for initial evaluation and discussion of treatment options.   I reviewed her records extensively and collaborated the history with the patient.  SUMMARY OF ONCOLOGIC HISTORY: Oncology History  Malignant neoplasm of upper-outer quadrant of left breast in female, estrogen receptor negative ([place])  11/08/2018 Initial Diagnosis   Routine screening mammogram detected 2 masses in the left breast, measuring 1.6cm and 1.1cm, spanning a total of 3cm. Biopsy confirmed IDC with DCIS, HER-2 negative by FISH (2+ by IHC), ER/PR negative, Ki67 80%.    11/18/2018 Breast MRI   2.9 centimeter bilobed mass in the UOQ of the left breast, consistent with known malignancy. 0.5 centimeter satellite nodule is 0.5 centimeters below this mass.   11/22/2018 Cancer Staging   Staging form: Breast, AJCC 8th Edition - Clinical stage from 11/22/2018: Stage IIB (cT2, cN0, cM0, G3, ER-, PR-, HER2-) - Signed by Carol Lose, MD on 11/22/2018     MEDICAL HISTORY:  Past Medical History:  Diagnosis Date  .  Alopecia   . Breast cancer ([place])   . Cervical disc disease   . Essential hypertension   . Family history of breast cancer   . Family history of lung cancer   . Family history of melanoma   . Family history of multiple myeloma   . Family history of stomach cancer   . Hypothyroidism   . Impaired fasting glucose   . Mixed hyperlipidemia   . Vitamin D deficiency     SURGICAL HISTORY: Past Surgical History:  Procedure Laterality Date  . BACK SURGERY  1985   bulging lumbar disk  . BIOPSY  04/14/2018   Procedure: BIOPSY;  Surgeon: Rogene Houston, MD;  [facility];  Service: Endoscopy;;  esophagus  . cataracts    . COLONOSCOPY N/A 02/14/2015   Procedure: COLONOSCOPY;  Surgeon: Rogene Houston, MD;  [facility];  Service: Endoscopy;  Laterality: N/A;  1200  . ESOPHAGEAL DILATION N/A 04/14/2018   Procedure: ESOPHAGEAL DILATION;  Surgeon: Rogene Houston, MD;  [facility];  Service: Endoscopy;  Laterality: N/A;  . ESOPHAGOGASTRODUODENOSCOPY N/A 04/14/2018   Procedure: ESOPHAGOGASTRODUODENOSCOPY (EGD);  Surgeon: Rogene Houston, MD;  [facility];  Service: Endoscopy;  Laterality: N/A;  3:10  . RETINAL TEAR REPAIR CRYOTHERAPY    . THYROIDECTOMY      SOCIAL HISTORY: Social History   Socioeconomic History  . Marital status: Married    Spouse name: Not on file  . Number of children: Not on file  . Years of education: Not on file  . Highest education level: Not  on file  Occupational History  . Occupation: retired  Scientific laboratory technician  . Financial resource strain: Not on file  . Food insecurity    Worry: Not on file    Inability: Not on file  . Transportation needs    Medical: Not on file    Non-medical: Not on file  Tobacco Use  . Smoking status: Former Smoker    Types: Cigarettes    Quit date: 04/14/1994    Years since quitting: 24.6  . Smokeless tobacco: Never Used  Substance and Sexual Activity  . Alcohol use: Yes     Alcohol/week: 2.0 standard drinks    Types: 2 Glasses of wine per week    Comment: Daily - wine  . Drug use: No  . Sexual activity: Not on file  Lifestyle  . Physical activity    Days per week: Not on file    Minutes per session: Not on file  . Stress: Not on file  Relationships  . Social Herbalist on phone: Not on file    Gets together: Not on file    Attends religious service: Not on file    Active member of club or organization: Not on file    Attends meetings of clubs or organizations: Not on file    Relationship status: Not on file  . Intimate partner violence    Fear of current or ex partner: Not on file    Emotionally abused: Not on file    Physically abused: Not on file    Forced sexual activity: Not on file  Other Topics Concern  . Not on file  Social History Narrative  . Not on file    FAMILY HISTORY: Family History  Problem Relation Age of Onset  . Depression Mother   . Hypertension Mother   . Heart attack Father   . Heart disease Other   . Multiple myeloma Cousin 55       maternal first cousin  . Breast cancer Sister 40       second breast cancer diagnosed at 27  . Breast cancer Maternal Aunt 80  . Cancer Maternal Uncle        unknown  . Stomach cancer Maternal Grandfather 80  . Lung cancer Maternal Uncle 8  . Lung cancer Maternal Uncle 63  . Lung cancer Cousin        maternal first cousins  . Cancer Cousin        unknown, paternal first cousins  . Colon cancer Neg Hx     ALLERGIES:  has No Known Allergies.  MEDICATIONS:  Current Outpatient Medications  Medication Sig Dispense Refill  . atorvastatin (LIPITOR) 20 MG tablet Take 20 mg by mouth. Patient states that she takes weekly.    . Biotin 5000 MCG TABS Take 5,000 mcg by mouth daily. 30 tablet   . Cholecalciferol (VITAMIN D3) 50 MCG (2000 UT) TABS Take 1 tablet by mouth daily.    Marland Kitchen ezetimibe (ZETIA) 10 MG tablet Take 10 mg by mouth daily.    . finasteride (PROSCAR) 5 MG tablet  Take 2.5 mg by mouth daily.     Marland Kitchen levothyroxine (SYNTHROID) 88 MCG tablet Take 1 tablet by mouth daily.    Marland Kitchen telmisartan (MICARDIS) 40 MG tablet Take 1 tablet by mouth daily.     No current facility-administered medications for this visit.     REVIEW OF SYSTEMS:   Constitutional: Denies fevers, chills or abnormal night sweats Eyes: Denies blurriness  of vision, double vision or watery eyes Ears, nose, mouth, throat, and face: Denies mucositis or sore throat Respiratory: Denies cough, dyspnea or wheezes Cardiovascular: Denies palpitation, chest discomfort or lower extremity swelling Gastrointestinal:  Denies nausea, heartburn or change in bowel habits Skin: Denies abnormal skin rashes Lymphatics: Denies new lymphadenopathy or easy bruising Neurological:Denies numbness, tingling or new weaknesses Behavioral/Psych: Mood is stable, no new changes  Breast: Palpable left breast lumps All other systems were reviewed with the patient and are negative.  PHYSICAL EXAMINATION: ECOG PERFORMANCE STATUS: 1 - Symptomatic but completely ambulatory  Vitals:   11/22/18 1511  BP: (!) 151/68  Pulse: 85  Resp: 16  Temp: 99.1 F (37.3 C)  SpO2: 95%   Filed Weights   11/22/18 1511  Weight: 170 lb 9.6 oz (77.4 kg)    GENERAL:alert, no distress and comfortable SKIN: skin color, texture, turgor are normal, no rashes or significant lesions EYES: normal, conjunctiva are pink and non-injected, sclera clear OROPHARYNX:no exudate, no erythema and lips, buccal mucosa, and tongue normal  NECK: supple, thyroid normal size, non-tender, without nodularity LYMPH:  no palpable lymphadenopathy in the cervical, axillary or inguinal LUNGS: clear to auscultation and percussion with normal breathing effort HEART: regular rate & rhythm and no murmurs and no lower extremity edema ABDOMEN:abdomen soft, non-tender and normal bowel sounds Musculoskeletal:no cyanosis of digits and no clubbing  PSYCH: alert & oriented  x 3 with fluent speech NEURO: no focal motor/sensory deficits   LABORATORY DATA:  I have reviewed the data as listed No results found for: WBC, HGB, HCT, MCV, PLT Lab Results  Component Value Date   NA 139 09/10/2018   K 4.6 09/10/2018   CL 105 09/10/2018   CO2 25 09/10/2018    RADIOGRAPHIC STUDIES: I have personally reviewed the radiological reports and agreed with the findings in the report.  ASSESSMENT AND PLAN:  Malignant neoplasm of upper-outer quadrant of left breast in female, estrogen receptor negative ([place]) 11/08/2018: Routine screening mammogram detected 2 masses in the left breast, measuring 1.6cm and 1.1cm, spanning a total of 3cm. Biopsy confirmed IDC with DCIS, HER-2 negative by FISH (2+ by IHC), ER/PR negative, Ki67 80%.  Staging: T2N0 stage IIb clinical stage Breast MRI: 11/18/2018: 2.9 cm bilobed mass UOQ left breast  Pathology and radiology counseling: Discussed with the patient, the details of pathology including the type of breast cancer,the clinical staging, the significance of ER, PR and HER-2/neu receptors and the implications for treatment. After reviewing the pathology in detail, we proceeded to discuss the different treatment options between surgery, radiation, chemotherapy, antiestrogen therapies.  Recommendation: 1.  Neoadjuvant chemotherapy with dose dense Adriamycin and Cytoxan x4 followed by Taxol and carboplatin 2. Breast conserving surgery with sentinel lymph node biopsy 2.  Followed by adjuvant radiation  Chemotherapy Counseling: I discussed the risks and benefits of chemotherapy including the risks of nausea/ vomiting, risk of infection from low WBC count, fatigue due to chemo or anemia, bruising or bleeding due to low platelets, mouth sores, loss/ change in taste and decreased appetite. Liver and kidney function will be monitored through out chemotherapy as abnormalities in liver and kidney function may be a side effect of treatment. Risk of permanent  bone marrow dysfunction and leukemia due to chemo were also discussed.  We also discussed the neuropathy risk from Taxotere.  Discussed the risks of cardiotoxicity from Adriamycin.  Patient is doing great as well and is deciding whether she wants to go to [place] for her chemotherapy.  I discussed with her that the treatment would be the same whether whenever she gets treated.  She will think about it and inform us tomorrow.  UPBEAT clinical trial ([id number]): Newly diagnosed stage I to III breast cancer patients receiving either adjuvant or neoadjuvant chemotherapy undergo cardiac MRI before treatment and at 24 months along with neurocognitive testing, exercise and disability measures at baseline 3, 12 and 24 months. She is interested in this clinical trial and is willing to come to [facility] for any visits necessary for the trial.  All questions were answered. The patient knows to call the clinic with any problems, questions or concerns.   Carol Eisenmenger, MD 11/22/2018    I, Carol Byrd, am acting as scribe for Carol Lose, MD.  I have reviewed the above documentation for accuracy and completeness, and I agree with the above.

## 2018-11-22 ENCOUNTER — Other Ambulatory Visit: Payer: Self-pay

## 2018-11-22 ENCOUNTER — Encounter: Payer: Self-pay | Admitting: *Deleted

## 2018-11-22 ENCOUNTER — Inpatient Hospital Stay (HOSPITAL_BASED_OUTPATIENT_CLINIC_OR_DEPARTMENT_OTHER): Payer: Medicare Other | Admitting: Hematology and Oncology

## 2018-11-22 DIAGNOSIS — Z800 Family history of malignant neoplasm of digestive organs: Secondary | ICD-10-CM

## 2018-11-22 DIAGNOSIS — Z803 Family history of malignant neoplasm of breast: Secondary | ICD-10-CM | POA: Diagnosis not present

## 2018-11-22 DIAGNOSIS — Z808 Family history of malignant neoplasm of other organs or systems: Secondary | ICD-10-CM

## 2018-11-22 DIAGNOSIS — Z171 Estrogen receptor negative status [ER-]: Secondary | ICD-10-CM

## 2018-11-22 DIAGNOSIS — D701 Agranulocytosis secondary to cancer chemotherapy: Secondary | ICD-10-CM | POA: Diagnosis not present

## 2018-11-22 DIAGNOSIS — Z87891 Personal history of nicotine dependence: Secondary | ICD-10-CM | POA: Diagnosis not present

## 2018-11-22 DIAGNOSIS — Z5111 Encounter for antineoplastic chemotherapy: Secondary | ICD-10-CM | POA: Diagnosis not present

## 2018-11-22 DIAGNOSIS — Z801 Family history of malignant neoplasm of trachea, bronchus and lung: Secondary | ICD-10-CM | POA: Diagnosis not present

## 2018-11-22 DIAGNOSIS — Z7289 Other problems related to lifestyle: Secondary | ICD-10-CM | POA: Diagnosis not present

## 2018-11-22 DIAGNOSIS — C50412 Malignant neoplasm of upper-outer quadrant of left female breast: Secondary | ICD-10-CM | POA: Diagnosis not present

## 2018-11-22 NOTE — Research (Signed)
[id number], UNDERSTANDING AND PREDICTING BREAST CANCER EVENTS AFTER TREATMENT (UPBEAT STUDY).  Dr. Lindi Adie referred patient for the Upbeat study. Met with patient alone for approximately 10 minutes to provide information about this study  Briefly explained the purpose of this study and reviewed the study procedures and corresponding time points for completing.Informed patient thatall baseline activities including cardiac MRI, questionnaires, neurocognitive tests and physical functions tests will need to be completed prior to starting on treatment. Informed patient that participation is voluntary.  Patient denies claustrophobia or any implants that would prevent her from having an MRI.  She also states she can walk on a treadmill and also able to walk 2 blocks without any shortness of breath, chest pain or dizziness. Patient gave permission for research nurse to review medical chart to check further eligibility for this study.  Provided patient with a consent form PVD 01/12/18 and hippa form dated 10/03/15 to read at home. Also gave patient a clinical trials informational brochure and my business card. Asked patient ifI can call her in 2 days to follow up on her interest. Patient agreed.Thanked patient for her time todayand willingness to consider this study. I look forward to speaking with her again in a few days.  Foye Spurling, BSN, RN Clinical Research Nurse 11/22/2018 4:45 PM

## 2018-11-22 NOTE — Assessment & Plan Note (Signed)
11/08/2018: Routine screening mammogram detected 2 masses in the left breast, measuring 1.6cm and 1.1cm, spanning a total of 3cm. Biopsy confirmed IDC with DCIS, HER-2 negative by FISH (2+ by IHC), ER/PR negative, Ki67 80%.  Staging: T2N0 stage IIb clinical stage Breast MRI: 11/18/2018: 2.9 cm bilobed mass UOQ left breast  Pathology and radiology counseling: Discussed with the patient, the details of pathology including the type of breast cancer,the clinical staging, the significance of ER, PR and HER-2/neu receptors and the implications for treatment. After reviewing the pathology in detail, we proceeded to discuss the different treatment options between surgery, radiation, chemotherapy, antiestrogen therapies.  Recommendation: 1.  Breast conserving surgery with sentinel lymph node biopsy 2.  Adjuvant chemotherapy.  Given her age we decided to do 4 cycles of Taxotere and Cytoxan 3.  Followed by adjuvant radiation  Chemotherapy Counseling: I discussed the risks and benefits of chemotherapy including the risks of nausea/ vomiting, risk of infection from low WBC count, fatigue due to chemo or anemia, bruising or bleeding due to low platelets, mouth sores, loss/ change in taste and decreased appetite. Liver and kidney function will be monitored through out chemotherapy as abnormalities in liver and kidney function may be a side effect of treatment. Risk of permanent bone marrow dysfunction and leukemia due to chemo were also discussed.  We also discussed the neuropathy risk from Taxotere.  Return to clinic after surgery to discuss the final pathology report and to confirm the adjuvant treatment plan.

## 2018-11-23 ENCOUNTER — Telehealth: Payer: Self-pay | Admitting: *Deleted

## 2018-11-23 ENCOUNTER — Other Ambulatory Visit: Payer: Self-pay | Admitting: Hematology and Oncology

## 2018-11-23 ENCOUNTER — Other Ambulatory Visit: Payer: Self-pay | Admitting: General Surgery

## 2018-11-23 ENCOUNTER — Telehealth: Payer: Self-pay

## 2018-11-23 ENCOUNTER — Other Ambulatory Visit: Payer: Self-pay

## 2018-11-23 DIAGNOSIS — C50412 Malignant neoplasm of upper-outer quadrant of left female breast: Secondary | ICD-10-CM

## 2018-11-23 MED ORDER — PROCHLORPERAZINE MALEATE 10 MG PO TABS: 10.0000 mg | ORAL_TABLET | Freq: Four times a day (QID) | ORAL | 1 refills | Status: DC | PRN

## 2018-11-23 MED ORDER — LORAZEPAM 0.5 MG PO TABS: 0.5000 mg | ORAL_TABLET | Freq: Four times a day (QID) | ORAL | 0 refills | Status: DC | PRN

## 2018-11-23 MED ORDER — ONDANSETRON HCL 8 MG PO TABS: 8.0000 mg | ORAL_TABLET | Freq: Two times a day (BID) | ORAL | 1 refills | Status: DC | PRN

## 2018-11-23 MED ORDER — LIDOCAINE-PRILOCAINE 2.5-2.5 % EX CREA: TOPICAL_CREAM | CUTANEOUS | 3 refills | Status: DC

## 2018-11-23 MED ORDER — DEXAMETHASONE 4 MG PO TABS: ORAL_TABLET | ORAL | 0 refills | Status: DC

## 2018-11-23 NOTE — Progress Notes (Signed)

## 2018-11-23 NOTE — Progress Notes (Signed)
Echocardiogram orders placed.

## 2018-11-23 NOTE — Telephone Encounter (Signed)
Patient called to notify clinic that she would like to proceed with treatment plans here in [place].    Patient has been contacted by [facility] for scheduling port placement.    RN educated patient that we will proceed with setting up education, and treatment scheduling. Pt reports she is debating on cold cap therapy, dignicap, she will inform of [place] decision as soon as possible for scheduling purposes.

## 2018-11-23 NOTE — Telephone Encounter (Signed)
Called pt to assess needs and understanding of dx or treatment care plan. Denies questions or needs at this time. Discussed Dignicap use, timing and cost. Pt will think this over and call back tomorrow with decision. Pt informed she does have some alopecia and would like to discuss this with her dermatologist.  Provided pt with navigation resources and contact information. Discussed next steps. Encourage pt to call with further questions or needs.

## 2018-11-24 ENCOUNTER — Telehealth: Payer: Self-pay | Admitting: *Deleted

## 2018-11-24 ENCOUNTER — Telehealth: Payer: Self-pay | Admitting: Hematology and Oncology

## 2018-11-24 NOTE — Telephone Encounter (Signed)
Patient's call returned. He is presently on pantoprazole 40 mg twice daily.  She was begun on this by Dr. Willey Blade.  She is feeling better.  She has been diagnosed with stage II breast carcinoma and is to start chemotherapy in near future.  She is concerned that she may have nausea.  I told her that she probably would be treated with medications following chemotherapy to prevent nausea. He should consider dropping pantoprazole to once a day after she finishes chemotherapy which will take her about 5 months.

## 2018-11-24 NOTE — Telephone Encounter (Signed)
Scheduled appt per 7/07 sch message - pt aware of appt date and time

## 2018-11-24 NOTE — Telephone Encounter (Signed)
Called patient to follow up on her interest in the Upbeat study.  Patient states she has not had a chance to read the consent form and she asked questions about the timeline for study activities. She is scheduled to start Chemo next week on Wednesday 12/01/18.  Patient is also scheduled for surgery PAC on Tuesday 11/30/18.  She has Echo, pre op testing and Chemo education this Friday 7/10.   Informed patient that to be able to complete all baseline activities prior to chemo she would need to come into clinic tomorrow or next Monday 7/13. She could also come in before her appts this Friday.  It also depends on what the availability is for Cardiac MRI.  Patient states she is feeling a little overwhelmed right now with all the appointments she needs to start treatment next week that she doesn't feel she has the time to come in for additional study visits.  Patient states she has to drive from [place] as well.  She was apologetic and declined to participate in this study due to lack of time.  Thanked patient for taking the time to discuss and think about this study.   Asked patient if it is ok to talk to her when she is in clinic next week about the DCP study.  Explained briefly DCP is a one time collection of data from patients who were screened for NCI trials.  Patient agreed to research nurse speaking with her in clinic as long as it did not require additional clinic visit.  Thanked patient again and will plan to see her in clinic next week to discuss DCP-001 study.   Dr. Lindi Adie notified patient declined the Upbeat study.  Foye Spurling, BSN, RN Clinical Research Nurse 11/24/2018 1:53 PM

## 2018-11-25 ENCOUNTER — Telehealth: Payer: Self-pay | Admitting: Hematology and Oncology

## 2018-11-25 ENCOUNTER — Encounter: Payer: Self-pay | Admitting: Hematology and Oncology

## 2018-11-25 ENCOUNTER — Encounter: Payer: Self-pay | Admitting: *Deleted

## 2018-11-25 NOTE — Telephone Encounter (Signed)
Scheduled appt per 7/09 sch message - pt to get an updated schedule next visit.

## 2018-11-25 NOTE — Pre-Procedure Instructions (Signed)
[facility], [place] [address] Phone: [phone] Fax: [phone]      Your procedure is scheduled on Tuesday from 0730-0830  Report to [facility] Main Entrance "A" at 0530 A.M., and check in at the Admitting office.  Call this number if you have problems the morning of surgery:  [phone]  Call [phone] if you have any questions prior to your surgery date Monday-Friday 8am-4pm    Remember:  Do not eat  after midnight the night before your surgery  You may drink clear liquids until 0430 am  the morning of your surgery.   Clear liquids allowed are: Water, Non-Citrus Juices (without pulp), Carbonated Beverages, Clear Tea, Black Coffee Only, and Gatorade  Please complete your PRE-SURGERY ENSURE that was provided to you by ... the morning of surgery.  Please, if able, drink it in one setting. DO NOT SIP.    Take these medicines the morning of surgery with A SIP OF WATER :              levothyroxine (SYNTHROID)              telmisartan (MICARDIS)              ondansetron (ZOFRAN) as needed              pantoprazole (PROTONIX)              prochlorperazine (COMPAZINE)as needed  As of today, STOP taking any Aspirin (unless otherwise instructed by your surgeon), Aleve, Naproxen, Ibuprofen, Motrin, Advil, Goody's, BC's, all herbal medications, fish oil, and all vitamins.    The Morning of Surgery  Do not wear jewelry, make-up or nail polish.  Do not wear lotions, powders, or perfumes, or deodorant  Do not shave 48 hours prior to surgery.   Do not bring valuables to the hospital.  [facility] is not responsible for any belongings or valuables.  If you are a smoker, DO NOT Smoke 24 hours prior to surgery IF you wear a CPAP at night please bring your mask, tubing, and machine the morning of surgery   Remember that you must have someone to transport you home after your surgery, and remain with you for 24 hours if  you are discharged the same day.   Contacts, glasses, hearing aids, dentures or bridgework may not be worn into surgery.    Leave your suitcase in the car.  After surgery it may be brought to your room.  For patients admitted to the hospital, discharge time will be determined by your treatment team.  Patients discharged the day of surgery will not be allowed to drive home.    Special instructions:   [facility]- Preparing For Surgery  Before surgery, you can play an important role. Because skin is not sterile, your skin needs to be as free of germs as possible. You can reduce the number of germs on your skin by washing with CHG (chlorahexidine gluconate) Soap before surgery.  CHG is an antiseptic cleaner which kills germs and bonds with the skin to continue killing germs even after washing.    Oral Hygiene is also important to reduce your risk of infection.  Remember - BRUSH YOUR TEETH THE MORNING OF SURGERY WITH YOUR REGULAR TOOTHPASTE  Please do not use if you have an allergy to CHG or antibacterial soaps. If your skin becomes reddened/irritated stop using the CHG.  Do not shave (including  legs and underarms) for at least 48 hours prior to first CHG shower. It is OK to shave your face.  Please follow these instructions carefully.   1. Shower the NIGHT BEFORE SURGERY and the MORNING OF SURGERY with CHG Soap.   2. If you chose to wash your hair, wash your hair first as usual with your normal shampoo.  3. After you shampoo, rinse your hair and body thoroughly to remove the shampoo.  4. Use CHG as you would any other liquid soap. You can apply CHG directly to the skin and wash gently with a scrungie or a clean washcloth.   5. Apply the CHG Soap to your body ONLY FROM THE NECK DOWN.  Do not use on open wounds or open sores. Avoid contact with your eyes, ears, mouth and genitals (private parts). Wash Face and genitals (private parts)  with your normal soap.   6. Wash thoroughly, paying  special attention to the area where your surgery will be performed.  7. Thoroughly rinse your body with warm water from the neck down.  8. DO NOT shower/wash with your normal soap after using and rinsing off the CHG Soap.  9. Pat yourself dry with a CLEAN TOWEL.  10. Wear CLEAN PAJAMAS to bed the night before surgery, wear comfortable clothes the morning of surgery  11. Place CLEAN SHEETS on your bed the night of your first shower and DO NOT SLEEP WITH PETS.  Day of Surgery:  Do not apply any deodorants/lotions. Please shower the morning of surgery with the CHG soap  Please wear clean clothes to the hospital/surgery center.   Remember to brush your teeth WITH YOUR REGULAR TOOTHPASTE.   Please read over the  fact sheets that you were given.

## 2018-11-26 ENCOUNTER — Inpatient Hospital Stay: Payer: Medicare Other

## 2018-11-26 ENCOUNTER — Encounter (HOSPITAL_COMMUNITY)
Admission: RE | Admit: 2018-11-26 | Discharge: 2018-11-26 | Disposition: A | Discharge status: Home / Self Care | Payer: Medicare Other | Source: Ambulatory Visit | Attending: Hematology and Oncology | Admitting: Hematology and Oncology

## 2018-11-26 ENCOUNTER — Ambulatory Visit (HOSPITAL_COMMUNITY)
Admission: RE | Admit: 2018-11-26 | Discharge: 2018-11-26 | Disposition: A | Discharge status: Home / Self Care | Payer: Medicare Other | Source: Ambulatory Visit | Attending: Hematology and Oncology | Admitting: Hematology and Oncology

## 2018-11-26 ENCOUNTER — Other Ambulatory Visit (HOSPITAL_COMMUNITY)
Admission: RE | Admit: 2018-11-26 | Discharge: 2018-11-26 | Disposition: A | Discharge status: Home / Self Care | Payer: Medicare Other | Source: Ambulatory Visit | Attending: General Surgery | Admitting: General Surgery

## 2018-11-26 ENCOUNTER — Other Ambulatory Visit: Payer: Self-pay

## 2018-11-26 ENCOUNTER — Encounter (HOSPITAL_COMMUNITY): Payer: Self-pay

## 2018-11-26 ENCOUNTER — Telehealth: Payer: Self-pay | Admitting: *Deleted

## 2018-11-26 DIAGNOSIS — Z171 Estrogen receptor negative status [ER-]: Secondary | ICD-10-CM | POA: Insufficient documentation

## 2018-11-26 DIAGNOSIS — I10 Essential (primary) hypertension: Secondary | ICD-10-CM | POA: Insufficient documentation

## 2018-11-26 DIAGNOSIS — Z1159 Encounter for screening for other viral diseases: Secondary | ICD-10-CM | POA: Insufficient documentation

## 2018-11-26 DIAGNOSIS — E785 Hyperlipidemia, unspecified: Secondary | ICD-10-CM | POA: Diagnosis not present

## 2018-11-26 DIAGNOSIS — C50412 Malignant neoplasm of upper-outer quadrant of left female breast: Secondary | ICD-10-CM | POA: Insufficient documentation

## 2018-11-26 DIAGNOSIS — Z87891 Personal history of nicotine dependence: Secondary | ICD-10-CM | POA: Diagnosis not present

## 2018-11-26 DIAGNOSIS — I080 Rheumatic disorders of both mitral and aortic valves: Secondary | ICD-10-CM | POA: Insufficient documentation

## 2018-11-26 LAB — BASIC METABOLIC PANEL
Anion gap: 10 (ref 5–15)
BUN: 23 mg/dL (ref 8–23)
CO2: 24 mmol/L (ref 22–32)
Calcium: 9 mg/dL (ref 8.9–10.3)
Chloride: 105 mmol/L (ref 98–111)
Creatinine, Ser: 1.22 mg/dL — ABNORMAL HIGH (ref 0.44–1.00)
GFR calc Af Amer: 51 mL/min — ABNORMAL LOW (ref >60)
GFR calc non Af Amer: 44 mL/min — ABNORMAL LOW (ref >60)
Glucose, Bld: 86 mg/dL (ref 70–99)
Potassium: 4.4 mmol/L (ref 3.5–5.1)
Sodium: 139 mmol/L (ref 135–145)

## 2018-11-26 LAB — CBC
HCT: 42.1 % (ref 36.0–46.0)
Hemoglobin: 13.8 g/dL (ref 12.0–15.0)
MCH: 32.5 pg (ref 26.0–34.0)
MCHC: 32.8 g/dL (ref 30.0–36.0)
MCV: 99.1 fL (ref 80.0–100.0)
Platelets: 253 10*3/uL (ref 150–400)
RBC: 4.25 MIL/uL (ref 3.87–5.11)
RDW: 11.9 % (ref 11.5–15.5)
WBC: 6.2 10*3/uL (ref 4.0–10.5)
nRBC: 0 % (ref 0.0–0.2)

## 2018-11-26 LAB — SARS CORONAVIRUS 2 (TAT 6-24 HRS): SARS Coronavirus 2: NEGATIVE

## 2018-11-26 NOTE — Progress Notes (Signed)
  Echocardiogram 2D Echocardiogram has been performed.  Bobbye Charleston 11/26/2018, 1:31 PM

## 2018-11-26 NOTE — Pre-Procedure Instructions (Signed)
[place], [place] [address] Phone: [phone] Fax: [phone]      Your procedure is scheduled on Tuesday from 0730-0830  Report to [facility] Main Entrance "A" at 0530 A.M., and check in at the Admitting office.  Call this number if you have problems the morning of surgery:  [phone]  Call [phone] if you have any questions prior to your surgery date Monday-Friday 8am-4pm    Remember:  Do not eat  after midnight the night before your surgery  You may drink clear liquids until 0430 am  the morning of your surgery.   Clear liquids allowed are: Water, Non-Citrus Juices (without pulp), Carbonated Beverages, Clear Tea, Black Coffee Only, and Gatorade  Please complete your PRE-SURGERY ENSURE that was provided to you by ... the morning of surgery.  Please, if able, drink it in one setting. DO NOT SIP.    Take these medicines the morning of surgery with A SIP OF WATER :              levothyroxine (SYNTHROID)              ondansetron (ZOFRAN) as needed              pantoprazole (PROTONIX)              prochlorperazine (COMPAZINE)as needed  As of today, STOP taking any Aspirin (unless otherwise instructed by your surgeon), Aleve, Naproxen, Ibuprofen, Motrin, Advil, Goody's, BC's, all herbal medications, fish oil, and all vitamins.    The Morning of Surgery  Do not wear jewelry, make-up or nail polish.  Do not wear lotions, powders, or perfumes, or deodorant  Do not shave 48 hours prior to surgery.   Do not bring valuables to the hospital.  [facility] is not responsible for any belongings or valuables.  If you are a smoker, DO NOT Smoke 24 hours prior to surgery IF you wear a CPAP at night please bring your mask, tubing, and machine the morning of surgery   Remember that you must have someone to transport you home after your surgery, and remain with you for 24 hours if you are discharged the same  day.   Contacts, glasses, hearing aids, dentures or bridgework may not be worn into surgery.    Leave your suitcase in the car.  After surgery it may be brought to your room.  For patients admitted to the hospital, discharge time will be determined by your treatment team.  Patients discharged the day of surgery will not be allowed to drive home.    Special instructions:   [facility]- Preparing For Surgery  Before surgery, you can play an important role. Because skin is not sterile, your skin needs to be as free of germs as possible. You can reduce the number of germs on your skin by washing with CHG (chlorahexidine gluconate) Soap before surgery.  CHG is an antiseptic cleaner which kills germs and bonds with the skin to continue killing germs even after washing.    Oral Hygiene is also important to reduce your risk of infection.  Remember - BRUSH YOUR TEETH THE MORNING OF SURGERY WITH YOUR REGULAR TOOTHPASTE  Please do not use if you have an allergy to CHG or antibacterial soaps. If your skin becomes reddened/irritated stop using the CHG.  Do not shave (including legs and underarms) for at least 48 hours prior to first CHG shower. It is  OK to shave your face.  Please follow these instructions carefully.   1. Shower the NIGHT BEFORE SURGERY and the MORNING OF SURGERY with CHG Soap.   2. If you chose to wash your hair, wash your hair first as usual with your normal shampoo.  3. After you shampoo, rinse your hair and body thoroughly to remove the shampoo.  4. Use CHG as you would any other liquid soap. You can apply CHG directly to the skin and wash gently with a scrungie or a clean washcloth.   5. Apply the CHG Soap to your body ONLY FROM THE NECK DOWN.  Do not use on open wounds or open sores. Avoid contact with your eyes, ears, mouth and genitals (private parts). Wash Face and genitals (private parts)  with your normal soap.   6. Wash thoroughly, paying special attention to the  area where your surgery will be performed.  7. Thoroughly rinse your body with warm water from the neck down.  8. DO NOT shower/wash with your normal soap after using and rinsing off the CHG Soap.  9. Pat yourself dry with a CLEAN TOWEL.  10. Wear CLEAN PAJAMAS to bed the night before surgery, wear comfortable clothes the morning of surgery  11. Place CLEAN SHEETS on your bed the night of your first shower and DO NOT SLEEP WITH PETS.  Day of Surgery:  Do not apply any deodorants/lotions. Please shower the morning of surgery with the CHG soap  Please wear clean clothes to the hospital/surgery center.   Remember to brush your teeth WITH YOUR REGULAR TOOTHPASTE.   Please read over the  fact sheets that you were given.

## 2018-11-26 NOTE — Progress Notes (Signed)
PCP - Dr. [name] Cardiologist - denies  Chest x-ray - N/A EKG - 08/10/18 Stress Test - 10+ years ago ECHO - 11/26/18 Cardiac Cath - denies  Sleep Study - denies CPAP - denies  Blood Thinner Instructions:N/A Aspirin Instructions:N/A  Anesthesia review: No  Patient denies shortness of breath, fever, cough and chest pain at PAT appointment   Patient verbalized understanding of instructions that were given to them at the PAT appointment. Patient was also instructed that they will need to review over the PAT instructions again at home before surgery.  Pt went for Covid testing prior to PAT appointment. Aware of quarantining until surgery.    Coronavirus Screening  Have you experienced the following symptoms:  Cough yes/no: No Fever (>100.61F)  yes/no: No Runny nose yes/no: No Sore throat yes/no: No Difficulty breathing/shortness of breath  yes/no: No  Have you or a family member traveled in the last 14 days and where? yes/no: No   If the patient indicates "YES" to the above questions, their PAT will be rescheduled to limit the exposure to others and, the surgeon will be notified. THE PATIENT WILL NEED TO BE ASYMPTOMATIC FOR 14 DAYS.   If the patient is not experiencing any of these symptoms, the PAT nurse will instruct them to NOT bring anyone with them to their appointment since they may have these symptoms or traveled as well.   Please remind your patients and families that hospital visitation restrictions are in effect and the importance of the restrictions.

## 2018-11-27 DIAGNOSIS — Z1379 Encounter for other screening for genetic and chromosomal anomalies: Secondary | ICD-10-CM | POA: Insufficient documentation

## 2018-11-29 ENCOUNTER — Encounter: Payer: Self-pay | Admitting: Genetic Counselor

## 2018-11-29 ENCOUNTER — Ambulatory Visit: Payer: Self-pay | Admitting: Genetic Counselor

## 2018-11-29 ENCOUNTER — Telehealth: Payer: Self-pay | Admitting: Genetic Counselor

## 2018-11-29 DIAGNOSIS — Z1379 Encounter for other screening for genetic and chromosomal anomalies: Secondary | ICD-10-CM

## 2018-11-29 DIAGNOSIS — C50412 Malignant neoplasm of upper-outer quadrant of left female breast: Secondary | ICD-10-CM

## 2018-11-29 NOTE — Anesthesia Preprocedure Evaluation (Addendum)
Anesthesia Evaluation  Patient identified by MRN, date of birth, ID band Patient awake    Reviewed: Allergy & Precautions, H&P , NPO status , Patient's Chart, lab work & pertinent test results  Airway Mallampati: II  TM Distance: >3 FB Neck ROM: Full    Dental no notable dental hx. (+) Teeth Intact, Dental Advisory Given   Pulmonary neg pulmonary ROS, former smoker,    Pulmonary exam normal breath sounds clear to auscultation       Cardiovascular Exercise Tolerance: Good hypertension, Pt. on medications  Rhythm:Regular Rate:Normal     Neuro/Psych negative neurological ROS  negative psych ROS   GI/Hepatic negative GI ROS, Neg liver ROS,   Endo/Other  Hypothyroidism   Renal/GU negative Renal ROS  negative genitourinary   Musculoskeletal   Abdominal   Peds  Hematology negative hematology ROS (+)   Anesthesia Other Findings   Reproductive/Obstetrics negative OB ROS                            Anesthesia Physical Anesthesia Plan  ASA: II  Anesthesia Plan: General   Post-op Pain Management:    Induction: Intravenous  PONV Risk Score and Plan: 4 or greater and Ondansetron, Dexamethasone and Treatment may vary due to age or medical condition  Airway Management Planned: LMA  Additional Equipment:   Intra-op Plan:   Post-operative Plan: Extubation in OR  Informed Consent: I have reviewed the patients History and Physical, chart, labs and discussed the procedure including the risks, benefits and alternatives for the proposed anesthesia with the patient or authorized representative who has indicated his/her understanding and acceptance.     Dental advisory given  Plan Discussed with: CRNA  Anesthesia Plan Comments:         Anesthesia Quick Evaluation

## 2018-11-29 NOTE — Progress Notes (Signed)
HPI:  Carol Byrd was previously seen in the [place] clinic due to a personal and family history of breast cancer and concerns regarding a hereditary predisposition to cancer. Please refer to our prior cancer genetics clinic note for more information regarding our discussion, assessment and recommendations, at the time. Carol Byrd recent genetic test results were disclosed to her, as were recommendations warranted by these results. These results and recommendations are discussed in more detail below.  CANCER HISTORY:  Oncology History  Malignant neoplasm of upper-outer quadrant of left breast in female, estrogen receptor negative ([place])  11/08/2018 Initial Diagnosis   Routine screening mammogram detected 2 masses in the left breast, measuring 1.6cm and 1.1cm, spanning a total of 3cm. Biopsy confirmed IDC with DCIS, HER-2 negative by FISH (2+ by IHC), ER/PR negative, Ki67 80%.    11/18/2018 Breast MRI   2.9 centimeter bilobed mass in the UOQ of the left breast, consistent with known malignancy. 0.5 centimeter satellite nodule is 0.5 centimeters below this mass.   11/22/2018 Cancer Staging   Staging form: Breast, AJCC 8th Edition - Clinical stage from 11/22/2018: Stage IIB (cT2, cN0, cM0, G3, ER-, PR-, HER2-) - Signed by Nicholas Lose, MD on 11/22/2018   11/27/2018 Genetic Testing   Patient had genetic testing done for a personal and family history of breast cancer.  Results were negative on the [facility] Breast cancer STAT gene panel. The STAT Breast cancer panel offered by [facility] includes sequencing and rearrangement analysis for the following 7 genes:  BRCA1, BRCA2, CDH1, PALB2, PTEN, STK11 and TP53.  The report date is 11/27/2018.   12/01/2018 -  Chemotherapy   The patient had DOXOrubicin (ADRIAMYCIN) chemo injection 114 mg, 60 mg/m2 = 114 mg, Intravenous,  Once, 0 of 4 cycles palonosetron (ALOXI) injection 0.25 mg, 0.25 mg, Intravenous,  Once, 0 of 8 cycles pegfilgrastim-jmdb  (FULPHILA) injection 6 mg, 6 mg, Subcutaneous,  Once, 0 of 4 cycles CARBOplatin (PARAPLATIN) 480 mg in sodium chloride 0.9 % 250 mL chemo infusion, 480 mg (100 % of original dose 478.8 mg), Intravenous,  Once, 0 of 4 cycles Dose modification:   (original dose 478.8 mg, Cycle 5) cyclophosphamide (CYTOXAN) 1,140 mg in sodium chloride 0.9 % 250 mL chemo infusion, 600 mg/m2 = 1,140 mg, Intravenous,  Once, 0 of 4 cycles PACLitaxel (TAXOL) 150 mg in sodium chloride 0.9 % 250 mL chemo infusion (</= 26m/m2), 80 mg/m2 = 150 mg, Intravenous,  Once, 0 of 4 cycles fosaprepitant (EMEND) 150 mg, dexamethasone (DECADRON) 12 mg in sodium chloride 0.9 % 145 mL IVPB, , Intravenous,  Once, 0 of 8 cycles  for chemotherapy treatment.      FAMILY HISTORY:  We obtained a detailed, 4-generation family history.  Significant diagnoses are listed below: Family History  Problem Relation Age of Onset  . Depression Mother   . Hypertension Mother   . Heart attack Father   . Heart disease Other   . Multiple myeloma Cousin 643      maternal first cousin  . Breast cancer Sister 483      second breast cancer diagnosed at 726 . Breast cancer Maternal Aunt 80  . Cancer Maternal Uncle        unknown  . Stomach cancer Maternal Grandfather 80  . Lung cancer Maternal Uncle 619 . Lung cancer Maternal Uncle 632 . Lung cancer Cousin        maternal first cousins  . Cancer Cousin  unknown, paternal first cousins  . Colon cancer Neg Hx      Carol Byrd has a sister who was first diagnosed with breast cancer at age 43, then diagnosed with breast cancer in the other breast at age 52. This sister was also diagnosed with melanoma at age 46. Carol Byrd sister had genetic testing, possibly after her first diagnosis of breast cancer. The results were reportedly negative, although they were not available for review.  Carol Byrd mother had a hysterectomy after a "suspicious" pap smear. She has two maternal aunts,  one had breast cancer diagnosed in her 23s and the other maternal aunt did not have cancer. Carol Byrd has 8 maternal uncles. She suspects that all or most of her maternal uncles may have had cancer. She specified that two of her maternal uncles may have had lung cancer, although she is largely unsure what type of cancer they had. Carol Byrd has multiple maternal first cousins with cancer, including multiple myeloma in one cousin and lung cancer in two others.  Carol Byrd has two paternal first cousins may have been diagnosed with cancer, but she is unsure of what type.  Carol Byrd maternal ancestors are of European descent, and paternal ancestors are of European descent. There is no reported Ashkenazi Jewish ancestry. There is no known consanguinity.  GENETIC TEST RESULTS: Genetic testing reported out on 11/27/2018 through the [facility] Breast cancer STAT gene panel found no pathogenic mutations. The STAT Breast cancer panel offered by [facility] includes sequencing and rearrangement analysis for the following 7 genes:  BRCA1, BRCA2, CDH1, PALB2, PTEN, STK11 and TP53.  The test report has been scanned into EPIC and is located under the Molecular Pathology section of the Results Review tab.  A portion of the result report is included below for reference.     We discussed with Carol Byrd that because current genetic testing is not perfect, it is possible there may be a gene mutation in one of these genes that current testing cannot detect, but that chance is small.  We also discussed, that there could be another gene that has not yet been discovered, or that we have not yet tested, that is responsible for the cancer diagnoses in the family. It is also possible there is a hereditary cause for the cancer in the family that Carol Byrd did not inherit and therefore was not identified in her testing.  Therefore, it is important to remain in touch with cancer genetics in the future so that we can  continue to offer Carol Byrd the most up to date genetic testing.   ADDITIONAL GENETIC TESTING: We discussed with Carol Byrd that there are other genes that are associated with increased cancer risk that can be analyzed. Should Carol Byrd wish to pursue additional genetic testing, we are happy to discuss and coordinate this testing, at any time.    CANCER SCREENING RECOMMENDATIONS: Carol Byrd test result is considered negative (normal).  This means that we have not identified a hereditary cause for her personal and family history of cancer at this time. Most cancers happen by chance and this negative test suggests that her cancer may fall into this category.    While reassuring, this does not definitively rule out a hereditary predisposition to cancer. It is still possible that there could be genetic mutations that are undetectable by current technology. There could be genetic mutations in genes that have not been tested or identified to increase cancer risk.  Therefore, it is  recommended she continue to follow the cancer management and screening guidelines provided by her oncology and primary healthcare provider.   An individual's cancer risk and medical management are not determined by genetic test results alone. Overall cancer risk assessment incorporates additional factors, including personal medical history, family history, and any available genetic information that may result in a personalized plan for cancer prevention and surveillance  An individual's cancer risk and medical management are not determined by genetic test results alone. Overall cancer risk assessment incorporates additional factors, including personal medical history, family history, and any available genetic information that may result in a personalized plan for cancer prevention and surveillance.  RECOMMENDATIONS FOR FAMILY MEMBERS:  Individuals in this family might be at some increased risk of developing cancer, over  the general population risk, simply due to the family history of cancer.  We recommended women in this family have a yearly mammogram beginning at age 42, or 76 years younger than the earliest onset of cancer, an annual clinical breast exam, and perform monthly breast self-exams. Women in this family should also have a gynecological exam as recommended by their primary provider. All family members should have a colonoscopy by age 50.  FOLLOW-UP: Lastly, we discussed with Carol Byrd that cancer genetics is a rapidly advancing field and it is possible that new genetic tests will be appropriate for her and/or her family members in the future. We encouraged her to remain in contact with cancer genetics on an annual basis so we can update her personal and family histories and let her know of advances in cancer genetics that may benefit this family.   Our contact number was provided. Ms. Fregeau questions were answered to her satisfaction, and she knows she is welcome to call us at anytime with additional questions or concerns.   Clint Guy, MS Genetic Counselor [email or web address] Phone: [phone]

## 2018-11-29 NOTE — Telephone Encounter (Signed)
Revealed negative genetic testing.  Discussed that we do not know why she has breast cancer or why there is cancer in the family. It could be due to a different gene that we are not testing, or maybe our current technology may not be able to pick something up.

## 2018-11-30 ENCOUNTER — Ambulatory Visit (HOSPITAL_COMMUNITY): Payer: Medicare Other | Admitting: Certified Registered Nurse Anesthetist

## 2018-11-30 ENCOUNTER — Encounter (HOSPITAL_COMMUNITY)
Admission: RE | Disposition: A | Discharge status: Home / Self Care | Payer: Self-pay | Source: Home / Self Care | Attending: General Surgery

## 2018-11-30 ENCOUNTER — Ambulatory Visit (HOSPITAL_COMMUNITY): Payer: Medicare Other

## 2018-11-30 ENCOUNTER — Ambulatory Visit (HOSPITAL_COMMUNITY)
Admission: RE | Admit: 2018-11-30 | Discharge: 2018-11-30 | Disposition: A | Discharge status: Home / Self Care | Payer: Medicare Other | Attending: General Surgery | Admitting: General Surgery

## 2018-11-30 DIAGNOSIS — Z79890 Hormone replacement therapy (postmenopausal): Secondary | ICD-10-CM | POA: Insufficient documentation

## 2018-11-30 DIAGNOSIS — E039 Hypothyroidism, unspecified: Secondary | ICD-10-CM | POA: Diagnosis not present

## 2018-11-30 DIAGNOSIS — Z95828 Presence of other vascular implants and grafts: Secondary | ICD-10-CM

## 2018-11-30 DIAGNOSIS — I10 Essential (primary) hypertension: Secondary | ICD-10-CM | POA: Insufficient documentation

## 2018-11-30 DIAGNOSIS — Z171 Estrogen receptor negative status [ER-]: Secondary | ICD-10-CM | POA: Insufficient documentation

## 2018-11-30 DIAGNOSIS — C50412 Malignant neoplasm of upper-outer quadrant of left female breast: Secondary | ICD-10-CM | POA: Insufficient documentation

## 2018-11-30 DIAGNOSIS — Z79899 Other long term (current) drug therapy: Secondary | ICD-10-CM | POA: Diagnosis not present

## 2018-11-30 DIAGNOSIS — I4891 Unspecified atrial fibrillation: Secondary | ICD-10-CM | POA: Diagnosis not present

## 2018-11-30 DIAGNOSIS — E7800 Pure hypercholesterolemia, unspecified: Secondary | ICD-10-CM | POA: Insufficient documentation

## 2018-11-30 DIAGNOSIS — Z419 Encounter for procedure for purposes other than remedying health state, unspecified: Secondary | ICD-10-CM

## 2018-11-30 DIAGNOSIS — Z452 Encounter for adjustment and management of vascular access device: Secondary | ICD-10-CM | POA: Diagnosis not present

## 2018-11-30 DIAGNOSIS — Z803 Family history of malignant neoplasm of breast: Secondary | ICD-10-CM | POA: Insufficient documentation

## 2018-11-30 HISTORY — PX: PORTACATH PLACEMENT: SHX2246

## 2018-11-30 SURGERY — INSERTION, TUNNELED CENTRAL VENOUS DEVICE, WITH PORT
Anesthesia: General | Site: Chest | Laterality: Right

## 2018-11-30 MED ORDER — BUPIVACAINE HCL (PF) 0.25 % IJ SOLN
INTRAMUSCULAR | Status: AC
  Filled 2018-11-30: qty 30

## 2018-11-30 MED ORDER — ONDANSETRON HCL 4 MG/2ML IJ SOLN
INTRAMUSCULAR | Status: AC
  Filled 2018-11-30: qty 2

## 2018-11-30 MED ORDER — DEXAMETHASONE SODIUM PHOSPHATE 10 MG/ML IJ SOLN
INTRAMUSCULAR | Status: DC | PRN
  Administered 2018-11-30: 4 mg via INTRAVENOUS

## 2018-11-30 MED ORDER — ONDANSETRON HCL 4 MG/2ML IJ SOLN
INTRAMUSCULAR | Status: DC | PRN
  Administered 2018-11-30: 4 mg via INTRAVENOUS

## 2018-11-30 MED ORDER — ACETAMINOPHEN 650 MG RE SUPP: 650.0000 mg | RECTAL | Status: DC | PRN

## 2018-11-30 MED ORDER — GABAPENTIN 100 MG PO CAPS
100.0000 mg | ORAL_CAPSULE | ORAL | Status: AC
  Administered 2018-11-30: 100 mg via ORAL

## 2018-11-30 MED ORDER — MORPHINE SULFATE (PF) 2 MG/ML IV SOLN: 1.0000 mg | INTRAVENOUS | Status: DC | PRN

## 2018-11-30 MED ORDER — SODIUM CHLORIDE 0.9% FLUSH: 3.0000 mL | INTRAVENOUS | Status: DC | PRN

## 2018-11-30 MED ORDER — CEFAZOLIN SODIUM-DEXTROSE 2-4 GM/100ML-% IV SOLN
2.0000 g | INTRAVENOUS | Status: AC
  Administered 2018-11-30: 2 g via INTRAVENOUS

## 2018-11-30 MED ORDER — ACETAMINOPHEN 325 MG PO TABS: 650.0000 mg | ORAL_TABLET | ORAL | Status: DC | PRN

## 2018-11-30 MED ORDER — SODIUM CHLORIDE 0.9% FLUSH: 3.0000 mL | Freq: Two times a day (BID) | INTRAVENOUS | Status: DC

## 2018-11-30 MED ORDER — TRAMADOL HCL 50 MG PO TABS: 100.0000 mg | ORAL_TABLET | Freq: Four times a day (QID) | ORAL | 0 refills | Status: DC | PRN

## 2018-11-30 MED ORDER — PROPOFOL 10 MG/ML IV BOLUS
INTRAVENOUS | Status: DC | PRN
  Administered 2018-11-30: 100 mg via INTRAVENOUS
  Administered 2018-11-30: 20 mg via INTRAVENOUS
  Administered 2018-11-30: 50 mg via INTRAVENOUS

## 2018-11-30 MED ORDER — HEPARIN SOD (PORK) LOCK FLUSH 100 UNIT/ML IV SOLN
INTRAVENOUS | Status: DC | PRN
  Administered 2018-11-30: 500 [IU] via INTRAVENOUS

## 2018-11-30 MED ORDER — ACETAMINOPHEN 500 MG PO TABS
1000.0000 mg | ORAL_TABLET | ORAL | Status: AC
  Administered 2018-11-30: 06:00:00 1000 mg via ORAL

## 2018-11-30 MED ORDER — PROPOFOL 10 MG/ML IV BOLUS
INTRAVENOUS | Status: AC
  Filled 2018-11-30: qty 20

## 2018-11-30 MED ORDER — ENSURE PRE-SURGERY PO LIQD
296.0000 mL | Freq: Once | ORAL | Status: DC
  Filled 2018-11-30: qty 296

## 2018-11-30 MED ORDER — SODIUM CHLORIDE 0.9 % IV SOLN
INTRAVENOUS | Status: AC
  Filled 2018-11-30: qty 1.2

## 2018-11-30 MED ORDER — ACETAMINOPHEN 500 MG PO TABS: 1000.0000 mg | ORAL_TABLET | Freq: Once | ORAL | Status: DC

## 2018-11-30 MED ORDER — LIDOCAINE 2% (20 MG/ML) 5 ML SYRINGE
INTRAMUSCULAR | Status: AC
  Filled 2018-11-30: qty 5

## 2018-11-30 MED ORDER — LIDOCAINE 2% (20 MG/ML) 5 ML SYRINGE
INTRAMUSCULAR | Status: DC | PRN
  Administered 2018-11-30: 60 mg via INTRAVENOUS

## 2018-11-30 MED ORDER — OXYCODONE HCL 5 MG PO TABS: 5.0000 mg | ORAL_TABLET | ORAL | Status: DC | PRN

## 2018-11-30 MED ORDER — FENTANYL CITRATE (PF) 100 MCG/2ML IJ SOLN: 25.0000 ug | INTRAMUSCULAR | Status: DC | PRN

## 2018-11-30 MED ORDER — BUPIVACAINE-EPINEPHRINE (PF) 0.25% -1:200000 IJ SOLN
INTRAMUSCULAR | Status: AC
  Filled 2018-11-30: qty 30

## 2018-11-30 MED ORDER — FENTANYL CITRATE (PF) 250 MCG/5ML IJ SOLN
INTRAMUSCULAR | Status: AC
  Filled 2018-11-30: qty 5

## 2018-11-30 MED ORDER — GABAPENTIN 100 MG PO CAPS
ORAL_CAPSULE | ORAL | Status: AC
  Filled 2018-11-30: qty 1

## 2018-11-30 MED ORDER — BUPIVACAINE HCL 0.25 % IJ SOLN
INTRAMUSCULAR | Status: DC | PRN
  Administered 2018-11-30: 10 mL

## 2018-11-30 MED ORDER — SODIUM CHLORIDE 0.9 % IV SOLN: INTRAVENOUS | Status: DC

## 2018-11-30 MED ORDER — HEPARIN SOD (PORK) LOCK FLUSH 100 UNIT/ML IV SOLN
INTRAVENOUS | Status: AC
  Filled 2018-11-30: qty 5

## 2018-11-30 MED ORDER — SODIUM CHLORIDE 0.9 % IV SOLN
INTRAVENOUS | Status: DC | PRN
  Administered 2018-11-30: 500 mL

## 2018-11-30 MED ORDER — ACETAMINOPHEN 500 MG PO TABS
ORAL_TABLET | ORAL | Status: AC
  Filled 2018-11-30: qty 2

## 2018-11-30 MED ORDER — CEFAZOLIN SODIUM-DEXTROSE 2-4 GM/100ML-% IV SOLN
INTRAVENOUS | Status: AC
  Filled 2018-11-30: qty 100

## 2018-11-30 MED ORDER — SODIUM CHLORIDE 0.9 % IV SOLN
INTRAVENOUS | Status: DC | PRN
  Administered 2018-11-30: 08:00:00 20 ug/min via INTRAVENOUS

## 2018-11-30 MED ORDER — SODIUM CHLORIDE 0.9 % IV SOLN: 250.0000 mL | INTRAVENOUS | Status: DC | PRN

## 2018-11-30 MED ORDER — LACTATED RINGERS IV SOLN
INTRAVENOUS | Status: DC | PRN
  Administered 2018-11-30: 07:00:00 via INTRAVENOUS

## 2018-11-30 MED ORDER — DEXAMETHASONE SODIUM PHOSPHATE 10 MG/ML IJ SOLN
INTRAMUSCULAR | Status: AC
  Filled 2018-11-30: qty 1

## 2018-11-30 SURGICAL SUPPLY — 44 items
ADH SKN CLS APL DERMABOND .7 (GAUZE/BANDAGES/DRESSINGS) ×1
BAG DECANTER FOR FLEXI CONT (MISCELLANEOUS) ×3 IMPLANT
CHLORAPREP W/TINT 10.5 ML (MISCELLANEOUS) ×3 IMPLANT
COVER SURGICAL LIGHT HANDLE (MISCELLANEOUS) ×3 IMPLANT
COVER TRANSDUCER ULTRASND GEL (DRAPE) ×3 IMPLANT
COVER WAND RF STERILE (DRAPES) ×3 IMPLANT
DECANTER SPIKE VIAL GLASS SM (MISCELLANEOUS) ×3 IMPLANT
DERMABOND ADVANCED (GAUZE/BANDAGES/DRESSINGS) ×2
DERMABOND ADVANCED .7 DNX12 (GAUZE/BANDAGES/DRESSINGS) ×1 IMPLANT
DRAPE C-ARM 42X72 X-RAY (DRAPES) ×3 IMPLANT
DRAPE CHEST BREAST 15X10 FENES (DRAPES) ×3 IMPLANT
DRSG TEGADERM 4X4.75 (GAUZE/BANDAGES/DRESSINGS) ×4 IMPLANT
ELECT CAUTERY BLADE 6.4 (BLADE) ×3 IMPLANT
ELECT REM PT RETURN 9FT ADLT (ELECTROSURGICAL) ×3
ELECTRODE REM PT RTRN 9FT ADLT (ELECTROSURGICAL) ×1 IMPLANT
GAUZE 4X4 16PLY RFD (DISPOSABLE) ×3 IMPLANT
GAUZE SPONGE 4X4 12PLY STRL (GAUZE/BANDAGES/DRESSINGS) ×2 IMPLANT
GEL ULTRASOUND 20GR AQUASONIC (MISCELLANEOUS) ×3 IMPLANT
GLOVE BIO SURGEON STRL SZ7 (GLOVE) ×3 IMPLANT
GLOVE BIOGEL PI IND STRL 7.5 (GLOVE) ×1 IMPLANT
GLOVE BIOGEL PI INDICATOR 7.5 (GLOVE) ×2
GOWN STRL REUS W/ TWL LRG LVL3 (GOWN DISPOSABLE) ×2 IMPLANT
GOWN STRL REUS W/TWL LRG LVL3 (GOWN DISPOSABLE) ×6
INTRODUCER COOK 11FR (CATHETERS) IMPLANT
KIT BASIN OR (CUSTOM PROCEDURE TRAY) ×3 IMPLANT
KIT PORT POWER 8FR ISP CVUE (Port) ×3 IMPLANT
KIT TURNOVER KIT B (KITS) ×3 IMPLANT
NS IRRIG 1000ML POUR BTL (IV SOLUTION) ×3 IMPLANT
PAD ARMBOARD 7.5X6 YLW CONV (MISCELLANEOUS) ×6 IMPLANT
PENCIL BUTTON HOLSTER BLD 10FT (ELECTRODE) ×3 IMPLANT
POSITIONER HEAD DONUT 9IN (MISCELLANEOUS) ×3 IMPLANT
SET INTRODUCER 12FR PACEMAKER (INTRODUCER) IMPLANT
SET SHEATH INTRODUCER 10FR (MISCELLANEOUS) IMPLANT
SHEATH COOK PEEL AWAY SET 9F (SHEATH) IMPLANT
SUT MNCRL AB 4-0 PS2 18 (SUTURE) ×3 IMPLANT
SUT PROLENE 2 0 SH DA (SUTURE) ×3 IMPLANT
SUT SILK 2 0 (SUTURE)
SUT SILK 2-0 18XBRD TIE 12 (SUTURE) IMPLANT
SUT VIC AB 3-0 SH 27 (SUTURE) ×3
SUT VIC AB 3-0 SH 27XBRD (SUTURE) ×1 IMPLANT
SYR 5ML LUER SLIP (SYRINGE) ×3 IMPLANT
TOWEL GREEN STERILE (TOWEL DISPOSABLE) ×3 IMPLANT
TOWEL GREEN STERILE FF (TOWEL DISPOSABLE) ×3 IMPLANT
TRAY LAPAROSCOPIC MC (CUSTOM PROCEDURE TRAY) ×3 IMPLANT

## 2018-11-30 NOTE — Transfer of Care (Signed)
Immediate Anesthesia Transfer of Care Note  Patient: Carol Byrd  Procedure(s) Performed: INSERTION PORT-A-CATH WITH ULTRASOUND (Right Chest)  Patient Location: PACU  Anesthesia Type:General  Level of Consciousness: awake  Airway & Oxygen Therapy: Patient Spontanous Breathing  Post-op Assessment: Report given to RN and Post -op Vital signs reviewed and stable  Post vital signs: Reviewed and stable  Last Vitals:  Vitals Value Taken Time  BP 125/64 11/30/18 0832  Temp    Pulse 58 11/30/18 0834  Resp 14 11/30/18 0834  SpO2 98 % 11/30/18 0834  Vitals shown include unvalidated device data.  Last Pain:  Vitals:   11/30/18 0607  TempSrc: Oral         Complications: No apparent anesthesia complications

## 2018-11-30 NOTE — Interval H&P Note (Signed)
History and Physical Interval Note:  11/30/2018 7:09 AM  Carol Byrd  has presented today for surgery, with the diagnosis of breast cancer.  The various methods of treatment have been discussed with the patient and family. After consideration of risks, benefits and other options for treatment, the patient has consented to  Procedure(s): INSERTION PORT-A-CATH WITH ULTRASOUND (N/A) as a surgical intervention.  The patient's history has been reviewed, patient examined, no change in status, stable for surgery.  I have reviewed the patient's chart and labs.  Questions were answered to the patient's satisfaction.     Rolm Bookbinder

## 2018-11-30 NOTE — H&P (Signed)
73 yo healthy female referred by Dr Willey Blade for new left breast cancer. Carol Byrd has no personal history of breast disease. Carol Byrd has fh in her sister who was first diagnosed at age 83 and then had another bout recently. Carol Byrd states her genetic testing was negative (not sure exactly what type). Carol Byrd has another maternal aunt with breast cancer. Carol Byrd has no mass or dc. Carol Byrd underwent screening mm that shows c density breasts. there was left breast mass in uoq on screening. dx views showed 2 adjacent masses measuring 2.9 cm total. US shows 2 masses that are next to each other measuring 1.6cm and 1.1 cm with 3 cm total area. the axilla is negative. her biopsy shows a grade III IDC that is er/pr negative, her 2 equivocal with fish pending and Ki is 80%.   Past Surgical History Illene Regulus, CMA; 11/12/2018 9:32 AM) Breast Biopsy  Left. Spinal Surgery - Lower Back  Thyroid Surgery   Diagnostic Studies History Illene Regulus, CMA; 11/12/2018 9:32 AM) Colonoscopy  1-5 years ago Mammogram  within last year Pap Smear  >5 years ago  Allergies Illene Regulus, CMA; 11/12/2018 8:11 AM) No Known Drug Allergies  [11/12/2018]:  Medication History (Alisha Spillers, CMA; 11/12/2018 8:13 AM) Atorvastatin Calcium (20MG Tablet, Oral) Active. Ezetimibe (10MG Tablet, Oral) Active. Finasteride (5MG Tablet, Oral) Active. Levothyroxine Sodium (88MCG Capsule, Oral) Active. Telmisartan (40MG Tablet, Oral) Active. Citrucel (500MG Tablet, Oral) Active. Medications Reconciled  Social History Illene Regulus, CMA; 11/12/2018 9:32 AM) Alcohol use  Moderate alcohol use. Caffeine use  Coffee. No drug use  Tobacco use  Former smoker.  Family History Illene Regulus, Numa; 11/12/2018 9:32 AM) Breast Cancer  Sister. Cervical Cancer  Mother. Heart disease in female family member before age 45  Hypertension  Father, Mother, Sister. Melanoma  Sister.  Pregnancy / Birth History Illene Regulus, CMA;  11/12/2018 9:32 AM) Age at menarche  88 years. Age of menopause  <44 Gravida  0 Para  0  Other Problems Illene Regulus, CMA; 11/12/2018 9:32 AM) Atrial Fibrillation  Back Pain  Bladder Problems  Gastroesophageal Reflux Disease  High blood pressure  Hypercholesterolemia  Lump In Breast  Thyroid Disease     Review of Systems (Alisha Spillers CMA; 11/12/2018 9:32 AM) General Not Present- Appetite Loss, Chills, Fatigue, Fever, Night Sweats, Weight Gain and Weight Loss. Skin Not Present- Change in Wart/Mole, Dryness, Hives, Jaundice, New Lesions, Non-Healing Wounds, Rash and Ulcer. HEENT Present- Wears glasses/contact lenses. Not Present- Earache, Hearing Loss, Hoarseness, Nose Bleed, Oral Ulcers, Ringing in the Ears, Seasonal Allergies, Sinus Pain, Sore Throat, Visual Disturbances and Yellow Eyes. Respiratory Not Present- Bloody sputum, Chronic Cough, Difficulty Breathing, Snoring and Wheezing. Breast Present- Breast Mass. Not Present- Breast Pain, Nipple Discharge and Skin Changes. Cardiovascular Present- Leg Cramps. Not Present- Chest Pain, Difficulty Breathing Lying Down, Palpitations, Rapid Heart Rate, Shortness of Breath and Swelling of Extremities. Gastrointestinal Not Present- Abdominal Pain, Bloating, Bloody Stool, Change in Bowel Habits, Chronic diarrhea, Constipation, Difficulty Swallowing, Excessive gas, Gets full quickly at meals, Hemorrhoids, Indigestion, Nausea, Rectal Pain and Vomiting. Female Genitourinary Present- Urgency. Not Present- Frequency, Nocturia, Painful Urination and Pelvic Pain. Musculoskeletal Not Present- Back Pain, Joint Pain, Joint Stiffness, Muscle Pain, Muscle Weakness and Swelling of Extremities. Neurological Not Present- Decreased Memory, Fainting, Headaches, Numbness, Seizures, Tingling, Tremor, Trouble walking and Weakness. Psychiatric Not Present- Anxiety, Bipolar, Change in Sleep Pattern, Depression, Fearful and Frequent crying. Endocrine  Not Present- Cold Intolerance, Excessive Hunger, Hair Changes, Heat Intolerance, Hot flashes and New  Diabetes. Hematology Not Present- Blood Thinners, Easy Bruising, Excessive bleeding, Gland problems, HIV and Persistent Infections.  Vitals (Alisha Spillers CMA; 11/12/2018 8:11 AM) 11/12/2018 8:10 AM Weight: 168 lb Height: 67in Body Surface Area: 1.88 m Body Mass Index: 26.31 kg/m  Pulse: 113 (Regular)  BP: 138/72(Sitting, Left Arm, Standard)       Physical Exam Rolm Bookbinder MD; 11/12/2018 8:26 AM) General Mental Status-Alert. Orientation-Oriented X3.  Head and Neck Trachea-midline. Thyroid -Note: surgically absent healed scar.   Eye Sclera/Conjunctiva - Bilateral-No scleral icterus.  Chest and Lung Exam Chest and lung exam reveals -quiet, even and easy respiratory effort with no use of accessory muscles.  Breast Nipples-No Discharge. Breast Lump-No Palpable Breast Mass. Note: hematoma left uoq but no discrete mass   Cardiovascular Cardiovascular examination reveals -no digital clubbing, cyanosis, edema, increased warmth or tenderness.  Abdomen Note: soft nontender nondistended no hm   Neurologic Neurologic evaluation reveals -alert and oriented x 3 with no impairment of recent or remote memory.  Lymphatic Head & Neck  General Head & Neck Lymphatics: Bilateral - Description - Normal. Axillary  General Axillary Region: Bilateral - Description - Normal. Note: no  adenopathy     Assessment & Plan (Alisha Spillers CMA; 11/12/2018 9:34 AM)  BREAST CANCER OF UPPER-OUTER QUADRANT OF LEFT FEMALE BREAST (C50.412) Port placement for primary chemotherapy we discussed for an hour staging and pathophysiology of breast cancer and all available treatment options. Carol Byrd will either have a tn or her2 pos tumor with total extent on mm/us of 3 cm. I think mri due to breast density and how mm/us look is reasonable to ensure this is not  larger than what it appears reasonable. we discussed up to 30% chance Carol Byrd would need another biopsy with mri as the downside. I think eventually primary chemotherapy +/- anti her 2 therapy might be preferable. we discussed port placement and indications being size of tumor, downstaging for easier lumpectomy as well as obtaining prognostic info with RCB. we discussed surgery for breast including lumpectomy vs mastectomy. Carol Byrd understands these are equivalent in terms of local recurrence and survival if technically lumpectomy is option. Carol Byrd knows would need radiotherapy with lumpectomy as well. we also discussed sn biopsy at time of surgery to ensure nodes are negative.

## 2018-11-30 NOTE — Discharge Instructions (Signed)
    PORT-A-CATH: POST OP INSTRUCTIONS  Always review your discharge instruction sheet given to you by the facility where your surgery was performed.   1. A prescription for pain medication may be given to you upon discharge. Take your pain medication as prescribed, if needed. If narcotic pain medicine is not needed, then you make take acetaminophen (Tylenol) or ibuprofen (Advil) as needed.  2. Take your usually prescribed medications unless otherwise directed. 3. If you need a refill on your pain medication, please contact our office. All narcotic pain medicine now requires a paper prescription.  Phoned in and fax refills are no longer allowed by law.  Prescriptions will not be filled after 5 pm or on weekends.  4. You should follow a light diet for the remainder of the day after your procedure. 5. Most patients will experience some mild swelling and/or bruising in the area of the incision. It may take several days to resolve. 6. It is common to experience some constipation if taking pain medication after surgery. Increasing fluid intake and taking a stool softener (such as Colace) will usually help or prevent this problem from occurring. A mild laxative (Milk of Magnesia or Miralax) should be taken according to package directions if there are no bowel movements after 48 hours.  7. Unless discharge instructions indicate otherwise, you may remove your bandages 48 hours after surgery, and you may shower at that time. You may have steri-strips (small white skin tapes) in place directly over the incision.  These strips should be left on the skin for 7-10 days.  If your surgeon used Dermabond (skin glue) on the incision, you may shower in 24 hours.  The glue will flake off over the next 2-3 weeks.  8. If your port is left accessed at the end of surgery (needle left in port), the dressing cannot get wet and should only by changed by a healthcare professional. When the port is no longer accessed (when the  needle has been removed), follow step 7.   9. ACTIVITIES:  Limit activity involving your arms for the next 72 hours. Do no strenuous exercise or activity for 1 week. You may drive when you are no longer taking prescription pain medication, you can comfortably wear a seatbelt, and you can maneuver your car. 10.You may need to see your doctor in the office for a follow-up appointment.  Please       check with your doctor.  11.When you receive a new Port-a-Cath, you will get a product guide and        ID card.  Please keep them in case you need them.  WHEN TO CALL YOUR DOCTOR ([phone]): 1. Fever over 101.0 2. Chills 3. Continued bleeding from incision 4. Increased redness and tenderness at the site 5. Shortness of breath, difficulty breathing   The clinic staff is available to answer your questions during regular business hours. Please don't hesitate to call and ask to speak to one of the nurses or medical assistants for clinical concerns. If you have a medical emergency, go to the nearest emergency room or call 911.  A surgeon from [facility] is always on call at the hospital.     For further information, please visit [email or web address]      

## 2018-11-30 NOTE — Progress Notes (Signed)
Patient Care Team: [name], MD as PCP - General (Internal Medicine) Satira Sark, MD as PCP - Cardiology (Cardiology)  DIAGNOSIS:    ICD-10-CM   1. Malignant neoplasm of upper-outer quadrant of left breast in female, estrogen receptor negative ([place])  C50.412    Z17.1     SUMMARY OF ONCOLOGIC HISTORY: Oncology History  Malignant neoplasm of upper-outer quadrant of left breast in female, estrogen receptor negative ([place])  11/08/2018 Initial Diagnosis   Routine screening mammogram detected 2 masses in the left breast, measuring 1.6cm and 1.1cm, spanning a total of 3cm. Biopsy confirmed IDC with DCIS, HER-2 negative by FISH (2+ by IHC), ER/PR negative, Ki67 80%.    11/18/2018 Breast MRI   2.9 centimeter bilobed mass in the UOQ of the left breast, consistent with known malignancy. 0.5 centimeter satellite nodule is 0.5 centimeters below this mass.   11/22/2018 Cancer Staging   Staging form: Breast, AJCC 8th Edition - Clinical stage from 11/22/2018: Stage IIB (cT2, cN0, cM0, G3, ER-, PR-, HER2-) - Signed by Nicholas Lose, MD on 11/22/2018   11/27/2018 Genetic Testing   Patient had genetic testing done for a personal and family history of breast cancer.  Results were negative on the [facility] Breast cancer STAT gene panel. The STAT Breast cancer panel offered by [facility] includes sequencing and rearrangement analysis for the following 7 genes:  BRCA1, BRCA2, CDH1, PALB2, PTEN, STK11 and TP53.  The report date is 11/27/2018.   12/01/2018 -  Chemotherapy   The patient had DOXOrubicin (ADRIAMYCIN) chemo injection 114 mg, 60 mg/m2 = 114 mg, Intravenous,  Once, 1 of 4 cycles palonosetron (ALOXI) injection 0.25 mg, 0.25 mg, Intravenous,  Once, 1 of 8 cycles pegfilgrastim-jmdb (FULPHILA) injection 6 mg, 6 mg, Subcutaneous,  Once, 1 of 4 cycles CARBOplatin (PARAPLATIN) 480 mg in sodium chloride 0.9 % 250 mL chemo infusion, 480 mg (100 % of original dose 478.8 mg), Intravenous,  Once, 0 of 4 cycles  Dose modification:   (original dose 478.8 mg, Cycle 5) cyclophosphamide (CYTOXAN) 1,140 mg in sodium chloride 0.9 % 250 mL chemo infusion, 600 mg/m2 = 1,140 mg, Intravenous,  Once, 1 of 4 cycles PACLitaxel (TAXOL) 150 mg in sodium chloride 0.9 % 250 mL chemo infusion (</= 83m/m2), 80 mg/m2 = 150 mg, Intravenous,  Once, 0 of 4 cycles fosaprepitant (EMEND) 150 mg, dexamethasone (DECADRON) 12 mg in sodium chloride 0.9 % 145 mL IVPB, , Intravenous,  Once, 1 of 8 cycles  for chemotherapy treatment.      CHIEF COMPLIANT: Cycle 1 Adriamycin and Cytoxan  INTERVAL HISTORY: Carol MANWARRENis a 73y.o. with above-mentioned history of left breast cancer currently on neoadjuvant chemotherapy with dose dense Adriamycin and Cytoxan. She is a participant in the [employer] clinical trial. Echo on 11/26/18 showed an ejection fraction in the range of 60-65%. Her port was inserted by Dr. WDonne Hazelon 11/30/18. She presents to the clinic today for cycle 1.   REVIEW OF SYSTEMS:   Constitutional: Denies fevers, chills or abnormal weight loss Eyes: Denies blurriness of vision Ears, nose, mouth, throat, and face: Denies mucositis or sore throat Respiratory: Denies cough, dyspnea or wheezes Cardiovascular: Denies palpitation, chest discomfort Gastrointestinal: Denies nausea, heartburn or change in bowel habits Skin: Denies abnormal skin rashes Lymphatics: Denies new lymphadenopathy or easy bruising Neurological: Denies numbness, tingling or new weaknesses Behavioral/Psych: Mood is stable, no new changes  Extremities: No lower extremity edema Breast: denies any pain or lumps or nodules in either breasts  All other systems were reviewed with the patient and are negative.  I have reviewed the past medical history, past surgical history, social history and family history with the patient and they are unchanged from previous note.  ALLERGIES:  is allergic to alpha-gal.  MEDICATIONS:  Current Outpatient Medications   Medication Sig Dispense Refill  . atorvastatin (LIPITOR) 20 MG tablet Take 20 mg by mouth See admin instructions. Takes in the evening on Tuesdays only    . Biotin 5000 MCG TABS Take 5,000 mcg by mouth daily. 30 tablet   . cholecalciferol (VITAMIN D3) 25 MCG (1000 UT) tablet Take 2,000 Units by mouth daily.    Marland Kitchen dexamethasone (DECADRON) 4 MG tablet Take 1 tablet day after chemo and 1 tablet 2 days after chemo with food 8 tablet 0  . ezetimibe (ZETIA) 10 MG tablet Take 10 mg by mouth at bedtime.    . finasteride (PROSCAR) 5 MG tablet Take 2.5 mg by mouth at bedtime.    Marland Kitchen levothyroxine (SYNTHROID) 88 MCG tablet Take 88 mcg by mouth daily before breakfast.    . lidocaine-prilocaine (EMLA) cream Apply to affected area once 30 g 3  . LORazepam (ATIVAN) 0.5 MG tablet Take 1 tablet (0.5 mg total) by mouth every 6 (six) hours as needed for sleep. 30 tablet 0  . Methylcellulose, Laxative, (CITRUCEL) 500 MG TABS Take 500 mg by mouth daily.    . ondansetron (ZOFRAN) 8 MG tablet Take 1 tablet (8 mg total) by mouth 2 (two) times daily as needed. Start on the third day after chemotherapy. 30 tablet 1  . pantoprazole (PROTONIX) 40 MG tablet Take 40 mg by mouth 2 (two) times daily before a meal.    . Polyethyl Glycol-Propyl Glycol (SYSTANE) 0.4-0.3 % SOLN Place 1 drop into both eyes at bedtime as needed (dry eyes).    . prochlorperazine (COMPAZINE) 10 MG tablet Take 1 tablet (10 mg total) by mouth every 6 (six) hours as needed (Nausea or vomiting). 30 tablet 1  . telmisartan (MICARDIS) 40 MG tablet Take 40 mg by mouth daily.    . traMADol (ULTRAM) 50 MG tablet Take 2 tablets (100 mg total) by mouth every 6 (six) hours as needed. 5 tablet 0   No current facility-administered medications for this visit.    Facility-Administered Medications Ordered in Other Visits  Medication Dose Route Frequency Provider Last Rate Last Dose  . cyclophosphamide (CYTOXAN) 1,140 mg in sodium chloride 0.9 % 250 mL chemo infusion   600 mg/m2 (Treatment Plan Recorded) Intravenous Once Nicholas Lose, MD      . DOXOrubicin (ADRIAMYCIN) chemo injection 114 mg  60 mg/m2 (Treatment Plan Recorded) Intravenous Once Nicholas Lose, MD   114 mg at 12/01/18 1036  . heparin lock flush 100 unit/mL  500 Units Intracatheter Once PRN Nicholas Lose, MD      . sodium chloride flush (NS) 0.9 % injection 10 mL  10 mL Intracatheter PRN Nicholas Lose, MD        PHYSICAL EXAMINATION: ECOG PERFORMANCE STATUS: 0 - Asymptomatic  There were no vitals filed for this visit. There were no vitals filed for this visit.  GENERAL: alert, no distress and comfortable SKIN: skin color, texture, turgor are normal, no rashes or significant lesions EYES: normal, Conjunctiva are pink and non-injected, sclera clear OROPHARYNX: no exudate, no erythema and lips, buccal mucosa, and tongue normal  NECK: supple, thyroid normal size, non-tender, without nodularity LYMPH: no palpable lymphadenopathy in the cervical, axillary or inguinal LUNGS: clear  to auscultation and percussion with normal breathing effort HEART: regular rate & rhythm and no murmurs and no lower extremity edema ABDOMEN: abdomen soft, non-tender and normal bowel sounds MUSCULOSKELETAL: no cyanosis of digits and no clubbing  NEURO: alert & oriented x 3 with fluent speech, no focal motor/sensory deficits EXTREMITIES: No lower extremity edema  LABORATORY DATA:  I have reviewed the data as listed CMP Latest Ref Rng & Units 12/01/2018 11/26/2018 11/17/2018  Glucose 70 - 99 mg/dL 130(H) 86 -  BUN 8 - 23 mg/dL 25(H) 23 -  Creatinine 0.44 - 1.00 mg/dL 1.13(H) 1.22(H) 1.10(H)  Sodium 135 - 145 mmol/L 140 139 -  Potassium 3.5 - 5.1 mmol/L 3.7 4.4 -  Chloride 98 - 111 mmol/L 103 105 -  CO2 22 - 32 mmol/L 25 24 -  Calcium 8.9 - 10.3 mg/dL 8.5(L) 9.0 -  Total Protein 6.5 - 8.1 g/dL 6.8 - -  Total Bilirubin 0.3 - 1.2 mg/dL 0.6 - -  Alkaline Phos 38 - 126 U/L 55 - -  AST 15 - 41 U/L 17 - -  ALT 0 - 44  U/L 15 - -    Lab Results  Component Value Date   WBC 10.6 (H) 12/01/2018   HGB 12.6 12/01/2018   HCT 37.6 12/01/2018   MCV 95.4 12/01/2018   PLT 227 12/01/2018   NEUTROABS 7.7 12/01/2018    ASSESSMENT & PLAN:  Malignant neoplasm of upper-outer quadrant of left breast in female, estrogen receptor negative ([place]) 11/08/2018: Routine screening mammogram detected 2 masses in the left breast, measuring 1.6cm and 1.1cm, spanning a total of 3cm. Biopsy confirmed IDC with DCIS, HER-2 negative by FISH (2+ by IHC), ER/PR negative, Ki67 80%.  Staging: T2N0 stage IIb clinical stage Breast MRI: 11/18/2018: 2.9 cm bilobed mass UOQ left breast  Treatment plan 1.  Neoadjuvant chemotherapy with dose dense Adriamycin and Cytoxan x4 followed by Taxol and carboplatin 2. Breast conserving surgery with sentinel lymph node biopsy 2.  Followed by adjuvant radiation -------------------------------------------------------------------------------------------------- Current treatment: Cycle 1 day 1 dose since he remains on Cytoxan Echocardiogram 11/26/2018: EF 60 to 65% Labs reviewed Chemoradiation completed Antiemetics were reviewed Return to clinic in 1 week for toxicity check and follow-up  No orders of the defined types were placed in this encounter.  The patient has a good understanding of the overall plan. she agrees with it. she will call with any problems that may develop before the next visit here.  Nicholas Lose, MD 12/01/2018  Julious Oka Dorshimer am acting as scribe for Dr. Nicholas Lose.  I have reviewed the above documentation for accuracy and completeness, and I agree with the above.

## 2018-11-30 NOTE — Op Note (Signed)
Preoperative diagnosis: Clinical stage 2 triple negative left breast cancer Postoperative diagnosis: Same as above Procedure: Right internal jugular port placement with ultrasound guidance Surgeon: Dr. Serita Grammes Anesthesia: General Estimate blood loss: Minimal Complications: None Drains: None Specimens: None Special count was correct at completion Disposition to recovery stable condition  Indications: This a 2 yof with a clinical stage II left breast tn cancer.  Nodes are negative. She has seen oncology and we have elected to proceed with primary chemotherapy. She needs a port for systemic therapy.  Procedure: After informed consent was obtained the patient was taken to the operating room.  She was given antibiotics.  SCDs were in place.  She was then placed under general anesthesia without complications.  She was padded and her arms placed at her side.  She was then prepped and draped in the standard sterile surgical fashion.  Surgical timeout was then performed.  I used the ultrasound to identify the right internal jugular vein.   I then was able to access the internal jugular vein with a needle on the first pass.  I then placed the wire.  The wire was confirmed to be in the vein by ultrasound.  I then did fluoroscopy and the wire was in the correct position as well.  I then infiltrated Marcaine in her upper right chest and made an incision. I developed a pocket below this. I then used the [employer] to create the tunnel and brought the line from the neck incision to the chest wall.  I then placed the dilator over the wire under fluoroscopic vision.  I removed the wire assembly.  I then placed a line through the dilator and removed the peel-away sheath.  I pulled this back to be in the superior vena cava.  I then hooked this up to the port.  I sutured the port down with 2-0 Prolene suture.  The port flushed easily and withdrew blood.I accessed it and left the accesss needle in  place for chemotherapy tomorrow.   This was passed off the table.  I then placed heparin in the port.  I then closed this with 3-0 Vicryl, 4-0 Monocryl, and glue. I placed a dressing overlying all of this.  She tolerated this well and was transferred to recovery.

## 2018-11-30 NOTE — Anesthesia Procedure Notes (Signed)
Procedure Name: LMA Insertion °Performed by: Adair Lemar H, CRNA °Pre-anesthesia Checklist: Patient identified, Emergency Drugs available, Suction available and Patient being monitored °Patient Re-evaluated:Patient Re-evaluated prior to induction °Oxygen Delivery Method: Circle System Utilized °Preoxygenation: Pre-oxygenation with 100% oxygen °Induction Type: IV induction °Ventilation: Mask ventilation without difficulty °LMA: LMA inserted °LMA Size: 4.0 °Number of attempts: 1 °Airway Equipment and Method: Bite block °Placement Confirmation: positive ETCO2 °Tube secured with: Tape °Dental Injury: Teeth and Oropharynx as per pre-operative assessment  ° ° ° ° ° ° °

## 2018-11-30 NOTE — Anesthesia Postprocedure Evaluation (Signed)
Anesthesia Post Note  Patient: Carol Byrd  Procedure(s) Performed: INSERTION PORT-A-CATH WITH ULTRASOUND (Right Chest)     Patient location during evaluation: PACU Anesthesia Type: General Level of consciousness: awake and alert Pain management: pain level controlled Vital Signs Assessment: post-procedure vital signs reviewed and stable Respiratory status: spontaneous breathing, nonlabored ventilation and respiratory function stable Cardiovascular status: blood pressure returned to baseline and stable Postop Assessment: no apparent nausea or vomiting Anesthetic complications: no    Last Vitals:  Vitals:   11/30/18 0942 11/30/18 0952  BP: (!) 148/77 (!) 146/68  Pulse: 63 62  Resp: 18 18  Temp: (!) 36.4 C   SpO2: 100% 99%    Last Pain:  Vitals:   11/30/18 0832  TempSrc:   PainSc: 0-No pain                 Elizabella Nolet,W. EDMOND

## 2018-12-01 ENCOUNTER — Encounter: Payer: Self-pay | Admitting: *Deleted

## 2018-12-01 ENCOUNTER — Inpatient Hospital Stay: Payer: Medicare Other | Admitting: Hematology and Oncology

## 2018-12-01 ENCOUNTER — Inpatient Hospital Stay: Payer: Medicare Other

## 2018-12-01 ENCOUNTER — Other Ambulatory Visit: Payer: Self-pay

## 2018-12-01 ENCOUNTER — Encounter (HOSPITAL_COMMUNITY): Payer: Self-pay | Admitting: General Surgery

## 2018-12-01 VITALS — BP 143/63 | HR 70 | Resp 19 | Wt 170.5 lb

## 2018-12-01 DIAGNOSIS — C50412 Malignant neoplasm of upper-outer quadrant of left female breast: Secondary | ICD-10-CM

## 2018-12-01 DIAGNOSIS — Z171 Estrogen receptor negative status [ER-]: Secondary | ICD-10-CM

## 2018-12-01 DIAGNOSIS — Z801 Family history of malignant neoplasm of trachea, bronchus and lung: Secondary | ICD-10-CM

## 2018-12-01 DIAGNOSIS — Z7289 Other problems related to lifestyle: Secondary | ICD-10-CM | POA: Diagnosis not present

## 2018-12-01 DIAGNOSIS — Z95828 Presence of other vascular implants and grafts: Secondary | ICD-10-CM | POA: Insufficient documentation

## 2018-12-01 DIAGNOSIS — Z803 Family history of malignant neoplasm of breast: Secondary | ICD-10-CM

## 2018-12-01 DIAGNOSIS — Z87891 Personal history of nicotine dependence: Secondary | ICD-10-CM | POA: Diagnosis not present

## 2018-12-01 DIAGNOSIS — Z800 Family history of malignant neoplasm of digestive organs: Secondary | ICD-10-CM | POA: Diagnosis not present

## 2018-12-01 DIAGNOSIS — Z5111 Encounter for antineoplastic chemotherapy: Secondary | ICD-10-CM | POA: Diagnosis not present

## 2018-12-01 DIAGNOSIS — Z808 Family history of malignant neoplasm of other organs or systems: Secondary | ICD-10-CM

## 2018-12-01 DIAGNOSIS — D701 Agranulocytosis secondary to cancer chemotherapy: Secondary | ICD-10-CM | POA: Diagnosis not present

## 2018-12-01 HISTORY — DX: Presence of other vascular implants and grafts: Z95.828

## 2018-12-01 LAB — CBC WITH DIFFERENTIAL (CANCER CENTER ONLY)
Abs Immature Granulocytes: 0.03 10*3/uL (ref 0.00–0.07)
Basophils Absolute: 0 10*3/uL (ref 0.0–0.1)
Basophils Relative: 0 %
Eosinophils Absolute: 0.1 10*3/uL (ref 0.0–0.5)
Eosinophils Relative: 1 %
HCT: 37.6 % (ref 36.0–46.0)
Hemoglobin: 12.6 g/dL (ref 12.0–15.0)
Immature Granulocytes: 0 %
Lymphocytes Relative: 19 %
Lymphs Abs: 2 10*3/uL (ref 0.7–4.0)
MCH: 32 pg (ref 26.0–34.0)
MCHC: 33.5 g/dL (ref 30.0–36.0)
MCV: 95.4 fL (ref 80.0–100.0)
Monocytes Absolute: 0.8 10*3/uL (ref 0.1–1.0)
Monocytes Relative: 7 %
Neutro Abs: 7.7 10*3/uL (ref 1.7–7.7)
Neutrophils Relative %: 73 %
Platelet Count: 227 10*3/uL (ref 150–400)
RBC: 3.94 MIL/uL (ref 3.87–5.11)
RDW: 11.6 % (ref 11.5–15.5)
WBC Count: 10.6 10*3/uL — ABNORMAL HIGH (ref 4.0–10.5)
nRBC: 0 % (ref 0.0–0.2)

## 2018-12-01 LAB — CMP (CANCER CENTER ONLY)
ALT: 15 U/L (ref 0–44)
AST: 17 U/L (ref 15–41)
Albumin: 3.8 g/dL (ref 3.5–5.0)
Alkaline Phosphatase: 55 U/L (ref 38–126)
Anion gap: 12 (ref 5–15)
BUN: 25 mg/dL — ABNORMAL HIGH (ref 8–23)
CO2: 25 mmol/L (ref 22–32)
Calcium: 8.5 mg/dL — ABNORMAL LOW (ref 8.9–10.3)
Chloride: 103 mmol/L (ref 98–111)
Creatinine: 1.13 mg/dL — ABNORMAL HIGH (ref 0.44–1.00)
GFR, Est AFR Am: 55 mL/min — ABNORMAL LOW (ref >60)
GFR, Estimated: 48 mL/min — ABNORMAL LOW (ref >60)
Glucose, Bld: 130 mg/dL — ABNORMAL HIGH (ref 70–99)
Potassium: 3.7 mmol/L (ref 3.5–5.1)
Sodium: 140 mmol/L (ref 135–145)
Total Bilirubin: 0.6 mg/dL (ref 0.3–1.2)
Total Protein: 6.8 g/dL (ref 6.5–8.1)

## 2018-12-01 MED ORDER — SODIUM CHLORIDE 0.9% FLUSH
10.0000 mL | INTRAVENOUS | Status: DC | PRN
  Administered 2018-12-01: 10 mL
  Filled 2018-12-01: qty 10

## 2018-12-01 MED ORDER — SODIUM CHLORIDE 0.9 % IV SOLN
Freq: Once | INTRAVENOUS | Status: AC
  Administered 2018-12-01: 09:00:00 via INTRAVENOUS
  Filled 2018-12-01: qty 250

## 2018-12-01 MED ORDER — SODIUM CHLORIDE 0.9 % IV SOLN
600.0000 mg/m2 | Freq: Once | INTRAVENOUS | Status: AC
  Administered 2018-12-01: 1140 mg via INTRAVENOUS
  Filled 2018-12-01: qty 57

## 2018-12-01 MED ORDER — SODIUM CHLORIDE 0.9 % IV SOLN
Freq: Once | INTRAVENOUS | Status: AC
  Administered 2018-12-01: 10:00:00 via INTRAVENOUS
  Filled 2018-12-01: qty 5

## 2018-12-01 MED ORDER — PALONOSETRON HCL INJECTION 0.25 MG/5ML
INTRAVENOUS | Status: AC
  Filled 2018-12-01: qty 5

## 2018-12-01 MED ORDER — DOXORUBICIN HCL CHEMO IV INJECTION 2 MG/ML
60.0000 mg/m2 | Freq: Once | INTRAVENOUS | Status: AC
  Administered 2018-12-01: 114 mg via INTRAVENOUS
  Filled 2018-12-01: qty 57

## 2018-12-01 MED ORDER — HEPARIN SOD (PORK) LOCK FLUSH 100 UNIT/ML IV SOLN
500.0000 [IU] | Freq: Once | INTRAVENOUS | Status: AC | PRN
  Administered 2018-12-01: 500 [IU]
  Filled 2018-12-01: qty 5

## 2018-12-01 MED ORDER — SODIUM CHLORIDE 0.9% FLUSH
10.0000 mL | Freq: Once | INTRAVENOUS | Status: AC
  Administered 2018-12-01: 10 mL
  Filled 2018-12-01: qty 10

## 2018-12-01 MED ORDER — PALONOSETRON HCL INJECTION 0.25 MG/5ML
0.2500 mg | Freq: Once | INTRAVENOUS | Status: AC
  Administered 2018-12-01: 0.25 mg via INTRAVENOUS

## 2018-12-01 NOTE — Assessment & Plan Note (Addendum)
11/08/2018: Routine screening mammogram detected 2 masses in the left breast, measuring 1.6cm and 1.1cm, spanning a total of 3cm. Biopsy confirmed IDC with DCIS, HER-2 negative by FISH (2+ by IHC), ER/PR negative, Ki67 80%.  Staging: T2N0 stage IIb clinical stage Breast MRI: 11/18/2018: 2.9 cm bilobed mass UOQ left breast  Treatment plan 1.  Neoadjuvant chemotherapy with dose dense Adriamycin and Cytoxan x4 followed by Taxol and carboplatin 2. Breast conserving surgery with sentinel lymph node biopsy 2.  Followed by adjuvant radiation -------------------------------------------------------------------------------------------------- Current treatment: Cycle 1 day 1 dose since he remains on Cytoxan Labs reviewed Chemoradiation completed Antiemetics were reviewed Return to clinic in 1 week for toxicity check and follow-up

## 2018-12-01 NOTE — Patient Instructions (Signed)
[facility] Discharge Instructions for Patients Receiving Chemotherapy  Today you received the following chemotherapy agents: Doxorubicin (Adriamycin) and Cyclophosphamide (Cytoxan)  To help prevent nausea and vomiting after your treatment, we encourage you to take your nausea medication as directed. Received Aloxi during treatment today-->Take your Compazine or Ativan prescription (not your Zofran) for the next 3 days as needed.    If you develop nausea and vomiting that is not controlled by your nausea medication, call the clinic.   BELOW ARE SYMPTOMS THAT SHOULD BE REPORTED IMMEDIATELY:  *FEVER GREATER THAN 100.5 F  *CHILLS WITH OR WITHOUT FEVER  NAUSEA AND VOMITING THAT IS NOT CONTROLLED WITH YOUR NAUSEA MEDICATION  *UNUSUAL SHORTNESS OF BREATH  *UNUSUAL BRUISING OR BLEEDING  TENDERNESS IN MOUTH AND THROAT WITH OR WITHOUT PRESENCE OF ULCERS  *URINARY PROBLEMS  *BOWEL PROBLEMS  UNUSUAL RASH Items with * indicate a potential emergency and should be followed up as soon as possible.  Feel free to call the clinic should you have any questions or concerns. The clinic phone number is [phone].  Please show the [facility] at check-in to the Emergency Department and triage nurse.  Doxorubicin injection What is this medicine? DOXORUBICIN (dox oh ROO bi sin) is a chemotherapy drug. It is used to treat many kinds of cancer like leukemia, lymphoma, neuroblastoma, sarcoma, and Wilms' tumor. It is also used to treat bladder cancer, breast cancer, lung cancer, ovarian cancer, stomach cancer, and thyroid cancer. This medicine may be used for other purposes; ask your health care provider or pharmacist if you have questions. COMMON BRAND NAME(S): Adriamycin, Adriamycin PFS, Adriamycin RDF, Rubex What should I tell my health care provider before I take this medicine? They need to know if you have any of these conditions:  heart disease  history of low blood  counts caused by a medicine  liver disease  recent or ongoing radiation therapy  an unusual or allergic reaction to doxorubicin, other chemotherapy agents, other medicines, foods, dyes, or preservatives  pregnant or trying to get pregnant  breast-feeding How should I use this medicine? This drug is given as an infusion into a vein. It is administered in a hospital or clinic by a specially trained health care professional. If you have pain, swelling, burning or any unusual feeling around the site of your injection, tell your health care professional right away. Talk to your pediatrician regarding the use of this medicine in children. Special care may be needed. Overdosage: If you think you have taken too much of this medicine contact a poison control center or emergency room at once. NOTE: This medicine is only for you. Do not share this medicine with others. What if I miss a dose? It is important not to miss your dose. Call your doctor or health care professional if you are unable to keep an appointment. What may interact with this medicine? This medicine may interact with the following medications:  6-mercaptopurine  paclitaxel  phenytoin  St. John's Wort  trastuzumab  verapamil This list may not describe all possible interactions. Give your health care provider a list of all the medicines, herbs, non-prescription drugs, or dietary supplements you use. Also tell them if you smoke, drink alcohol, or use illegal drugs. Some items may interact with your medicine. What should I watch for while using this medicine? This drug may make you feel generally unwell. This is not uncommon, as chemotherapy can affect healthy cells as well as cancer cells. Report any side  effects. Continue your course of treatment even though you feel ill unless your doctor tells you to stop. There is a maximum amount of this medicine you should receive throughout your life. The amount depends on the medical  condition being treated and your overall health. Your doctor will watch how much of this medicine you receive in your lifetime. Tell your doctor if you have taken this medicine before. You may need blood work done while you are taking this medicine. Your urine may turn red for a few days after your dose. This is not blood. If your urine is dark or brown, call your doctor. In some cases, you may be given additional medicines to help with side effects. Follow all directions for their use. Call your doctor or health care professional for advice if you get a fever, chills or sore throat, or other symptoms of a cold or flu. Do not treat yourself. This drug decreases your body's ability to fight infections. Try to avoid being around people who are sick. This medicine may increase your risk to bruise or bleed. Call your doctor or health care professional if you notice any unusual bleeding. Talk to your doctor about your risk of cancer. You may be more at risk for certain types of cancers if you take this medicine. Do not become pregnant while taking this medicine or for 6 months after stopping it. Women should inform their doctor if they wish to become pregnant or think they might be pregnant. Men should not father a child while taking this medicine and for 6 months after stopping it. There is a potential for serious side effects to an unborn child. Talk to your health care professional or pharmacist for more information. Do not breast-feed an infant while taking this medicine. This medicine has caused ovarian failure in some women and reduced sperm counts in some men This medicine may interfere with the ability to have a child. Talk with your doctor or health care professional if you are concerned about your fertility. This medicine may cause a decrease in Co-Enzyme Q-10. You should make sure that you get enough Co-Enzyme Q-10 while you are taking this medicine. Discuss the foods you eat and the vitamins you take  with your health care professional. What side effects may I notice from receiving this medicine? Side effects that you should report to your doctor or health care professional as soon as possible:  allergic reactions like skin rash, itching or hives, swelling of the face, lips, or tongue  breathing problems  chest pain  fast or irregular heartbeat  low blood counts - this medicine may decrease the number of white blood cells, red blood cells and platelets. You may be at increased risk for infections and bleeding.  pain, redness, or irritation at site where injected  signs of infection - fever or chills, cough, sore throat, pain or difficulty passing urine  signs of decreased platelets or bleeding - bruising, pinpoint red spots on the skin, black, tarry stools, blood in the urine  swelling of the ankles, feet, hands  tiredness  weakness Side effects that usually do not require medical attention (report to your doctor or health care professional if they continue or are bothersome):  diarrhea  hair loss  mouth sores  nail discoloration or damage  nausea  red colored urine  vomiting This list may not describe all possible side effects. Call your doctor for medical advice about side effects. You may report side effects to FDA at  1-800-FDA-1088. Where should I keep my medicine? This drug is given in a hospital or clinic and will not be stored at home. NOTE: This sheet is a summary. It may not cover all possible information. If you have questions about this medicine, talk to your doctor, pharmacist, or health care provider.  2020 Elsevier/Gold Standard (2016-12-17 11:01:26)  Cyclophosphamide injection What is this medicine? CYCLOPHOSPHAMIDE (sye kloe FOSS fa mide) is a chemotherapy drug. It slows the growth of cancer cells. This medicine is used to treat many types of cancer like lymphoma, myeloma, leukemia, breast cancer, and ovarian cancer, to name a few. This medicine  may be used for other purposes; ask your health care provider or pharmacist if you have questions. COMMON BRAND NAME(S): Cytoxan, Neosar What should I tell my health care provider before I take this medicine? They need to know if you have any of these conditions:  blood disorders  history of other chemotherapy  infection  kidney disease  liver disease  recent or ongoing radiation therapy  tumors in the bone marrow  an unusual or allergic reaction to cyclophosphamide, other chemotherapy, other medicines, foods, dyes, or preservatives  pregnant or trying to get pregnant  breast-feeding How should I use this medicine? This drug is usually given as an injection into a vein or muscle or by infusion into a vein. It is administered in a hospital or clinic by a specially trained health care professional. Talk to your pediatrician regarding the use of this medicine in children. Special care may be needed. Overdosage: If you think you have taken too much of this medicine contact a poison control center or emergency room at once. NOTE: This medicine is only for you. Do not share this medicine with others. What if I miss a dose? It is important not to miss your dose. Call your doctor or health care professional if you are unable to keep an appointment. What may interact with this medicine? This medicine may interact with the following medications:  amiodarone  amphotericin B  azathioprine  certain antiviral medicines for HIV or AIDS such as protease inhibitors (e.g., indinavir, ritonavir) and zidovudine  certain blood pressure medications such as benazepril, captopril, enalapril, fosinopril, lisinopril, moexipril, monopril, perindopril, quinapril, ramipril, trandolapril  certain cancer medications such as anthracyclines (e.g., daunorubicin, doxorubicin), busulfan, cytarabine, paclitaxel, pentostatin, tamoxifen, trastuzumab  certain diuretics such as chlorothiazide, chlorthalidone,  hydrochlorothiazide, indapamide, metolazone  certain medicines that treat or prevent blood clots like warfarin  certain muscle relaxants such as succinylcholine  cyclosporine  etanercept  indomethacin  medicines to increase blood counts like filgrastim, pegfilgrastim, sargramostim  medicines used as general anesthesia  metronidazole  natalizumab This list may not describe all possible interactions. Give your health care provider a list of all the medicines, herbs, non-prescription drugs, or dietary supplements you use. Also tell them if you smoke, drink alcohol, or use illegal drugs. Some items may interact with your medicine. What should I watch for while using this medicine? Visit your doctor for checks on your progress. This drug may make you feel generally unwell. This is not uncommon, as chemotherapy can affect healthy cells as well as cancer cells. Report any side effects. Continue your course of treatment even though you feel ill unless your doctor tells you to stop. Drink water or other fluids as directed. Urinate often, even at night. In some cases, you may be given additional medicines to help with side effects. Follow all directions for their use. Call your doctor or health  care professional for advice if you get a fever, chills or sore throat, or other symptoms of a cold or flu. Do not treat yourself. This drug decreases your body's ability to fight infections. Try to avoid being around people who are sick. This medicine may increase your risk to bruise or bleed. Call your doctor or health care professional if you notice any unusual bleeding. Be careful brushing and flossing your teeth or using a toothpick because you may get an infection or bleed more easily. If you have any dental work done, tell your dentist you are receiving this medicine. You may get drowsy or dizzy. Do not drive, use machinery, or do anything that needs mental alertness until you know how this medicine  affects you. Do not become pregnant while taking this medicine or for 1 year after stopping it. Women should inform their doctor if they wish to become pregnant or think they might be pregnant. Men should not father a child while taking this medicine and for 4 months after stopping it. There is a potential for serious side effects to an unborn child. Talk to your health care professional or pharmacist for more information. Do not breast-feed an infant while taking this medicine. This medicine may interfere with the ability to have a child. This medicine has caused ovarian failure in some women. This medicine has caused reduced sperm counts in some men. You should talk with your doctor or health care professional if you are concerned about your fertility. If you are going to have surgery, tell your doctor or health care professional that you have taken this medicine. What side effects may I notice from receiving this medicine? Side effects that you should report to your doctor or health care professional as soon as possible:  allergic reactions like skin rash, itching or hives, swelling of the face, lips, or tongue  low blood counts - this medicine may decrease the number of white blood cells, red blood cells and platelets. You may be at increased risk for infections and bleeding.  signs of infection - fever or chills, cough, sore throat, pain or difficulty passing urine  signs of decreased platelets or bleeding - bruising, pinpoint red spots on the skin, black, tarry stools, blood in the urine  signs of decreased red blood cells - unusually weak or tired, fainting spells, lightheadedness  breathing problems  dark urine  dizziness  palpitations  swelling of the ankles, feet, hands  trouble passing urine or change in the amount of urine  weight gain  yellowing of the eyes or skin Side effects that usually do not require medical attention (report to your doctor or health care  professional if they continue or are bothersome):  changes in nail or skin color  hair loss  missed menstrual periods  mouth sores  nausea, vomiting This list may not describe all possible side effects. Call your doctor for medical advice about side effects. You may report side effects to FDA at 1-800-FDA-1088. Where should I keep my medicine? This drug is given in a hospital or clinic and will not be stored at home. NOTE: This sheet is a summary. It may not cover all possible information. If you have questions about this medicine, talk to your doctor, pharmacist, or health care provider.  2020 Elsevier/Gold Standard (2012-03-19 16:22:58)  Coronavirus (COVID-19) Are you at risk?  Are you at risk for the Coronavirus (COVID-19)?  To be considered HIGH RISK for Coronavirus (COVID-19), you have to meet the following  criteria:  . Traveled to [place], [place], [place], [place] or [place]; or in the [place] to [place], [place], [place], or [place]; and have fever, cough, and shortness of breath within the last 2 weeks of travel OR . Been in close contact with a person diagnosed with COVID-19 within the last 2 weeks and have fever, cough, and shortness of breath . IF YOU DO NOT MEET THESE CRITERIA, YOU ARE CONSIDERED LOW RISK FOR COVID-19.  What to do if you are HIGH RISK for COVID-19?  Marland Kitchen If you are having a medical emergency, call 911. . Seek medical care right away. Before you go to a doctor's office, urgent care or emergency department, call ahead and tell them about your recent travel, contact with someone diagnosed with COVID-19, and your symptoms. You should receive instructions from your physician's office regarding next steps of care.  . When you arrive at healthcare provider, tell the healthcare staff immediately you have returned from visiting [place], [place], [place], [place] or [place]; or traveled in the [place] to [place], [place], [place], or [place];  in the last two weeks or you have been in close contact with a person diagnosed with COVID-19 in the last 2 weeks.   . Tell the health care staff about your symptoms: fever, cough and shortness of breath. . After you have been seen by a medical provider, you will be either: o Tested for (COVID-19) and discharged home on quarantine except to seek medical care if symptoms worsen, and asked to  - Stay home and avoid contact with others until you get your results (4-5 days)  - Avoid travel on public transportation if possible (such as bus, train, or airplane) or o Sent to the Emergency Department by EMS for evaluation, COVID-19 testing, and possible admission depending on your condition and test results.  What to do if you are LOW RISK for COVID-19?  Reduce your risk of any infection by using the same precautions used for avoiding the common cold or flu:  Marland Kitchen Wash your hands often with soap and warm water for at least 20 seconds.  If soap and water are not readily available, use an alcohol-based hand sanitizer with at least 60% alcohol.  . If coughing or sneezing, cover your mouth and nose by coughing or sneezing into the elbow areas of your shirt or coat, into a tissue or into your sleeve (not your hands). . Avoid shaking hands with others and consider head nods or verbal greetings only. . Avoid touching your eyes, nose, or mouth with unwashed hands.  . Avoid close contact with people who are sick. . Avoid places or events with large numbers of people in one location, like concerts or sporting events. . Carefully consider travel plans you have or are making. . If you are planning any travel outside or inside the [place], visit the CDC's Travelers' Health webpage for the latest health notices. . If you have some symptoms but not all symptoms, continue to monitor at home and seek medical attention if your symptoms worsen. . If you are having a medical emergency, call 911.   [place] / e-Visit: [email or web address]         [facility]: [facility]: [phone]                   [facility]: [phone]

## 2018-12-01 NOTE — Research (Signed)
DCP-001 Use of a Clinical Trial Screening Tool to Address Cancer Health Disparities in the [facility]: Met with patient in infusion room for 5 minutes to review the DCP-001 study and offer her the opportunity to participate. Informed patient that participation is voluntary and involves a one time consent and collection of demographic variables, with the majority of data collected from their medical record. Noted that no patient identifiers are being reported to the study.  Patient declined to participate in this study, stating she is feeling a little overwhelmed right now.  Thanked patient for her time and agreeing to meet with me today. Foye Spurling, BSN, RN Clinical Research Nurse 12/01/2018 11:19 AM

## 2018-12-02 ENCOUNTER — Telehealth: Payer: Self-pay | Admitting: *Deleted

## 2018-12-02 ENCOUNTER — Encounter (HOSPITAL_COMMUNITY): Payer: Self-pay | Admitting: General Surgery

## 2018-12-02 NOTE — Telephone Encounter (Signed)
Received call from husband today informing that they were having concerns over what meds to take & when.  Reviewed Dr Geralyn Flash sheet on supportive meds for [facility] & hopefully helped to understand.  Encouraged pt to bring that sheet with her tomorrow when she comes for injection to make sure she understood. Husband also states pt is very anxious & having a hard time keeping up with everything.  He sounds very supportive & thanked him for taking care of her.  Reassured him that this can be very anxiety provoking & hopefully she will settle in soon as her schedule calms down a bit. Asked if she needed to speak to one of our SW or Chaplain & if that would be helpful, we could arrange.  He will talk with her some more & mention this & if so will let us know.  Message to Dr Rolla Plate RN/[employer].

## 2018-12-02 NOTE — Assessment & Plan Note (Signed)
11/08/2018: Routine screening mammogram detected 2 masses in the left breast, measuring 1.6cm and 1.1cm, spanning a total of 3cm. Biopsy confirmed IDC with DCIS, HER-2 negative by FISH (2+ by IHC), ER/PR negative, Ki67 80%.  Staging: T2N0 stage IIb clinical stage Breast MRI: 11/18/2018: 2.9 cm bilobed mass UOQ left breast  Treatment plan 1.Neoadjuvant chemotherapy with dose dense Adriamycin and Cytoxan x4 followed by Taxol and carboplatin 2.Breast conserving surgery with sentinel lymph node biopsy 2.Followed by adjuvant radiation -------------------------------------------------------------------------------------------------- Current treatment: Cycle 1 day 8 dose since he remains on Cytoxan Echocardiogram 11/26/2018: EF 60 to 65% Labs reviewed  Chemo toxicities:  Return to clinic in 1 week for cycle 2

## 2018-12-02 NOTE — Telephone Encounter (Signed)
RN placed call to follow after patient received first time chemotherapy.  Patient doing well.  Pt denies nausea, and diarrhea.  Tolerating fluids well.  All questions were answered.  No further needs.

## 2018-12-03 ENCOUNTER — Inpatient Hospital Stay: Payer: Medicare Other

## 2018-12-03 ENCOUNTER — Telehealth: Payer: Self-pay | Admitting: *Deleted

## 2018-12-03 ENCOUNTER — Other Ambulatory Visit: Payer: Self-pay

## 2018-12-03 VITALS — BP 145/66 | HR 66 | Temp 98.9°F | Resp 18

## 2018-12-03 DIAGNOSIS — C50412 Malignant neoplasm of upper-outer quadrant of left female breast: Secondary | ICD-10-CM

## 2018-12-03 DIAGNOSIS — Z808 Family history of malignant neoplasm of other organs or systems: Secondary | ICD-10-CM | POA: Diagnosis not present

## 2018-12-03 DIAGNOSIS — Z7289 Other problems related to lifestyle: Secondary | ICD-10-CM | POA: Diagnosis not present

## 2018-12-03 DIAGNOSIS — Z171 Estrogen receptor negative status [ER-]: Secondary | ICD-10-CM

## 2018-12-03 DIAGNOSIS — Z800 Family history of malignant neoplasm of digestive organs: Secondary | ICD-10-CM | POA: Diagnosis not present

## 2018-12-03 DIAGNOSIS — Z5111 Encounter for antineoplastic chemotherapy: Secondary | ICD-10-CM | POA: Diagnosis not present

## 2018-12-03 DIAGNOSIS — Z803 Family history of malignant neoplasm of breast: Secondary | ICD-10-CM | POA: Diagnosis not present

## 2018-12-03 DIAGNOSIS — Z87891 Personal history of nicotine dependence: Secondary | ICD-10-CM | POA: Diagnosis not present

## 2018-12-03 DIAGNOSIS — D701 Agranulocytosis secondary to cancer chemotherapy: Secondary | ICD-10-CM | POA: Diagnosis not present

## 2018-12-03 DIAGNOSIS — Z801 Family history of malignant neoplasm of trachea, bronchus and lung: Secondary | ICD-10-CM | POA: Diagnosis not present

## 2018-12-03 MED ORDER — PEGFILGRASTIM-JMDB 6 MG/0.6ML ~~LOC~~ SOSY
6.0000 mg | PREFILLED_SYRINGE | Freq: Once | SUBCUTANEOUS | Status: AC
  Administered 2018-12-03: 6 mg via SUBCUTANEOUS
  Filled 2018-12-03: qty 0.6

## 2018-12-03 NOTE — Patient Instructions (Signed)

## 2018-12-03 NOTE — Telephone Encounter (Signed)
12/03/18 9:57 AM  This RN notified that patient had changed her mind and was interested in [place] participation.  This RN called patient and explained that study participation would have had to start prior to chemotherapy, so the patient was not longer eligible.  Expressed appreciation to the patient for considering participation.  Patient verbalized understanding and expressed appreciation for notification.  Doreatha Martin, RN, BSN, [facility] 12/03/2018 9:58 AM

## 2018-12-06 ENCOUNTER — Encounter: Payer: Self-pay | Admitting: *Deleted

## 2018-12-06 ENCOUNTER — Telehealth: Payer: Self-pay | Admitting: *Deleted

## 2018-12-06 NOTE — Telephone Encounter (Signed)
Received call from pt regarding her apt on 12/01/2018 with Dr. Lindi Adie.  Pt states her mychart shows that this apt was marked as a "no show".  Pt did see Dr. Lindi Adie in the infusion suite and would like for this "no show" to be removed from her records.   RN emailed [facility] manager regarding situation to address it and to call pt when it has been corrected.

## 2018-12-07 ENCOUNTER — Telehealth: Payer: Self-pay | Admitting: *Deleted

## 2018-12-07 NOTE — Telephone Encounter (Signed)
Received call from pt stating she has experienced diarrhea x3 today.  Pt wanting to know if it was okay for her to take OTC imodium.  RN educated pt to take imodium as directed on the box and to drink plenty of fluids to prevent dehydration. Pt has an apt with Dr. Lindi Adie tomorrow and will let us know then if she is still experiencing diarrhea.  Pt verbalized understanding and appreciative of call.

## 2018-12-07 NOTE — Progress Notes (Signed)
Patient Care Team: [name], MD as PCP - General (Internal Medicine) Satira Sark, MD as PCP - Cardiology (Cardiology)  DIAGNOSIS:    ICD-10-CM   1. Malignant neoplasm of upper-outer quadrant of left breast in female, estrogen receptor negative ([place])  C50.412    Z17.1     SUMMARY OF ONCOLOGIC HISTORY: Oncology History  Malignant neoplasm of upper-outer quadrant of left breast in female, estrogen receptor negative ([place])  11/08/2018 Initial Diagnosis   Routine screening mammogram detected 2 masses in the left breast, measuring 1.6cm and 1.1cm, spanning a total of 3cm. Biopsy confirmed IDC with DCIS, HER-2 negative by FISH (2+ by IHC), ER/PR negative, Ki67 80%.    11/18/2018 Breast MRI   2.9 centimeter bilobed mass in the UOQ of the left breast, consistent with known malignancy. 0.5 centimeter satellite nodule is 0.5 centimeters below this mass.   11/22/2018 Cancer Staging   Staging form: Breast, AJCC 8th Edition - Clinical stage from 11/22/2018: Stage IIB (cT2, cN0, cM0, G3, ER-, PR-, HER2-) - Signed by Nicholas Lose, MD on 11/22/2018   11/27/2018 Genetic Testing   Patient had genetic testing done for a personal and family history of breast cancer.  Results were negative on the [facility] Breast cancer STAT gene panel. The STAT Breast cancer panel offered by [facility] includes sequencing and rearrangement analysis for the following 7 genes:  BRCA1, BRCA2, CDH1, PALB2, PTEN, STK11 and TP53.  The report date is 11/27/2018.   12/01/2018 -  Chemotherapy   The patient had DOXOrubicin (ADRIAMYCIN) chemo injection 114 mg, 60 mg/m2 = 114 mg, Intravenous,  Once, 1 of 4 cycles Administration: 114 mg (12/01/2018) palonosetron (ALOXI) injection 0.25 mg, 0.25 mg, Intravenous,  Once, 1 of 8 cycles Administration: 0.25 mg (12/01/2018) pegfilgrastim-jmdb (FULPHILA) injection 6 mg, 6 mg, Subcutaneous,  Once, 1 of 4 cycles Administration: 6 mg (12/03/2018) CARBOplatin (PARAPLATIN) 480 mg in sodium chloride  0.9 % 250 mL chemo infusion, 480 mg (100 % of original dose 478.8 mg), Intravenous,  Once, 0 of 4 cycles Dose modification:   (original dose 478.8 mg, Cycle 5) cyclophosphamide (CYTOXAN) 1,140 mg in sodium chloride 0.9 % 250 mL chemo infusion, 600 mg/m2 = 1,140 mg, Intravenous,  Once, 1 of 4 cycles Administration: 1,140 mg (12/01/2018) PACLitaxel (TAXOL) 150 mg in sodium chloride 0.9 % 250 mL chemo infusion (</= 6m/m2), 80 mg/m2 = 150 mg, Intravenous,  Once, 0 of 4 cycles fosaprepitant (EMEND) 150 mg, dexamethasone (DECADRON) 12 mg in sodium chloride 0.9 % 145 mL IVPB, , Intravenous,  Once, 1 of 8 cycles Administration:  (12/01/2018)  for chemotherapy treatment.      CHIEF COMPLIANT: Cycle 1 Day 8 Adriamycin and Cytoxan  INTERVAL HISTORY: Carol BOEREMAis a 73y.o. with above-mentioned history of left breast cancer currently on neoadjuvant chemotherapy with dose dense Adriamycin and Cytoxan. She presents to the clinic today for a toxicity check following cycle 1.   REVIEW OF SYSTEMS:   Constitutional: Denies fevers, chills or abnormal weight loss Eyes: Denies blurriness of vision Ears, nose, mouth, throat, and face: Denies mucositis or sore throat Respiratory: Denies cough, dyspnea or wheezes Cardiovascular: Denies palpitation, chest discomfort Gastrointestinal: Denies nausea, heartburn or change in bowel habits Skin: Denies abnormal skin rashes Lymphatics: Denies new lymphadenopathy or easy bruising Neurological: Denies numbness, tingling or new weaknesses Behavioral/Psych: Mood is stable, no new changes  Extremities: No lower extremity edema Breast: denies any pain or lumps or nodules in either breasts All other systems were reviewed  with the patient and are negative.  I have reviewed the past medical history, past surgical history, social history and family history with the patient and they are unchanged from previous note.  ALLERGIES:  is allergic to alpha-gal.  MEDICATIONS:   Current Outpatient Medications  Medication Sig Dispense Refill  . atorvastatin (LIPITOR) 20 MG tablet Take 20 mg by mouth See admin instructions. Takes in the evening on Tuesdays only    . Biotin 5000 MCG TABS Take 5,000 mcg by mouth daily. 30 tablet   . cholecalciferol (VITAMIN D3) 25 MCG (1000 UT) tablet Take 2,000 Units by mouth daily.    Marland Kitchen dexamethasone (DECADRON) 4 MG tablet Take 1 tablet day after chemo and 1 tablet 2 days after chemo with food 8 tablet 0  . ezetimibe (ZETIA) 10 MG tablet Take 10 mg by mouth at bedtime.    . finasteride (PROSCAR) 5 MG tablet Take 2.5 mg by mouth at bedtime.    Marland Kitchen levothyroxine (SYNTHROID) 88 MCG tablet Take 88 mcg by mouth daily before breakfast.    . lidocaine-prilocaine (EMLA) cream Apply to affected area once 30 g 3  . LORazepam (ATIVAN) 0.5 MG tablet Take 1 tablet (0.5 mg total) by mouth every 6 (six) hours as needed for sleep. 30 tablet 0  . Methylcellulose, Laxative, (CITRUCEL) 500 MG TABS Take 500 mg by mouth daily.    . ondansetron (ZOFRAN) 8 MG tablet Take 1 tablet (8 mg total) by mouth 2 (two) times daily as needed. Start on the third day after chemotherapy. 30 tablet 1  . pantoprazole (PROTONIX) 40 MG tablet Take 40 mg by mouth 2 (two) times daily before a meal.    . Polyethyl Glycol-Propyl Glycol (SYSTANE) 0.4-0.3 % SOLN Place 1 drop into both eyes at bedtime as needed (dry eyes).    . prochlorperazine (COMPAZINE) 10 MG tablet Take 1 tablet (10 mg total) by mouth every 6 (six) hours as needed (Nausea or vomiting). 30 tablet 1  . telmisartan (MICARDIS) 40 MG tablet Take 40 mg by mouth daily.    . traMADol (ULTRAM) 50 MG tablet Take 2 tablets (100 mg total) by mouth every 6 (six) hours as needed. 5 tablet 0   No current facility-administered medications for this visit.     PHYSICAL EXAMINATION: ECOG PERFORMANCE STATUS: 1 - Symptomatic but completely ambulatory  Vitals:   12/08/18 1539  BP: 131/69  Pulse: 98  Resp: 17  Temp: 98 F (36.7  C)  SpO2: 97%   Filed Weights   12/08/18 1539  Weight: 167 lb 3.2 oz (75.8 kg)    GENERAL: alert, no distress and comfortable SKIN: skin color, texture, turgor are normal, no rashes or significant lesions EYES: normal, Conjunctiva are pink and non-injected, sclera clear OROPHARYNX: no exudate, no erythema and lips, buccal mucosa, and tongue normal  NECK: supple, thyroid normal size, non-tender, without nodularity LYMPH: no palpable lymphadenopathy in the cervical, axillary or inguinal LUNGS: clear to auscultation and percussion with normal breathing effort HEART: regular rate & rhythm and no murmurs and no lower extremity edema ABDOMEN: abdomen soft, non-tender and normal bowel sounds MUSCULOSKELETAL: no cyanosis of digits and no clubbing  NEURO: alert & oriented x 3 with fluent speech, no focal motor/sensory deficits EXTREMITIES: No lower extremity edema  LABORATORY DATA:  I have reviewed the data as listed CMP Latest Ref Rng & Units 12/01/2018 11/26/2018 11/17/2018  Glucose 70 - 99 mg/dL 130(H) 86 -  BUN 8 - 23 mg/dL 25(H) 23 -  Creatinine 0.44 - 1.00 mg/dL 1.13(H) 1.22(H) 1.10(H)  Sodium 135 - 145 mmol/L 140 139 -  Potassium 3.5 - 5.1 mmol/L 3.7 4.4 -  Chloride 98 - 111 mmol/L 103 105 -  CO2 22 - 32 mmol/L 25 24 -  Calcium 8.9 - 10.3 mg/dL 8.5(L) 9.0 -  Total Protein 6.5 - 8.1 g/dL 6.8 - -  Total Bilirubin 0.3 - 1.2 mg/dL 0.6 - -  Alkaline Phos 38 - 126 U/L 55 - -  AST 15 - 41 U/L 17 - -  ALT 0 - 44 U/L 15 - -    Lab Results  Component Value Date   WBC 10.6 (H) 12/01/2018   HGB 12.6 12/01/2018   HCT 37.6 12/01/2018   MCV 95.4 12/01/2018   PLT 227 12/01/2018   NEUTROABS 7.7 12/01/2018    ASSESSMENT & PLAN:  Malignant neoplasm of upper-outer quadrant of left breast in female, estrogen receptor negative ([place]) 11/08/2018: Routine screening mammogram detected 2 masses in the left breast, measuring 1.6cm and 1.1cm, spanning a total of 3cm. Biopsy confirmed IDC with DCIS,  HER-2 negative by FISH (2+ by IHC), ER/PR negative, Ki67 80%.  Staging: T2N0 stage IIb clinical stage Breast MRI: 11/18/2018: 2.9 cm bilobed mass UOQ left breast  Treatment plan 1.Neoadjuvant chemotherapy with dose dense Adriamycin and Cytoxan x4 followed by Taxol and carboplatin 2.Breast conserving surgery with sentinel lymph node biopsy 2.Followed by adjuvant radiation -------------------------------------------------------------------------------------------------- Current treatment: Cycle 1 day 8 dose since he remains on Cytoxan Echocardiogram 11/26/2018: EF 60 to 65% Labs reviewed  Chemo toxicities: 1.  One episode of diarrhea for which she took Imodium Denied any nausea or vomiting 2.  Leukopenia: I will reduce the dosage of cycle 2 of chemo Monitoring closely for toxicities.  Return to clinic in 1 week for cycle 2    No orders of the defined types were placed in this encounter.  The patient has a good understanding of the overall plan. she agrees with it. she will call with any problems that may develop before the next visit here.  Nicholas Lose, MD 12/08/2018  Julious Oka Dorshimer am acting as scribe for Dr. Nicholas Lose.  I have reviewed the above documentation for accuracy and completeness, and I agree with the above.

## 2018-12-08 ENCOUNTER — Inpatient Hospital Stay: Payer: Medicare Other | Admitting: Hematology and Oncology

## 2018-12-08 ENCOUNTER — Inpatient Hospital Stay: Payer: Medicare Other

## 2018-12-08 ENCOUNTER — Other Ambulatory Visit: Payer: Self-pay

## 2018-12-08 DIAGNOSIS — Z7289 Other problems related to lifestyle: Secondary | ICD-10-CM | POA: Diagnosis not present

## 2018-12-08 DIAGNOSIS — C50412 Malignant neoplasm of upper-outer quadrant of left female breast: Secondary | ICD-10-CM

## 2018-12-08 DIAGNOSIS — Z808 Family history of malignant neoplasm of other organs or systems: Secondary | ICD-10-CM

## 2018-12-08 DIAGNOSIS — D701 Agranulocytosis secondary to cancer chemotherapy: Secondary | ICD-10-CM | POA: Diagnosis not present

## 2018-12-08 DIAGNOSIS — Z800 Family history of malignant neoplasm of digestive organs: Secondary | ICD-10-CM | POA: Diagnosis not present

## 2018-12-08 DIAGNOSIS — Z171 Estrogen receptor negative status [ER-]: Secondary | ICD-10-CM

## 2018-12-08 DIAGNOSIS — Z5111 Encounter for antineoplastic chemotherapy: Secondary | ICD-10-CM | POA: Diagnosis not present

## 2018-12-08 DIAGNOSIS — Z87891 Personal history of nicotine dependence: Secondary | ICD-10-CM

## 2018-12-08 DIAGNOSIS — Z803 Family history of malignant neoplasm of breast: Secondary | ICD-10-CM

## 2018-12-08 DIAGNOSIS — Z801 Family history of malignant neoplasm of trachea, bronchus and lung: Secondary | ICD-10-CM | POA: Diagnosis not present

## 2018-12-08 DIAGNOSIS — Z95828 Presence of other vascular implants and grafts: Secondary | ICD-10-CM

## 2018-12-08 LAB — CBC WITH DIFFERENTIAL (CANCER CENTER ONLY)
Abs Immature Granulocytes: 0.01 10*3/uL (ref 0.00–0.07)
Basophils Absolute: 0 10*3/uL (ref 0.0–0.1)
Basophils Relative: 1 %
Eosinophils Absolute: 0.1 10*3/uL (ref 0.0–0.5)
Eosinophils Relative: 6 %
HCT: 36.8 % (ref 36.0–46.0)
Hemoglobin: 12.2 g/dL (ref 12.0–15.0)
Immature Granulocytes: 1 %
Lymphocytes Relative: 78 %
Lymphs Abs: 0.7 10*3/uL (ref 0.7–4.0)
MCH: 32.1 pg (ref 26.0–34.0)
MCHC: 33.2 g/dL (ref 30.0–36.0)
MCV: 96.8 fL (ref 80.0–100.0)
Monocytes Absolute: 0.1 10*3/uL (ref 0.1–1.0)
Monocytes Relative: 8 %
Neutro Abs: 0.1 10*3/uL — ABNORMAL LOW (ref 1.7–7.7)
Neutrophils Relative %: 6 %
Platelet Count: 109 10*3/uL — ABNORMAL LOW (ref 150–400)
RBC: 3.8 MIL/uL — ABNORMAL LOW (ref 3.87–5.11)
RDW: 11.7 % (ref 11.5–15.5)
WBC Count: 0.9 10*3/uL — CL (ref 4.0–10.5)
nRBC: 0 % (ref 0.0–0.2)

## 2018-12-08 LAB — CMP (CANCER CENTER ONLY)
ALT: 12 U/L (ref 0–44)
AST: 13 U/L — ABNORMAL LOW (ref 15–41)
Albumin: 3.9 g/dL (ref 3.5–5.0)
Alkaline Phosphatase: 42 U/L (ref 38–126)
Anion gap: 11 (ref 5–15)
BUN: 21 mg/dL (ref 8–23)
CO2: 25 mmol/L (ref 22–32)
Calcium: 8.2 mg/dL — ABNORMAL LOW (ref 8.9–10.3)
Chloride: 99 mmol/L (ref 98–111)
Creatinine: 1.13 mg/dL — ABNORMAL HIGH (ref 0.44–1.00)
GFR, Est AFR Am: 55 mL/min — ABNORMAL LOW (ref >60)
GFR, Estimated: 48 mL/min — ABNORMAL LOW (ref >60)
Glucose, Bld: 112 mg/dL — ABNORMAL HIGH (ref 70–99)
Potassium: 4.5 mmol/L (ref 3.5–5.1)
Sodium: 135 mmol/L (ref 135–145)
Total Bilirubin: 1.3 mg/dL — ABNORMAL HIGH (ref 0.3–1.2)
Total Protein: 7 g/dL (ref 6.5–8.1)

## 2018-12-08 MED ORDER — SODIUM CHLORIDE 0.9% FLUSH
10.0000 mL | Freq: Once | INTRAVENOUS | Status: AC
  Administered 2018-12-08: 10 mL
  Filled 2018-12-08: qty 10

## 2018-12-08 MED ORDER — HEPARIN SOD (PORK) LOCK FLUSH 100 UNIT/ML IV SOLN
500.0000 [IU] | Freq: Once | INTRAVENOUS | Status: AC
  Administered 2018-12-08: 500 [IU]
  Filled 2018-12-08: qty 5

## 2018-12-10 NOTE — Assessment & Plan Note (Addendum)
11/08/2018: Routine screening mammogram detected 2 masses in the left breast, measuring 1.6cm and 1.1cm, spanning a total of 3cm. Biopsy confirmed IDC with DCIS, HER-2 negative by FISH (2+ by IHC), ER/PR negative, Ki67 80%.  Staging: T2N0 stage IIb clinical stage Breast MRI: 11/18/2018: 2.9 cm bilobed mass UOQ left breast  Treatment plan 1.Neoadjuvant chemotherapy with dose dense Adriamycin and Cytoxan x4 followed by Taxol and carboplatin 2.Breast conserving surgery with sentinel lymph node biopsy 2.Followed by adjuvant radiation -------------------------------------------------------------------------------------------------- Current treatment: Cycle 2 dose Adriamycin and Cytoxan Echocardiogram 11/26/2018: EF 60 to 65% Labs reviewed  Chemo toxicities: 1.  One episode of diarrhea for which she took Imodium Denied any nausea or vomiting 2.  Leukopenia: Reduced the dosage of cycle 2 of chemo Monitoring closely for toxicities.  Return to clinic in 2 weeks for cycle 3

## 2018-12-14 NOTE — Progress Notes (Signed)
Patient Care Team: [name], MD as PCP - General (Internal Medicine) Satira Sark, MD as PCP - Cardiology (Cardiology)  DIAGNOSIS:    ICD-10-CM   1. Malignant neoplasm of upper-outer quadrant of left breast in female, estrogen receptor negative ([place])  C50.412    Z17.1     SUMMARY OF ONCOLOGIC HISTORY: Oncology History  Malignant neoplasm of upper-outer quadrant of left breast in female, estrogen receptor negative ([place])  11/08/2018 Initial Diagnosis   Routine screening mammogram detected 2 masses in the left breast, measuring 1.6cm and 1.1cm, spanning a total of 3cm. Biopsy confirmed IDC with DCIS, HER-2 negative by FISH (2+ by IHC), ER/PR negative, Ki67 80%.    11/18/2018 Breast MRI   2.9 centimeter bilobed mass in the UOQ of the left breast, consistent with known malignancy. 0.5 centimeter satellite nodule is 0.5 centimeters below this mass.   11/22/2018 Cancer Staging   Staging form: Breast, AJCC 8th Edition - Clinical stage from 11/22/2018: Stage IIB (cT2, cN0, cM0, G3, ER-, PR-, HER2-) - Signed by Nicholas Lose, MD on 11/22/2018   11/27/2018 Genetic Testing   Patient had genetic testing done for a personal and family history of breast cancer.  Results were negative on the [facility] Breast cancer STAT gene panel. The STAT Breast cancer panel offered by [facility] includes sequencing and rearrangement analysis for the following 7 genes:  BRCA1, BRCA2, CDH1, PALB2, PTEN, STK11 and TP53.  The report date is 11/27/2018.   12/01/2018 -  Chemotherapy   The patient had DOXOrubicin (ADRIAMYCIN) chemo injection 114 mg, 60 mg/m2 = 114 mg, Intravenous,  Once, 1 of 4 cycles Dose modification: 50 mg/m2 (original dose 60 mg/m2, Cycle 2, Reason: Dose not tolerated) Administration: 114 mg (12/01/2018) palonosetron (ALOXI) injection 0.25 mg, 0.25 mg, Intravenous,  Once, 1 of 8 cycles Administration: 0.25 mg (12/01/2018) pegfilgrastim-jmdb (FULPHILA) injection 6 mg, 6 mg, Subcutaneous,  Once, 1 of 4  cycles Administration: 6 mg (12/03/2018) CARBOplatin (PARAPLATIN) 480 mg in sodium chloride 0.9 % 250 mL chemo infusion, 480 mg (100 % of original dose 478.8 mg), Intravenous,  Once, 0 of 4 cycles Dose modification:   (original dose 478.8 mg, Cycle 5) cyclophosphamide (CYTOXAN) 1,140 mg in sodium chloride 0.9 % 250 mL chemo infusion, 600 mg/m2 = 1,140 mg, Intravenous,  Once, 1 of 4 cycles Dose modification: 500 mg/m2 (original dose 600 mg/m2, Cycle 2, Reason: Dose not tolerated) Administration: 1,140 mg (12/01/2018) PACLitaxel (TAXOL) 150 mg in sodium chloride 0.9 % 250 mL chemo infusion (</= 12m/m2), 80 mg/m2 = 150 mg, Intravenous,  Once, 0 of 4 cycles fosaprepitant (EMEND) 150 mg, dexamethasone (DECADRON) 12 mg in sodium chloride 0.9 % 145 mL IVPB, , Intravenous,  Once, 1 of 8 cycles Administration:  (12/01/2018)  for chemotherapy treatment.      CHIEF COMPLIANT: Cycle 2 Adriamycin and Cytoxan  INTERVAL HISTORY: Carol NILEis a 73y.o. with above-mentioned history of left breast cancer currently on neoadjuvant chemotherapy with dose dense Adriamycin and Cytoxan. She presents to the clinic todayfor cycle 2.  She tolerated cycle 1 of chemo extremely well.  Denied any nausea vomiting.  She had one episode of diarrhea which responded to Imodium.  She had significant leukopenia on cycle 1 day 8 and we reduced the dosage of today's chemotherapy.  REVIEW OF SYSTEMS:   Constitutional: Denies fevers, chills or abnormal weight loss Eyes: Denies blurriness of vision Ears, nose, mouth, throat, and face: Denies mucositis or sore throat Respiratory: Denies cough, dyspnea or  wheezes Cardiovascular: Denies palpitation, chest discomfort Gastrointestinal: Denies nausea, heartburn or change in bowel habits Skin: Denies abnormal skin rashes Lymphatics: Denies new lymphadenopathy or easy bruising Neurological: Denies numbness, tingling or new weaknesses Behavioral/Psych: Mood is stable, no new changes   Extremities: No lower extremity edema Breast: denies any pain or lumps or nodules in either breasts All other systems were reviewed with the patient and are negative.  I have reviewed the past medical history, past surgical history, social history and family history with the patient and they are unchanged from previous note.  ALLERGIES:  is allergic to alpha-gal.  MEDICATIONS:  Current Outpatient Medications  Medication Sig Dispense Refill  . atorvastatin (LIPITOR) 20 MG tablet Take 20 mg by mouth See admin instructions. Takes in the evening on Tuesdays only    . Biotin 5000 MCG TABS Take 5,000 mcg by mouth daily. 30 tablet   . cholecalciferol (VITAMIN D3) 25 MCG (1000 UT) tablet Take 2,000 Units by mouth daily.    Marland Kitchen dexamethasone (DECADRON) 4 MG tablet Take 1 tablet day after chemo and 1 tablet 2 days after chemo with food 8 tablet 0  . ezetimibe (ZETIA) 10 MG tablet Take 10 mg by mouth at bedtime.    . finasteride (PROSCAR) 5 MG tablet Take 2.5 mg by mouth at bedtime.    Marland Kitchen levothyroxine (SYNTHROID) 88 MCG tablet Take 88 mcg by mouth daily before breakfast.    . lidocaine-prilocaine (EMLA) cream Apply to affected area once 30 g 3  . LORazepam (ATIVAN) 0.5 MG tablet Take 1 tablet (0.5 mg total) by mouth every 6 (six) hours as needed for sleep. 30 tablet 0  . Methylcellulose, Laxative, (CITRUCEL) 500 MG TABS Take 500 mg by mouth daily.    . ondansetron (ZOFRAN) 8 MG tablet Take 1 tablet (8 mg total) by mouth 2 (two) times daily as needed. Start on the third day after chemotherapy. 30 tablet 1  . pantoprazole (PROTONIX) 40 MG tablet Take 40 mg by mouth 2 (two) times daily before a meal.    . Polyethyl Glycol-Propyl Glycol (SYSTANE) 0.4-0.3 % SOLN Place 1 drop into both eyes at bedtime as needed (dry eyes).    . prochlorperazine (COMPAZINE) 10 MG tablet Take 1 tablet (10 mg total) by mouth every 6 (six) hours as needed (Nausea or vomiting). 30 tablet 1  . telmisartan (MICARDIS) 40 MG tablet  Take 40 mg by mouth daily.    . traMADol (ULTRAM) 50 MG tablet Take 2 tablets (100 mg total) by mouth every 6 (six) hours as needed. 5 tablet 0   No current facility-administered medications for this visit.     PHYSICAL EXAMINATION: ECOG PERFORMANCE STATUS: 1 - Symptomatic but completely ambulatory  There were no vitals filed for this visit. There were no vitals filed for this visit.  GENERAL: alert, no distress and comfortable SKIN: skin color, texture, turgor are normal, no rashes or significant lesions EYES: normal, Conjunctiva are pink and non-injected, sclera clear OROPHARYNX: no exudate, no erythema and lips, buccal mucosa, and tongue normal  NECK: supple, thyroid normal size, non-tender, without nodularity LYMPH: no palpable lymphadenopathy in the cervical, axillary or inguinal LUNGS: clear to auscultation and percussion with normal breathing effort HEART: regular rate & rhythm and no murmurs and no lower extremity edema ABDOMEN: abdomen soft, non-tender and normal bowel sounds MUSCULOSKELETAL: no cyanosis of digits and no clubbing  NEURO: alert & oriented x 3 with fluent speech, no focal motor/sensory deficits EXTREMITIES: No lower extremity edema  LABORATORY DATA:  I have reviewed the data as listed CMP Latest Ref Rng & Units 12/08/2018 12/01/2018 11/26/2018  Glucose 70 - 99 mg/dL 112(H) 130(H) 86  BUN 8 - 23 mg/dL 21 25(H) 23  Creatinine 0.44 - 1.00 mg/dL 1.13(H) 1.13(H) 1.22(H)  Sodium 135 - 145 mmol/L 135 140 139  Potassium 3.5 - 5.1 mmol/L 4.5 3.7 4.4  Chloride 98 - 111 mmol/L 99 103 105  CO2 22 - 32 mmol/L 25 25 24   Calcium 8.9 - 10.3 mg/dL 8.2(L) 8.5(L) 9.0  Total Protein 6.5 - 8.1 g/dL 7.0 6.8 -  Total Bilirubin 0.3 - 1.2 mg/dL 1.3(H) 0.6 -  Alkaline Phos 38 - 126 U/L 42 55 -  AST 15 - 41 U/L 13(L) 17 -  ALT 0 - 44 U/L 12 15 -    Lab Results  Component Value Date   WBC 0.9 (LL) 12/08/2018   HGB 12.2 12/08/2018   HCT 36.8 12/08/2018   MCV 96.8 12/08/2018    PLT 109 (L) 12/08/2018   NEUTROABS 0.1 (L) 12/08/2018    ASSESSMENT & PLAN:  Malignant neoplasm of upper-outer quadrant of left breast in female, estrogen receptor negative ([place]) 11/08/2018: Routine screening mammogram detected 2 masses in the left breast, measuring 1.6cm and 1.1cm, spanning a total of 3cm. Biopsy confirmed IDC with DCIS, HER-2 negative by FISH (2+ by IHC), ER/PR negative, Ki67 80%.  Staging: T2N0 stage IIb clinical stage Breast MRI: 11/18/2018: 2.9 cm bilobed mass UOQ left breast  Treatment plan 1.Neoadjuvant chemotherapy with dose dense Adriamycin and Cytoxan x4 followed by Taxol and carboplatin 2.Breast conserving surgery with sentinel lymph node biopsy 2.Followed by adjuvant radiation -------------------------------------------------------------------------------------------------- Current treatment: Cycle 2 dose Adriamycin and Cytoxan Echocardiogram 11/26/2018: EF 60 to 65% Labs reviewed  Chemo toxicities: 1.  One episode of diarrhea for which she took Imodium Denied any nausea or vomiting 2.  Leukopenia: Reduced the dosage of cycle 2 of chemo Monitoring closely for toxicities.  Return to clinic in 2 weeks for cycle 3    No orders of the defined types were placed in this encounter.  The patient has a good understanding of the overall plan. she agrees with it. she will call with any problems that may develop before the next visit here.  Nicholas Lose, MD 12/15/2018  Julious Oka Dorshimer am acting as scribe for Dr. Nicholas Lose.  I have reviewed the above documentation for accuracy and completeness, and I agree with the above.

## 2018-12-15 ENCOUNTER — Inpatient Hospital Stay (HOSPITAL_BASED_OUTPATIENT_CLINIC_OR_DEPARTMENT_OTHER): Payer: Medicare Other | Admitting: Hematology and Oncology

## 2018-12-15 ENCOUNTER — Inpatient Hospital Stay: Payer: Medicare Other

## 2018-12-15 ENCOUNTER — Other Ambulatory Visit: Payer: Self-pay

## 2018-12-15 DIAGNOSIS — C50412 Malignant neoplasm of upper-outer quadrant of left female breast: Secondary | ICD-10-CM

## 2018-12-15 DIAGNOSIS — Z7289 Other problems related to lifestyle: Secondary | ICD-10-CM | POA: Diagnosis not present

## 2018-12-15 DIAGNOSIS — Z171 Estrogen receptor negative status [ER-]: Secondary | ICD-10-CM

## 2018-12-15 DIAGNOSIS — Z803 Family history of malignant neoplasm of breast: Secondary | ICD-10-CM | POA: Diagnosis not present

## 2018-12-15 DIAGNOSIS — Z808 Family history of malignant neoplasm of other organs or systems: Secondary | ICD-10-CM

## 2018-12-15 DIAGNOSIS — Z801 Family history of malignant neoplasm of trachea, bronchus and lung: Secondary | ICD-10-CM | POA: Diagnosis not present

## 2018-12-15 DIAGNOSIS — Z800 Family history of malignant neoplasm of digestive organs: Secondary | ICD-10-CM | POA: Diagnosis not present

## 2018-12-15 DIAGNOSIS — Z5111 Encounter for antineoplastic chemotherapy: Secondary | ICD-10-CM | POA: Diagnosis not present

## 2018-12-15 DIAGNOSIS — Z87891 Personal history of nicotine dependence: Secondary | ICD-10-CM | POA: Diagnosis not present

## 2018-12-15 DIAGNOSIS — D701 Agranulocytosis secondary to cancer chemotherapy: Secondary | ICD-10-CM

## 2018-12-15 DIAGNOSIS — Z95828 Presence of other vascular implants and grafts: Secondary | ICD-10-CM

## 2018-12-15 LAB — CMP (CANCER CENTER ONLY)
ALT: 11 U/L (ref 0–44)
AST: 12 U/L — ABNORMAL LOW (ref 15–41)
Albumin: 3.4 g/dL — ABNORMAL LOW (ref 3.5–5.0)
Alkaline Phosphatase: 57 U/L (ref 38–126)
Anion gap: 10 (ref 5–15)
BUN: 17 mg/dL (ref 8–23)
CO2: 26 mmol/L (ref 22–32)
Calcium: 8.8 mg/dL — ABNORMAL LOW (ref 8.9–10.3)
Chloride: 106 mmol/L (ref 98–111)
Creatinine: 1.11 mg/dL — ABNORMAL HIGH (ref 0.44–1.00)
GFR, Est AFR Am: 57 mL/min — ABNORMAL LOW (ref >60)
GFR, Estimated: 49 mL/min — ABNORMAL LOW (ref >60)
Glucose, Bld: 111 mg/dL — ABNORMAL HIGH (ref 70–99)
Potassium: 4.1 mmol/L (ref 3.5–5.1)
Sodium: 142 mmol/L (ref 135–145)
Total Bilirubin: 0.2 mg/dL — ABNORMAL LOW (ref 0.3–1.2)
Total Protein: 6.3 g/dL — ABNORMAL LOW (ref 6.5–8.1)

## 2018-12-15 LAB — CBC WITH DIFFERENTIAL (CANCER CENTER ONLY)
Abs Immature Granulocytes: 2.25 10*3/uL — ABNORMAL HIGH (ref 0.00–0.07)
Basophils Absolute: 0.1 10*3/uL (ref 0.0–0.1)
Basophils Relative: 1 %
Eosinophils Absolute: 0 10*3/uL (ref 0.0–0.5)
Eosinophils Relative: 0 %
HCT: 32.8 % — ABNORMAL LOW (ref 36.0–46.0)
Hemoglobin: 10.9 g/dL — ABNORMAL LOW (ref 12.0–15.0)
Immature Granulocytes: 17 %
Lymphocytes Relative: 12 %
Lymphs Abs: 1.6 10*3/uL (ref 0.7–4.0)
MCH: 32.5 pg (ref 26.0–34.0)
MCHC: 33.2 g/dL (ref 30.0–36.0)
MCV: 97.9 fL (ref 80.0–100.0)
Monocytes Absolute: 1 10*3/uL (ref 0.1–1.0)
Monocytes Relative: 8 %
Neutro Abs: 8.2 10*3/uL — ABNORMAL HIGH (ref 1.7–7.7)
Neutrophils Relative %: 62 %
Platelet Count: 237 10*3/uL (ref 150–400)
RBC: 3.35 MIL/uL — ABNORMAL LOW (ref 3.87–5.11)
RDW: 11.9 % (ref 11.5–15.5)
WBC Count: 13.1 10*3/uL — ABNORMAL HIGH (ref 4.0–10.5)
nRBC: 0.2 % (ref 0.0–0.2)

## 2018-12-15 MED ORDER — SODIUM CHLORIDE 0.9% FLUSH
10.0000 mL | Freq: Once | INTRAVENOUS | Status: AC
  Administered 2018-12-15: 09:00:00 10 mL
  Filled 2018-12-15: qty 10

## 2018-12-15 MED ORDER — PALONOSETRON HCL INJECTION 0.25 MG/5ML
0.2500 mg | Freq: Once | INTRAVENOUS | Status: AC
  Administered 2018-12-15: 0.25 mg via INTRAVENOUS

## 2018-12-15 MED ORDER — PALONOSETRON HCL INJECTION 0.25 MG/5ML
INTRAVENOUS | Status: AC
  Filled 2018-12-15: qty 5

## 2018-12-15 MED ORDER — SODIUM CHLORIDE 0.9 % IV SOLN
500.0000 mg/m2 | Freq: Once | INTRAVENOUS | Status: AC
  Administered 2018-12-15: 960 mg via INTRAVENOUS
  Filled 2018-12-15: qty 48

## 2018-12-15 MED ORDER — SODIUM CHLORIDE 0.9% FLUSH
10.0000 mL | INTRAVENOUS | Status: DC | PRN
  Administered 2018-12-15: 12:00:00 10 mL
  Filled 2018-12-15: qty 10

## 2018-12-15 MED ORDER — SODIUM CHLORIDE 0.9 % IV SOLN
Freq: Once | INTRAVENOUS | Status: AC
  Administered 2018-12-15: 10:00:00 via INTRAVENOUS
  Filled 2018-12-15: qty 5

## 2018-12-15 MED ORDER — HEPARIN SOD (PORK) LOCK FLUSH 100 UNIT/ML IV SOLN
500.0000 [IU] | Freq: Once | INTRAVENOUS | Status: AC | PRN
  Administered 2018-12-15: 12:00:00 500 [IU]
  Filled 2018-12-15: qty 5

## 2018-12-15 MED ORDER — SODIUM CHLORIDE 0.9 % IV SOLN
Freq: Once | INTRAVENOUS | Status: AC
  Administered 2018-12-15: 10:00:00 via INTRAVENOUS
  Filled 2018-12-15: qty 250

## 2018-12-15 MED ORDER — DOXORUBICIN HCL CHEMO IV INJECTION 2 MG/ML
50.0000 mg/m2 | Freq: Once | INTRAVENOUS | Status: AC
  Administered 2018-12-15: 96 mg via INTRAVENOUS
  Filled 2018-12-15: qty 48

## 2018-12-15 NOTE — Patient Instructions (Signed)

## 2018-12-15 NOTE — Patient Instructions (Signed)
[facility] Discharge Instructions for Patients Receiving Chemotherapy  Today you received the following chemotherapy agents Adriamycin and cytoxan  To help prevent nausea and vomiting after your treatment, we encourage you to take your nausea medication as directed   If you develop nausea and vomiting that is not controlled by your nausea medication, call the clinic.   BELOW ARE SYMPTOMS THAT SHOULD BE REPORTED IMMEDIATELY:  *FEVER GREATER THAN 100.5 F  *CHILLS WITH OR WITHOUT FEVER  NAUSEA AND VOMITING THAT IS NOT CONTROLLED WITH YOUR NAUSEA MEDICATION  *UNUSUAL SHORTNESS OF BREATH  *UNUSUAL BRUISING OR BLEEDING  TENDERNESS IN MOUTH AND THROAT WITH OR WITHOUT PRESENCE OF ULCERS  *URINARY PROBLEMS  *BOWEL PROBLEMS  UNUSUAL RASH Items with * indicate a potential emergency and should be followed up as soon as possible.  Feel free to call the clinic should you have any questions or concerns. The clinic phone number is [phone].  Please show the [facility] at check-in to the Emergency Department and triage nurse.

## 2018-12-17 ENCOUNTER — Other Ambulatory Visit: Payer: Self-pay

## 2018-12-17 ENCOUNTER — Inpatient Hospital Stay: Payer: Medicare Other

## 2018-12-17 VITALS — BP 118/97 | HR 80 | Temp 98.5°F | Resp 18

## 2018-12-17 DIAGNOSIS — Z808 Family history of malignant neoplasm of other organs or systems: Secondary | ICD-10-CM | POA: Diagnosis not present

## 2018-12-17 DIAGNOSIS — Z5111 Encounter for antineoplastic chemotherapy: Secondary | ICD-10-CM | POA: Diagnosis not present

## 2018-12-17 DIAGNOSIS — Z801 Family history of malignant neoplasm of trachea, bronchus and lung: Secondary | ICD-10-CM | POA: Diagnosis not present

## 2018-12-17 DIAGNOSIS — D701 Agranulocytosis secondary to cancer chemotherapy: Secondary | ICD-10-CM | POA: Diagnosis not present

## 2018-12-17 DIAGNOSIS — C50412 Malignant neoplasm of upper-outer quadrant of left female breast: Secondary | ICD-10-CM | POA: Diagnosis not present

## 2018-12-17 DIAGNOSIS — Z87891 Personal history of nicotine dependence: Secondary | ICD-10-CM | POA: Diagnosis not present

## 2018-12-17 DIAGNOSIS — Z171 Estrogen receptor negative status [ER-]: Secondary | ICD-10-CM

## 2018-12-17 DIAGNOSIS — Z800 Family history of malignant neoplasm of digestive organs: Secondary | ICD-10-CM | POA: Diagnosis not present

## 2018-12-17 DIAGNOSIS — Z803 Family history of malignant neoplasm of breast: Secondary | ICD-10-CM | POA: Diagnosis not present

## 2018-12-17 DIAGNOSIS — Z7289 Other problems related to lifestyle: Secondary | ICD-10-CM | POA: Diagnosis not present

## 2018-12-17 MED ORDER — PEGFILGRASTIM-JMDB 6 MG/0.6ML ~~LOC~~ SOSY
6.0000 mg | PREFILLED_SYRINGE | Freq: Once | SUBCUTANEOUS | Status: AC
  Administered 2018-12-17: 11:00:00 6 mg via SUBCUTANEOUS
  Filled 2018-12-17: qty 0.6

## 2018-12-17 NOTE — Patient Instructions (Signed)

## 2018-12-22 NOTE — Assessment & Plan Note (Signed)
11/08/2018: Routine screening mammogram detected 2 masses in the left breast, measuring 1.6cm and 1.1cm, spanning a total of 3cm. Biopsy confirmed IDC with DCIS, HER-2 negative by FISH (2+ by IHC), ER/PR negative, Ki67 80%.  Staging: T2N0 stage IIb clinical stage Breast MRI: 11/18/2018: 2.9 cm bilobed mass UOQ left breast  Treatment plan 1.Neoadjuvant chemotherapy with dose dense Adriamycin and Cytoxan x4 followed by Taxol and carboplatin 2.Breast conserving surgery with sentinel lymph node biopsy 2.Followed by adjuvant radiation -------------------------------------------------------------------------------------------------- Current treatment: Cycle 3dose Adriamycin and Cytoxan Echocardiogram 11/26/2018: EF 60 to 65% Labs reviewed  Chemo toxicities: 1.One episode of diarrhea for which she took Imodium Denied any nausea or vomiting 2.Leukopenia: Reduced the dosage of cycle 2 of chemo Monitoring closely for toxicities.  Return to clinic in 2 weeks for cycle 4

## 2018-12-28 NOTE — Progress Notes (Signed)
Patient Care Team: [name], MD as PCP - General (Internal Medicine) Satira Sark, MD as PCP - Cardiology (Cardiology)  DIAGNOSIS:    ICD-10-CM   1. Malignant neoplasm of upper-outer quadrant of left breast in female, estrogen receptor negative ([place])  C50.412    Z17.1     SUMMARY OF ONCOLOGIC HISTORY: Oncology History  Malignant neoplasm of upper-outer quadrant of left breast in female, estrogen receptor negative ([place])  11/08/2018 Initial Diagnosis   Routine screening mammogram detected 2 masses in the left breast, measuring 1.6cm and 1.1cm, spanning a total of 3cm. Biopsy confirmed IDC with DCIS, HER-2 negative by FISH (2+ by IHC), ER/PR negative, Ki67 80%.    11/18/2018 Breast MRI   2.9 centimeter bilobed mass in the UOQ of the left breast, consistent with known malignancy. 0.5 centimeter satellite nodule is 0.5 centimeters below this mass.   11/22/2018 Cancer Staging   Staging form: Breast, AJCC 8th Edition - Clinical stage from 11/22/2018: Stage IIB (cT2, cN0, cM0, G3, ER-, PR-, HER2-) - Signed by Nicholas Lose, MD on 11/22/2018   11/27/2018 Genetic Testing   Patient had genetic testing done for a personal and family history of breast cancer.  Results were negative on the [facility] Breast cancer STAT gene panel. The STAT Breast cancer panel offered by [facility] includes sequencing and rearrangement analysis for the following 7 genes:  BRCA1, BRCA2, CDH1, PALB2, PTEN, STK11 and TP53.  The report date is 11/27/2018.   12/01/2018 -  Chemotherapy   The patient had DOXOrubicin (ADRIAMYCIN) chemo injection 114 mg, 60 mg/m2 = 114 mg, Intravenous,  Once, 2 of 4 cycles Dose modification: 50 mg/m2 (original dose 60 mg/m2, Cycle 2, Reason: Dose not tolerated) Administration: 114 mg (12/01/2018), 96 mg (12/15/2018) palonosetron (ALOXI) injection 0.25 mg, 0.25 mg, Intravenous,  Once, 2 of 8 cycles Administration: 0.25 mg (12/01/2018), 0.25 mg (12/15/2018) pegfilgrastim-jmdb (FULPHILA) injection 6  mg, 6 mg, Subcutaneous,  Once, 2 of 4 cycles Administration: 6 mg (12/03/2018), 6 mg (12/17/2018) CARBOplatin (PARAPLATIN) 480 mg in sodium chloride 0.9 % 250 mL chemo infusion, 480 mg (100 % of original dose 478.8 mg), Intravenous,  Once, 0 of 4 cycles Dose modification:   (original dose 478.8 mg, Cycle 5) cyclophosphamide (CYTOXAN) 1,140 mg in sodium chloride 0.9 % 250 mL chemo infusion, 600 mg/m2 = 1,140 mg, Intravenous,  Once, 2 of 4 cycles Dose modification: 500 mg/m2 (original dose 600 mg/m2, Cycle 2, Reason: Dose not tolerated) Administration: 1,140 mg (12/01/2018), 960 mg (12/15/2018) PACLitaxel (TAXOL) 150 mg in sodium chloride 0.9 % 250 mL chemo infusion (</= 19m/m2), 80 mg/m2 = 150 mg, Intravenous,  Once, 0 of 4 cycles fosaprepitant (EMEND) 150 mg, dexamethasone (DECADRON) 12 mg in sodium chloride 0.9 % 145 mL IVPB, , Intravenous,  Once, 2 of 8 cycles Administration:  (12/01/2018),  (12/15/2018)  for chemotherapy treatment.      CHIEF COMPLIANT: Cycle 3Adriamycin and Cytoxan  INTERVAL HISTORY: Carol CHEEVERis a 73y.o. with above-mentioned history of left breast cancer currently on neoadjuvant chemotherapy with dose dense Adriamycin and Cytoxan. She presents to the clinic todayforcycle 3.  She has occasional constipation.  Her biggest complaint today is fatigue that is constant and appears to be worsened after the first cycle.  She has not taken dexamethasone with this round of chemo.  She has excellent appetite and taste.  REVIEW OF SYSTEMS:   Constitutional: Denies fevers, chills or abnormal weight loss Eyes: Denies blurriness of vision Ears, nose, mouth, throat, and  face: Denies mucositis or sore throat Respiratory: Denies cough, dyspnea or wheezes Cardiovascular: Denies palpitation, chest discomfort Gastrointestinal: Denies nausea, heartburn or change in bowel habits Skin: Denies abnormal skin rashes Lymphatics: Denies new lymphadenopathy or easy bruising Neurological:  Denies numbness, tingling or new weaknesses Behavioral/Psych: Mood is stable, no new changes  Extremities: No lower extremity edema Breast: denies any pain or lumps or nodules in either breasts All other systems were reviewed with the patient and are negative.  I have reviewed the past medical history, past surgical history, social history and family history with the patient and they are unchanged from previous note.  ALLERGIES:  is allergic to alpha-gal.  MEDICATIONS:  Current Outpatient Medications  Medication Sig Dispense Refill   atorvastatin (LIPITOR) 20 MG tablet Take 20 mg by mouth See admin instructions. Takes in the evening on Tuesdays only     Biotin 5000 MCG TABS Take 5,000 mcg by mouth daily. 30 tablet    cholecalciferol (VITAMIN D3) 25 MCG (1000 UT) tablet Take 2,000 Units by mouth daily.     dexamethasone (DECADRON) 4 MG tablet Take 1 tablet day after chemo and 1 tablet 2 days after chemo with food 8 tablet 0   ezetimibe (ZETIA) 10 MG tablet Take 10 mg by mouth at bedtime.     finasteride (PROSCAR) 5 MG tablet Take 2.5 mg by mouth at bedtime.     levothyroxine (SYNTHROID) 88 MCG tablet Take 88 mcg by mouth daily before breakfast.     lidocaine-prilocaine (EMLA) cream Apply to affected area once 30 g 3   LORazepam (ATIVAN) 0.5 MG tablet Take 1 tablet (0.5 mg total) by mouth every 6 (six) hours as needed for sleep. 30 tablet 0   Methylcellulose, Laxative, (CITRUCEL) 500 MG TABS Take 500 mg by mouth daily.     ondansetron (ZOFRAN) 8 MG tablet Take 1 tablet (8 mg total) by mouth 2 (two) times daily as needed. Start on the third day after chemotherapy. 30 tablet 1   pantoprazole (PROTONIX) 40 MG tablet Take 40 mg by mouth 2 (two) times daily before a meal.     Polyethyl Glycol-Propyl Glycol (SYSTANE) 0.4-0.3 % SOLN Place 1 drop into both eyes at bedtime as needed (dry eyes).     prochlorperazine (COMPAZINE) 10 MG tablet Take 1 tablet (10 mg total) by mouth every 6  (six) hours as needed (Nausea or vomiting). 30 tablet 1   telmisartan (MICARDIS) 40 MG tablet Take 40 mg by mouth daily.     traMADol (ULTRAM) 50 MG tablet Take 2 tablets (100 mg total) by mouth every 6 (six) hours as needed. 5 tablet 0   No current facility-administered medications for this visit.     PHYSICAL EXAMINATION: ECOG PERFORMANCE STATUS: 1 - Symptomatic but completely ambulatory  Vitals:   12/29/18 0837  BP: (!) 140/55  Pulse: 95  Resp: 17  Temp: 98 F (36.7 C)  SpO2: 92%   Filed Weights   12/29/18 0837  Weight: 166 lb 4.8 oz (75.4 kg)    GENERAL: alert, no distress and comfortable SKIN: skin color, texture, turgor are normal, no rashes or significant lesions EYES: normal, Conjunctiva are pink and non-injected, sclera clear OROPHARYNX: no exudate, no erythema and lips, buccal mucosa, and tongue normal  NECK: supple, thyroid normal size, non-tender, without nodularity LYMPH: no palpable lymphadenopathy in the cervical, axillary or inguinal LUNGS: clear to auscultation and percussion with normal breathing effort HEART: regular rate & rhythm and no murmurs and no lower  extremity edema ABDOMEN: abdomen soft, non-tender and normal bowel sounds MUSCULOSKELETAL: no cyanosis of digits and no clubbing  NEURO: alert & oriented x 3 with fluent speech, no focal motor/sensory deficits EXTREMITIES: No lower extremity edema  LABORATORY DATA:  I have reviewed the data as listed CMP Latest Ref Rng & Units 12/15/2018 12/08/2018 12/01/2018  Glucose 70 - 99 mg/dL 111(H) 112(H) 130(H)  BUN 8 - 23 mg/dL 17 21 25(H)  Creatinine 0.44 - 1.00 mg/dL 1.11(H) 1.13(H) 1.13(H)  Sodium 135 - 145 mmol/L 142 135 140  Potassium 3.5 - 5.1 mmol/L 4.1 4.5 3.7  Chloride 98 - 111 mmol/L 106 99 103  CO2 22 - 32 mmol/L _0 Calcium 8.9 - 10.3 mg/dL 8.8(L) 8.2(L) 8.5(L)  Total Protein 6.5 - 8.1 g/dL 6.3(L) 7.0 6.8  Total Bilirubin 0.3 - 1.2 mg/dL 0.2(L) 1.3(H) 0.6  Alkaline Phos 38 - 126 U/L 57  42 55  AST 15 - 41 U/L 12(L) 13(L) 17  ALT 0 - 44 U/L _1 Lab Results  Component Value Date   WBC 13.1 (H) 12/15/2018   HGB 10.9 (L) 12/15/2018   HCT 32.8 (L) 12/15/2018   MCV 97.9 12/15/2018   PLT 237 12/15/2018   NEUTROABS 8.2 (H) 12/15/2018    ASSESSMENT & PLAN:  Malignant neoplasm of upper-outer quadrant of left breast in female, estrogen receptor negative ([place]) 11/08/2018: Routine screening mammogram detected 2 masses in the left breast, measuring 1.6cm and 1.1cm, spanning a total of 3cm. Biopsy confirmed IDC with DCIS, HER-2 negative by FISH (2+ by IHC), ER/PR negative, Ki67 80%.  Staging: T2N0 stage IIb clinical stage Breast MRI: 11/18/2018: 2.9 cm bilobed mass UOQ left breast  Treatment plan 1.Neoadjuvant chemotherapy with dose dense Adriamycin and Cytoxan x4 followed by Taxol and carboplatin 2.Breast conserving surgery with sentinel lymph node biopsy 2.Followed by adjuvant radiation -------------------------------------------------------------------------------------------------- Current treatment: Cycle 3dose Adriamycin and Cytoxan Echocardiogram 11/26/2018: EF 60 to 65% Labs reviewed  Chemo toxicities: 1.Occasional constipation Denied any nausea or vomiting 2.Leukopenia: Reduced the dosage of cycle 2 of chemo, currently leukocytosis because of G-CSF effect 3.  Chemo induced anemia: Hemoglobin stable 10.9. 4.  Severe fatigue: Worsened after cycle 2.  Monitoring closely for toxicities.  Return to clinic in 2 weeks for cycle 4  No orders of the defined types were placed in this encounter.  The patient has a good understanding of the overall plan. she agrees with it. she will call with any problems that may develop before the next visit here.  Nicholas Lose, MD 12/29/2018  Julious Oka Dorshimer am acting as scribe for Dr. Nicholas Lose.  I have reviewed the above documentation for accuracy and completeness, and I agree with the above.

## 2018-12-29 ENCOUNTER — Inpatient Hospital Stay: Payer: Medicare Other

## 2018-12-29 ENCOUNTER — Inpatient Hospital Stay: Payer: Medicare Other | Attending: Hematology and Oncology

## 2018-12-29 ENCOUNTER — Inpatient Hospital Stay (HOSPITAL_BASED_OUTPATIENT_CLINIC_OR_DEPARTMENT_OTHER): Payer: Medicare Other | Admitting: Hematology and Oncology

## 2018-12-29 ENCOUNTER — Telehealth: Payer: Self-pay | Admitting: Hematology and Oncology

## 2018-12-29 ENCOUNTER — Other Ambulatory Visit: Payer: Self-pay

## 2018-12-29 DIAGNOSIS — C50412 Malignant neoplasm of upper-outer quadrant of left female breast: Secondary | ICD-10-CM

## 2018-12-29 DIAGNOSIS — Z5111 Encounter for antineoplastic chemotherapy: Secondary | ICD-10-CM | POA: Diagnosis not present

## 2018-12-29 DIAGNOSIS — Z79899 Other long term (current) drug therapy: Secondary | ICD-10-CM | POA: Diagnosis not present

## 2018-12-29 DIAGNOSIS — Z171 Estrogen receptor negative status [ER-]: Secondary | ICD-10-CM

## 2018-12-29 DIAGNOSIS — R112 Nausea with vomiting, unspecified: Secondary | ICD-10-CM | POA: Diagnosis not present

## 2018-12-29 DIAGNOSIS — Z7689 Persons encountering health services in other specified circumstances: Secondary | ICD-10-CM | POA: Diagnosis not present

## 2018-12-29 DIAGNOSIS — Z95828 Presence of other vascular implants and grafts: Secondary | ICD-10-CM

## 2018-12-29 LAB — CMP (CANCER CENTER ONLY)
ALT: 9 U/L (ref 0–44)
AST: 12 U/L — ABNORMAL LOW (ref 15–41)
Albumin: 3.7 g/dL (ref 3.5–5.0)
Alkaline Phosphatase: 74 U/L (ref 38–126)
Anion gap: 13 (ref 5–15)
BUN: 16 mg/dL (ref 8–23)
CO2: 23 mmol/L (ref 22–32)
Calcium: 8.5 mg/dL — ABNORMAL LOW (ref 8.9–10.3)
Chloride: 107 mmol/L (ref 98–111)
Creatinine: 1.06 mg/dL — ABNORMAL HIGH (ref 0.44–1.00)
GFR, Est AFR Am: 60 mL/min — ABNORMAL LOW (ref >60)
GFR, Estimated: 52 mL/min — ABNORMAL LOW (ref >60)
Glucose, Bld: 125 mg/dL — ABNORMAL HIGH (ref 70–99)
Potassium: 3.7 mmol/L (ref 3.5–5.1)
Sodium: 143 mmol/L (ref 135–145)
Total Bilirubin: 0.3 mg/dL (ref 0.3–1.2)
Total Protein: 6.3 g/dL — ABNORMAL LOW (ref 6.5–8.1)

## 2018-12-29 LAB — CBC WITH DIFFERENTIAL (CANCER CENTER ONLY)
Abs Immature Granulocytes: 2.75 10*3/uL — ABNORMAL HIGH (ref 0.00–0.07)
Basophils Absolute: 0.1 10*3/uL (ref 0.0–0.1)
Basophils Relative: 1 %
Eosinophils Absolute: 0.1 10*3/uL (ref 0.0–0.5)
Eosinophils Relative: 0 %
HCT: 32.7 % — ABNORMAL LOW (ref 36.0–46.0)
Hemoglobin: 10.9 g/dL — ABNORMAL LOW (ref 12.0–15.0)
Immature Granulocytes: 15 %
Lymphocytes Relative: 7 %
Lymphs Abs: 1.2 10*3/uL (ref 0.7–4.0)
MCH: 32.5 pg (ref 26.0–34.0)
MCHC: 33.3 g/dL (ref 30.0–36.0)
MCV: 97.6 fL (ref 80.0–100.0)
Monocytes Absolute: 1.1 10*3/uL — ABNORMAL HIGH (ref 0.1–1.0)
Monocytes Relative: 6 %
Neutro Abs: 13.3 10*3/uL — ABNORMAL HIGH (ref 1.7–7.7)
Neutrophils Relative %: 71 %
Platelet Count: 154 10*3/uL (ref 150–400)
RBC: 3.35 MIL/uL — ABNORMAL LOW (ref 3.87–5.11)
RDW: 12.6 % (ref 11.5–15.5)
WBC Count: 18.5 10*3/uL — ABNORMAL HIGH (ref 4.0–10.5)
nRBC: 0.2 % (ref 0.0–0.2)

## 2018-12-29 MED ORDER — DOXORUBICIN HCL CHEMO IV INJECTION 2 MG/ML
50.0000 mg/m2 | Freq: Once | INTRAVENOUS | Status: AC
  Administered 2018-12-29: 96 mg via INTRAVENOUS
  Filled 2018-12-29: qty 48

## 2018-12-29 MED ORDER — HEPARIN SOD (PORK) LOCK FLUSH 100 UNIT/ML IV SOLN
500.0000 [IU] | Freq: Once | INTRAVENOUS | Status: AC | PRN
  Administered 2018-12-29: 500 [IU]
  Filled 2018-12-29: qty 5

## 2018-12-29 MED ORDER — SODIUM CHLORIDE 0.9% FLUSH
10.0000 mL | INTRAVENOUS | Status: DC | PRN
  Administered 2018-12-29: 10 mL
  Filled 2018-12-29: qty 10

## 2018-12-29 MED ORDER — PALONOSETRON HCL INJECTION 0.25 MG/5ML
0.2500 mg | Freq: Once | INTRAVENOUS | Status: AC
  Administered 2018-12-29: 09:00:00 0.25 mg via INTRAVENOUS

## 2018-12-29 MED ORDER — PALONOSETRON HCL INJECTION 0.25 MG/5ML
INTRAVENOUS | Status: AC
  Filled 2018-12-29: qty 5

## 2018-12-29 MED ORDER — SODIUM CHLORIDE 0.9 % IV SOLN
500.0000 mg/m2 | Freq: Once | INTRAVENOUS | Status: AC
  Administered 2018-12-29: 960 mg via INTRAVENOUS
  Filled 2018-12-29: qty 48

## 2018-12-29 MED ORDER — SODIUM CHLORIDE 0.9% FLUSH
10.0000 mL | Freq: Once | INTRAVENOUS | Status: AC
  Administered 2018-12-29: 10 mL
  Filled 2018-12-29: qty 10

## 2018-12-29 MED ORDER — SODIUM CHLORIDE 0.9 % IV SOLN
Freq: Once | INTRAVENOUS | Status: AC
  Administered 2018-12-29: 10:00:00 via INTRAVENOUS
  Filled 2018-12-29: qty 5

## 2018-12-29 MED ORDER — SODIUM CHLORIDE 0.9 % IV SOLN
Freq: Once | INTRAVENOUS | Status: AC
  Administered 2018-12-29: 09:00:00 via INTRAVENOUS
  Filled 2018-12-29: qty 250

## 2018-12-29 NOTE — Patient Instructions (Signed)
[facility] Discharge Instructions for Patients Receiving Chemotherapy  Today you received the following chemotherapy agents Adriamycin and cytoxan  To help prevent nausea and vomiting after your treatment, we encourage you to take your nausea medication as directed   If you develop nausea and vomiting that is not controlled by your nausea medication, call the clinic.   BELOW ARE SYMPTOMS THAT SHOULD BE REPORTED IMMEDIATELY:  *FEVER GREATER THAN 100.5 F  *CHILLS WITH OR WITHOUT FEVER  NAUSEA AND VOMITING THAT IS NOT CONTROLLED WITH YOUR NAUSEA MEDICATION  *UNUSUAL SHORTNESS OF BREATH  *UNUSUAL BRUISING OR BLEEDING  TENDERNESS IN MOUTH AND THROAT WITH OR WITHOUT PRESENCE OF ULCERS  *URINARY PROBLEMS  *BOWEL PROBLEMS  UNUSUAL RASH Items with * indicate a potential emergency and should be followed up as soon as possible.  Feel free to call the clinic should you have any questions or concerns. The clinic phone number is [phone].  Please show the [place] at check-in to the Emergency Department and triage nurse.

## 2018-12-29 NOTE — Telephone Encounter (Signed)
No new orders, scheduled messages or referrals during time of check out.

## 2018-12-31 ENCOUNTER — Other Ambulatory Visit: Payer: Self-pay

## 2018-12-31 ENCOUNTER — Inpatient Hospital Stay: Payer: Medicare Other

## 2018-12-31 VITALS — BP 104/63 | HR 97 | Temp 98.0°F | Resp 16

## 2018-12-31 DIAGNOSIS — C50412 Malignant neoplasm of upper-outer quadrant of left female breast: Secondary | ICD-10-CM | POA: Diagnosis not present

## 2018-12-31 DIAGNOSIS — Z5111 Encounter for antineoplastic chemotherapy: Secondary | ICD-10-CM | POA: Diagnosis not present

## 2018-12-31 DIAGNOSIS — Z171 Estrogen receptor negative status [ER-]: Secondary | ICD-10-CM

## 2018-12-31 DIAGNOSIS — Z79899 Other long term (current) drug therapy: Secondary | ICD-10-CM | POA: Diagnosis not present

## 2018-12-31 DIAGNOSIS — R112 Nausea with vomiting, unspecified: Secondary | ICD-10-CM | POA: Diagnosis not present

## 2018-12-31 DIAGNOSIS — Z7689 Persons encountering health services in other specified circumstances: Secondary | ICD-10-CM | POA: Diagnosis not present

## 2018-12-31 MED ORDER — PEGFILGRASTIM-JMDB 6 MG/0.6ML ~~LOC~~ SOSY
6.0000 mg | PREFILLED_SYRINGE | Freq: Once | SUBCUTANEOUS | Status: AC
  Administered 2018-12-31: 6 mg via SUBCUTANEOUS
  Filled 2018-12-31: qty 0.6

## 2018-12-31 NOTE — Patient Instructions (Signed)

## 2019-01-05 NOTE — Assessment & Plan Note (Signed)
11/08/2018: Routine screening mammogram detected 2 masses in the left breast, measuring 1.6cm and 1.1cm, spanning a total of 3cm. Biopsy confirmed IDC with DCIS, HER-2 negative by FISH (2+ by IHC), ER/PR negative, Ki67 80%.  Staging: T2N0 stage IIb clinical stage Breast MRI: 11/18/2018: 2.9 cm bilobed mass UOQ left breast  Treatment plan 1.Neoadjuvant chemotherapy with dose dense Adriamycin and Cytoxan x4 followed by Taxol and carboplatin 2.Breast conserving surgery with sentinel lymph node biopsy 2.Followed by adjuvant radiation -------------------------------------------------------------------------------------------------- Current treatment: Cycle4doseAdriamycin andCytoxan Echocardiogram 11/26/2018: EF 60 to 65% Labs reviewed  Chemo toxicities: 1.Occasional constipation Denied any nausea or vomiting 2.Leukopenia:Reducedthe dosage of cycle 2 of chemo, currently leukocytosis because of G-CSF effect 3.  Chemo induced anemia: Hemoglobin stable 10.9. 4.  Severe fatigue: Worsened after cycle 2.  Monitoring closely for toxicities.  Return to clinic in2 weeks for cycle 1 Taxol with carboplatin every 3 weeks

## 2019-01-11 NOTE — Progress Notes (Signed)
Patient Care Team: [name], MD as PCP - General (Internal Medicine) Satira Sark, MD as PCP - Cardiology (Cardiology)  DIAGNOSIS:    ICD-10-CM   1. Malignant neoplasm of upper-outer quadrant of left breast in female, estrogen receptor negative ([place])  C50.412    Z17.1     SUMMARY OF ONCOLOGIC HISTORY: Oncology History  Malignant neoplasm of upper-outer quadrant of left breast in female, estrogen receptor negative ([place])  11/08/2018 Initial Diagnosis   Routine screening mammogram detected 2 masses in the left breast, measuring 1.6cm and 1.1cm, spanning a total of 3cm. Biopsy confirmed IDC with DCIS, HER-2 negative by FISH (2+ by IHC), ER/PR negative, Ki67 80%.    11/18/2018 Breast MRI   2.9 centimeter bilobed mass in the UOQ of the left breast, consistent with known malignancy. 0.5 centimeter satellite nodule is 0.5 centimeters below this mass.   11/22/2018 Cancer Staging   Staging form: Breast, AJCC 8th Edition - Clinical stage from 11/22/2018: Stage IIB (cT2, cN0, cM0, G3, ER-, PR-, HER2-) - Signed by Nicholas Lose, MD on 11/22/2018   11/27/2018 Genetic Testing   Patient had genetic testing done for a personal and family history of breast cancer.  Results were negative on the [facility] Breast cancer STAT gene panel. The STAT Breast cancer panel offered by [facility] includes sequencing and rearrangement analysis for the following 7 genes:  BRCA1, BRCA2, CDH1, PALB2, PTEN, STK11 and TP53.  The report date is 11/27/2018.   12/01/2018 -  Chemotherapy   The patient had DOXOrubicin (ADRIAMYCIN) chemo injection 114 mg, 60 mg/m2 = 114 mg, Intravenous,  Once, 3 of 4 cycles Dose modification: 50 mg/m2 (original dose 60 mg/m2, Cycle 2, Reason: Dose not tolerated), 40 mg/m2 (original dose 60 mg/m2, Cycle 4, Reason: Dose not tolerated) Administration: 114 mg (12/01/2018), 96 mg (12/15/2018), 96 mg (12/29/2018) palonosetron (ALOXI) injection 0.25 mg, 0.25 mg, Intravenous,  Once, 3 of 8 cycles  Administration: 0.25 mg (12/01/2018), 0.25 mg (12/15/2018), 0.25 mg (12/29/2018) pegfilgrastim-jmdb (FULPHILA) injection 6 mg, 6 mg, Subcutaneous,  Once, 3 of 4 cycles Administration: 6 mg (12/03/2018), 6 mg (12/17/2018), 6 mg (12/31/2018) CARBOplatin (PARAPLATIN) 480 mg in sodium chloride 0.9 % 250 mL chemo infusion, 480 mg (100 % of original dose 478.8 mg), Intravenous,  Once, 0 of 4 cycles Dose modification:   (original dose 478.8 mg, Cycle 5) cyclophosphamide (CYTOXAN) 1,140 mg in sodium chloride 0.9 % 250 mL chemo infusion, 600 mg/m2 = 1,140 mg, Intravenous,  Once, 3 of 4 cycles Dose modification: 500 mg/m2 (original dose 600 mg/m2, Cycle 2, Reason: Dose not tolerated), 400 mg/m2 (original dose 600 mg/m2, Cycle 4, Reason: Dose not tolerated) Administration: 1,140 mg (12/01/2018), 960 mg (12/15/2018), 960 mg (12/29/2018) PACLitaxel (TAXOL) 150 mg in sodium chloride 0.9 % 250 mL chemo infusion (</= 63m/m2), 80 mg/m2 = 150 mg, Intravenous,  Once, 0 of 4 cycles fosaprepitant (EMEND) 150 mg, dexamethasone (DECADRON) 12 mg in sodium chloride 0.9 % 145 mL IVPB, , Intravenous,  Once, 3 of 8 cycles Administration:  (12/01/2018),  (12/15/2018),  (12/29/2018)  for chemotherapy treatment.      CHIEF COMPLIANT: Cycle4Adriamycin and Cytoxan  INTERVAL HISTORY: Carol BARNFIELDis a 73y.o. with above-mentioned history of left breast cancer currently on neoadjuvant chemotherapy with dose dense Adriamycin and Cytoxan. She presents to the clinic todayforcycle4.   Her major complaint is severe profound fatigue.  It has gotten much worse than last treatment.  She is not able to function beyond her activities of daily  living.  She does complain of mild nausea  REVIEW OF SYSTEMS:   Constitutional: Denies fevers, chills or abnormal weight loss, complains of severe fatigue Eyes: Denies blurriness of vision Ears, nose, mouth, throat, and face: Denies mucositis or sore throat Respiratory: Denies cough, dyspnea or  wheezes Cardiovascular: Denies palpitation, chest discomfort Gastrointestinal: Mild nausea and constipation Skin: Denies abnormal skin rashes Lymphatics: Denies new lymphadenopathy or easy bruising Neurological: Denies numbness, tingling or new weaknesses Behavioral/Psych: Mood is stable, no new changes  Extremities: No lower extremity edema Breast: denies any pain or lumps or nodules in either breasts All other systems were reviewed with the patient and are negative.  I have reviewed the past medical history, past surgical history, social history and family history with the patient and they are unchanged from previous note.  ALLERGIES:  is allergic to alpha-gal.  MEDICATIONS:  Current Outpatient Medications  Medication Sig Dispense Refill  . atorvastatin (LIPITOR) 20 MG tablet Take 20 mg by mouth See admin instructions. Takes in the evening on Tuesdays only    . Biotin 5000 MCG TABS Take 5,000 mcg by mouth daily. 30 tablet   . cholecalciferol (VITAMIN D3) 25 MCG (1000 UT) tablet Take 2,000 Units by mouth daily.    Marland Kitchen ezetimibe (ZETIA) 10 MG tablet Take 10 mg by mouth at bedtime.    . finasteride (PROSCAR) 5 MG tablet Take 2.5 mg by mouth at bedtime.    Marland Kitchen levothyroxine (SYNTHROID) 88 MCG tablet Take 88 mcg by mouth daily before breakfast.    . lidocaine-prilocaine (EMLA) cream Apply to affected area once 30 g 3  . LORazepam (ATIVAN) 0.5 MG tablet Take 1 tablet (0.5 mg total) by mouth every 6 (six) hours as needed for sleep. 30 tablet 0  . Methylcellulose, Laxative, (CITRUCEL) 500 MG TABS Take 500 mg by mouth daily.    . ondansetron (ZOFRAN) 8 MG tablet Take 1 tablet (8 mg total) by mouth 2 (two) times daily as needed. Start on the third day after chemotherapy. 30 tablet 1  . pantoprazole (PROTONIX) 40 MG tablet Take 40 mg by mouth 2 (two) times daily before a meal.    . Polyethyl Glycol-Propyl Glycol (SYSTANE) 0.4-0.3 % SOLN Place 1 drop into both eyes at bedtime as needed (dry eyes).     . prochlorperazine (COMPAZINE) 10 MG tablet Take 1 tablet (10 mg total) by mouth every 6 (six) hours as needed (Nausea or vomiting). 30 tablet 1  . telmisartan (MICARDIS) 40 MG tablet Take 40 mg by mouth daily.    . traMADol (ULTRAM) 50 MG tablet Take 2 tablets (100 mg total) by mouth every 6 (six) hours as needed. 5 tablet 0   No current facility-administered medications for this visit.     PHYSICAL EXAMINATION: ECOG PERFORMANCE STATUS: 1 - Symptomatic but completely ambulatory  Vitals:   01/12/19 0830  BP: 129/68  Pulse: 90  Resp: 16  Temp: 98.2 F (36.8 C)  SpO2: 98%   Filed Weights   01/12/19 0830  Weight: 161 lb 9.6 oz (73.3 kg)    GENERAL: alert, no distress and comfortable SKIN: skin color, texture, turgor are normal, no rashes or significant lesions EYES: normal, Conjunctiva are pink and non-injected, sclera clear OROPHARYNX: no exudate, no erythema and lips, buccal mucosa, and tongue normal  NECK: supple, thyroid normal size, non-tender, without nodularity LYMPH: no palpable lymphadenopathy in the cervical, axillary or inguinal LUNGS: clear to auscultation and percussion with normal breathing effort HEART: regular rate & rhythm  and no murmurs and no lower extremity edema ABDOMEN: abdomen soft, non-tender and normal bowel sounds MUSCULOSKELETAL: no cyanosis of digits and no clubbing  NEURO: alert & oriented x 3 with fluent speech, no focal motor/sensory deficits EXTREMITIES: No lower extremity edema  LABORATORY DATA:  I have reviewed the data as listed CMP Latest Ref Rng & Units 12/29/2018 12/15/2018 12/08/2018  Glucose 70 - 99 mg/dL 125(H) 111(H) 112(H)  BUN 8 - 23 mg/dL _0 Creatinine 0.44 - 1.00 mg/dL 1.06(H) 1.11(H) 1.13(H)  Sodium 135 - 145 mmol/L 143 142 135  Potassium 3.5 - 5.1 mmol/L 3.7 4.1 4.5  Chloride 98 - 111 mmol/L 107 106 99  CO2 22 - 32 mmol/L _1 Calcium 8.9 - 10.3 mg/dL 8.5(L) 8.8(L) 8.2(L)  Total Protein 6.5 - 8.1 g/dL 6.3(L) 6.3(L)  7.0  Total Bilirubin 0.3 - 1.2 mg/dL 0.3 0.2(L) 1.3(H)  Alkaline Phos 38 - 126 U/L 74 57 42  AST 15 - 41 U/L 12(L) 12(L) 13(L)  ALT 0 - 44 U/L _2 Lab Results  Component Value Date   WBC 15.8 (H) 01/12/2019   HGB 10.2 (L) 01/12/2019   HCT 30.7 (L) 01/12/2019   MCV 96.8 01/12/2019   PLT 219 01/12/2019   NEUTROABS PENDING 01/12/2019    ASSESSMENT & PLAN:  Malignant neoplasm of upper-outer quadrant of left breast in female, estrogen receptor negative ([place]) 11/08/2018: Routine screening mammogram detected 2 masses in the left breast, measuring 1.6cm and 1.1cm, spanning a total of 3cm. Biopsy confirmed IDC with DCIS, HER-2 negative by FISH (2+ by IHC), ER/PR negative, Ki67 80%.  Staging: T2N0 stage IIb clinical stage Breast MRI: 11/18/2018: 2.9 cm bilobed mass UOQ left breast  Treatment plan 1.Neoadjuvant chemotherapy with dose dense Adriamycin and Cytoxan x4 followed by Taxol and carboplatin 2.Breast conserving surgery with sentinel lymph node biopsy 2.Followed by adjuvant radiation -------------------------------------------------------------------------------------------------- Current treatment: Cycle4doseAdriamycin andCytoxan Echocardiogram 11/26/2018: EF 60 to 65% Labs reviewed  Chemo toxicities: 1.Occasional constipation Mild nausea 2.Leukopenia:Reducedthe dosage of cycle 2 of chemo, currently leukocytosis because of G-CSF effect 3.  Chemo induced anemia: Hemoglobin stable 10.2. 4.  Severe fatigue: Worsened after cycle 2.  We will reduce the dosage for cycle 4 of AC.  Monitoring closely for toxicities.  Return to clinic in2 weeks for cycle 1 Taxol with carboplatin every 3 weeks    No orders of the defined types were placed in this encounter.  The patient has a good understanding of the overall plan. she agrees with it. she will call with any problems that may develop before the next visit here.  Nicholas Lose, MD 01/12/2019  Julious Oka  Dorshimer am acting as scribe for Dr. Nicholas Lose.  I have reviewed the above documentation for accuracy and completeness, and I agree with the above.

## 2019-01-12 ENCOUNTER — Inpatient Hospital Stay: Payer: Medicare Other

## 2019-01-12 ENCOUNTER — Inpatient Hospital Stay (HOSPITAL_BASED_OUTPATIENT_CLINIC_OR_DEPARTMENT_OTHER): Payer: Medicare Other | Admitting: Hematology and Oncology

## 2019-01-12 ENCOUNTER — Encounter: Payer: Self-pay | Admitting: *Deleted

## 2019-01-12 ENCOUNTER — Other Ambulatory Visit: Payer: Self-pay

## 2019-01-12 DIAGNOSIS — Z171 Estrogen receptor negative status [ER-]: Secondary | ICD-10-CM

## 2019-01-12 DIAGNOSIS — C50412 Malignant neoplasm of upper-outer quadrant of left female breast: Secondary | ICD-10-CM | POA: Diagnosis not present

## 2019-01-12 DIAGNOSIS — Z79899 Other long term (current) drug therapy: Secondary | ICD-10-CM | POA: Diagnosis not present

## 2019-01-12 DIAGNOSIS — Z5111 Encounter for antineoplastic chemotherapy: Secondary | ICD-10-CM | POA: Diagnosis not present

## 2019-01-12 DIAGNOSIS — Z7689 Persons encountering health services in other specified circumstances: Secondary | ICD-10-CM | POA: Diagnosis not present

## 2019-01-12 DIAGNOSIS — R112 Nausea with vomiting, unspecified: Secondary | ICD-10-CM | POA: Diagnosis not present

## 2019-01-12 DIAGNOSIS — Z95828 Presence of other vascular implants and grafts: Secondary | ICD-10-CM

## 2019-01-12 LAB — CMP (CANCER CENTER ONLY)
ALT: 8 U/L (ref 0–44)
AST: 13 U/L — ABNORMAL LOW (ref 15–41)
Albumin: 3.7 g/dL (ref 3.5–5.0)
Alkaline Phosphatase: 78 U/L (ref 38–126)
Anion gap: 10 (ref 5–15)
BUN: 16 mg/dL (ref 8–23)
CO2: 24 mmol/L (ref 22–32)
Calcium: 8.7 mg/dL — ABNORMAL LOW (ref 8.9–10.3)
Chloride: 108 mmol/L (ref 98–111)
Creatinine: 1.04 mg/dL — ABNORMAL HIGH (ref 0.44–1.00)
GFR, Est AFR Am: >60 mL/min (ref >60)
GFR, Estimated: 53 mL/min — ABNORMAL LOW (ref >60)
Glucose, Bld: 142 mg/dL — ABNORMAL HIGH (ref 70–99)
Potassium: 4 mmol/L (ref 3.5–5.1)
Sodium: 142 mmol/L (ref 135–145)
Total Bilirubin: 0.4 mg/dL (ref 0.3–1.2)
Total Protein: 6.6 g/dL (ref 6.5–8.1)

## 2019-01-12 LAB — CBC WITH DIFFERENTIAL (CANCER CENTER ONLY)
Abs Immature Granulocytes: 2.56 10*3/uL — ABNORMAL HIGH (ref 0.00–0.07)
Basophils Absolute: 0.1 10*3/uL (ref 0.0–0.1)
Basophils Relative: 1 %
Eosinophils Absolute: 0 10*3/uL (ref 0.0–0.5)
Eosinophils Relative: 0 %
HCT: 30.7 % — ABNORMAL LOW (ref 36.0–46.0)
Hemoglobin: 10.2 g/dL — ABNORMAL LOW (ref 12.0–15.0)
Immature Granulocytes: 16 %
Lymphocytes Relative: 6 %
Lymphs Abs: 1 10*3/uL (ref 0.7–4.0)
MCH: 32.2 pg (ref 26.0–34.0)
MCHC: 33.2 g/dL (ref 30.0–36.0)
MCV: 96.8 fL (ref 80.0–100.0)
Monocytes Absolute: 1.2 10*3/uL — ABNORMAL HIGH (ref 0.1–1.0)
Monocytes Relative: 8 %
Neutro Abs: 10.9 10*3/uL — ABNORMAL HIGH (ref 1.7–7.7)
Neutrophils Relative %: 69 %
Platelet Count: 219 10*3/uL (ref 150–400)
RBC: 3.17 MIL/uL — ABNORMAL LOW (ref 3.87–5.11)
RDW: 13.5 % (ref 11.5–15.5)
WBC Count: 15.8 10*3/uL — ABNORMAL HIGH (ref 4.0–10.5)
nRBC: 0.6 % — ABNORMAL HIGH (ref 0.0–0.2)

## 2019-01-12 MED ORDER — PALONOSETRON HCL INJECTION 0.25 MG/5ML
INTRAVENOUS | Status: AC
  Filled 2019-01-12: qty 5

## 2019-01-12 MED ORDER — SODIUM CHLORIDE 0.9 % IV SOLN
Freq: Once | INTRAVENOUS | Status: AC
  Administered 2019-01-12: 10:00:00 via INTRAVENOUS
  Filled 2019-01-12: qty 250

## 2019-01-12 MED ORDER — SODIUM CHLORIDE 0.9 % IV SOLN
400.0000 mg/m2 | Freq: Once | INTRAVENOUS | Status: AC
  Administered 2019-01-12: 760 mg via INTRAVENOUS
  Filled 2019-01-12: qty 38

## 2019-01-12 MED ORDER — SODIUM CHLORIDE 0.9% FLUSH
10.0000 mL | Freq: Once | INTRAVENOUS | Status: AC
  Administered 2019-01-12: 08:00:00 10 mL
  Filled 2019-01-12: qty 10

## 2019-01-12 MED ORDER — SODIUM CHLORIDE 0.9 % IV SOLN
Freq: Once | INTRAVENOUS | Status: AC
  Administered 2019-01-12: 10:00:00 via INTRAVENOUS
  Filled 2019-01-12: qty 5

## 2019-01-12 MED ORDER — HEPARIN SOD (PORK) LOCK FLUSH 100 UNIT/ML IV SOLN
500.0000 [IU] | Freq: Once | INTRAVENOUS | Status: AC | PRN
  Administered 2019-01-12: 500 [IU]
  Filled 2019-01-12: qty 5

## 2019-01-12 MED ORDER — DOXORUBICIN HCL CHEMO IV INJECTION 2 MG/ML
40.0000 mg/m2 | Freq: Once | INTRAVENOUS | Status: AC
  Administered 2019-01-12: 11:00:00 76 mg via INTRAVENOUS
  Filled 2019-01-12: qty 38

## 2019-01-12 MED ORDER — SODIUM CHLORIDE 0.9% FLUSH
10.0000 mL | INTRAVENOUS | Status: DC | PRN
  Administered 2019-01-12: 10 mL
  Filled 2019-01-12: qty 10

## 2019-01-12 MED ORDER — PALONOSETRON HCL INJECTION 0.25 MG/5ML
0.2500 mg | Freq: Once | INTRAVENOUS | Status: AC
  Administered 2019-01-12: 10:00:00 0.25 mg via INTRAVENOUS

## 2019-01-12 NOTE — Patient Instructions (Signed)
[facility] Discharge Instructions for Patients Receiving Chemotherapy  Today you received the following chemotherapy agents Adriamycin and Cytoxan  To help prevent nausea and vomiting after your treatment, we encourage you to take your nausea medication as directed.  If you develop nausea and vomiting that is not controlled by your nausea medication, call the clinic.   BELOW ARE SYMPTOMS THAT SHOULD BE REPORTED IMMEDIATELY:  *FEVER GREATER THAN 100.5 F  *CHILLS WITH OR WITHOUT FEVER  NAUSEA AND VOMITING THAT IS NOT CONTROLLED WITH YOUR NAUSEA MEDICATION  *UNUSUAL SHORTNESS OF BREATH  *UNUSUAL BRUISING OR BLEEDING  TENDERNESS IN MOUTH AND THROAT WITH OR WITHOUT PRESENCE OF ULCERS  *URINARY PROBLEMS  *BOWEL PROBLEMS  UNUSUAL RASH Items with * indicate a potential emergency and should be followed up as soon as possible.  Feel free to call the clinic should you have any questions or concerns. The clinic phone number is [phone].  Please show the CHEMO ALERT CARD at check-in to the Emergency Department and triage nurse.   

## 2019-01-13 ENCOUNTER — Telehealth: Payer: Self-pay | Admitting: Hematology and Oncology

## 2019-01-13 NOTE — Telephone Encounter (Signed)
I left a message regarding schedule for 9/8

## 2019-01-14 ENCOUNTER — Other Ambulatory Visit: Payer: Self-pay

## 2019-01-14 ENCOUNTER — Encounter: Payer: Self-pay | Admitting: *Deleted

## 2019-01-14 ENCOUNTER — Inpatient Hospital Stay: Payer: Medicare Other

## 2019-01-14 VITALS — BP 108/56 | HR 67 | Temp 98.3°F | Resp 16

## 2019-01-14 DIAGNOSIS — Z5111 Encounter for antineoplastic chemotherapy: Secondary | ICD-10-CM | POA: Diagnosis not present

## 2019-01-14 DIAGNOSIS — Z171 Estrogen receptor negative status [ER-]: Secondary | ICD-10-CM | POA: Diagnosis not present

## 2019-01-14 DIAGNOSIS — Z7689 Persons encountering health services in other specified circumstances: Secondary | ICD-10-CM | POA: Diagnosis not present

## 2019-01-14 DIAGNOSIS — C50412 Malignant neoplasm of upper-outer quadrant of left female breast: Secondary | ICD-10-CM | POA: Diagnosis not present

## 2019-01-14 DIAGNOSIS — R112 Nausea with vomiting, unspecified: Secondary | ICD-10-CM | POA: Diagnosis not present

## 2019-01-14 DIAGNOSIS — Z79899 Other long term (current) drug therapy: Secondary | ICD-10-CM | POA: Diagnosis not present

## 2019-01-14 MED ORDER — PEGFILGRASTIM-JMDB 6 MG/0.6ML ~~LOC~~ SOSY
6.0000 mg | PREFILLED_SYRINGE | Freq: Once | SUBCUTANEOUS | Status: AC
  Administered 2019-01-14: 14:00:00 6 mg via SUBCUTANEOUS
  Filled 2019-01-14: qty 0.6

## 2019-01-14 NOTE — Patient Instructions (Signed)

## 2019-01-16 ENCOUNTER — Encounter: Payer: Self-pay | Admitting: Hematology and Oncology

## 2019-01-17 ENCOUNTER — Other Ambulatory Visit: Payer: Self-pay

## 2019-01-17 ENCOUNTER — Telehealth: Payer: Self-pay

## 2019-01-17 ENCOUNTER — Inpatient Hospital Stay: Payer: Medicare Other

## 2019-01-17 ENCOUNTER — Other Ambulatory Visit: Payer: Self-pay | Admitting: Hematology and Oncology

## 2019-01-17 ENCOUNTER — Inpatient Hospital Stay (HOSPITAL_BASED_OUTPATIENT_CLINIC_OR_DEPARTMENT_OTHER): Payer: Medicare Other | Admitting: Medical

## 2019-01-17 VITALS — BP 103/49 | HR 70 | Temp 98.7°F | Resp 18 | Wt 158.2 lb

## 2019-01-17 DIAGNOSIS — C50412 Malignant neoplasm of upper-outer quadrant of left female breast: Secondary | ICD-10-CM

## 2019-01-17 DIAGNOSIS — Z171 Estrogen receptor negative status [ER-]: Secondary | ICD-10-CM

## 2019-01-17 DIAGNOSIS — Z7689 Persons encountering health services in other specified circumstances: Secondary | ICD-10-CM | POA: Diagnosis not present

## 2019-01-17 DIAGNOSIS — R112 Nausea with vomiting, unspecified: Secondary | ICD-10-CM

## 2019-01-17 DIAGNOSIS — Z79899 Other long term (current) drug therapy: Secondary | ICD-10-CM | POA: Diagnosis not present

## 2019-01-17 DIAGNOSIS — Z95828 Presence of other vascular implants and grafts: Secondary | ICD-10-CM

## 2019-01-17 DIAGNOSIS — Z5111 Encounter for antineoplastic chemotherapy: Secondary | ICD-10-CM | POA: Diagnosis not present

## 2019-01-17 LAB — CMP (CANCER CENTER ONLY)
ALT: 7 U/L (ref 0–44)
AST: 15 U/L (ref 15–41)
Albumin: 3.9 g/dL (ref 3.5–5.0)
Alkaline Phosphatase: 137 U/L — ABNORMAL HIGH (ref 38–126)
Anion gap: 10 (ref 5–15)
BUN: 24 mg/dL — ABNORMAL HIGH (ref 8–23)
CO2: 25 mmol/L (ref 22–32)
Calcium: 8.6 mg/dL — ABNORMAL LOW (ref 8.9–10.3)
Chloride: 106 mmol/L (ref 98–111)
Creatinine: 0.86 mg/dL (ref 0.44–1.00)
GFR, Est AFR Am: >60 mL/min (ref >60)
GFR, Estimated: >60 mL/min (ref >60)
Glucose, Bld: 125 mg/dL — ABNORMAL HIGH (ref 70–99)
Potassium: 4.7 mmol/L (ref 3.5–5.1)
Sodium: 141 mmol/L (ref 135–145)
Total Bilirubin: 0.6 mg/dL (ref 0.3–1.2)
Total Protein: 6.4 g/dL — ABNORMAL LOW (ref 6.5–8.1)

## 2019-01-17 LAB — CBC WITH DIFFERENTIAL (CANCER CENTER ONLY)
Abs Immature Granulocytes: 0 10*3/uL (ref 0.00–0.07)
Basophils Absolute: 0 10*3/uL (ref 0.0–0.1)
Basophils Relative: 0 %
Eosinophils Absolute: 0 10*3/uL (ref 0.0–0.5)
Eosinophils Relative: 0 %
HCT: 29.3 % — ABNORMAL LOW (ref 36.0–46.0)
Hemoglobin: 9.9 g/dL — ABNORMAL LOW (ref 12.0–15.0)
Lymphocytes Relative: 3 %
Lymphs Abs: 0.6 10*3/uL — ABNORMAL LOW (ref 0.7–4.0)
MCH: 33.2 pg (ref 26.0–34.0)
MCHC: 33.8 g/dL (ref 30.0–36.0)
MCV: 98.3 fL (ref 80.0–100.0)
Monocytes Absolute: 0 10*3/uL — ABNORMAL LOW (ref 0.1–1.0)
Monocytes Relative: 0 %
Neutro Abs: 20.4 10*3/uL — ABNORMAL HIGH (ref 1.7–17.7)
Neutrophils Relative %: 97 %
Platelet Count: 227 10*3/uL (ref 150–400)
RBC: 2.98 MIL/uL — ABNORMAL LOW (ref 3.87–5.11)
RDW: 14 % (ref 11.5–15.5)
WBC Count: 21 10*3/uL — ABNORMAL HIGH (ref 4.0–10.5)
nRBC: 0 % (ref 0.0–0.2)

## 2019-01-17 MED ORDER — SODIUM CHLORIDE 0.9 % IV SOLN
Freq: Once | INTRAVENOUS | Status: AC
  Administered 2019-01-17: 16:00:00 via INTRAVENOUS
  Filled 2019-01-17: qty 250

## 2019-01-17 MED ORDER — HEPARIN SOD (PORK) LOCK FLUSH 100 UNIT/ML IV SOLN
500.0000 [IU] | Freq: Once | INTRAVENOUS | Status: AC
  Administered 2019-01-17: 500 [IU]
  Filled 2019-01-17: qty 5

## 2019-01-17 MED ORDER — LORAZEPAM 1 MG PO TABS
ORAL_TABLET | ORAL | Status: AC
  Filled 2019-01-17: qty 1

## 2019-01-17 MED ORDER — SODIUM CHLORIDE 0.9% FLUSH
10.0000 mL | Freq: Once | INTRAVENOUS | Status: AC
  Administered 2019-01-17: 17:00:00 10 mL
  Filled 2019-01-17: qty 10

## 2019-01-17 MED ORDER — LORAZEPAM 1 MG PO TABS
0.5000 mg | ORAL_TABLET | Freq: Once | ORAL | Status: AC
  Administered 2019-01-17: 16:00:00 0.5 mg via SUBLINGUAL

## 2019-01-17 NOTE — Telephone Encounter (Signed)
Pt reporting severe nausea that started mid morning.  Emesis X 1.  Pt reports she is unable to drink liquids without feeling sick, feels weak.  Emesis occurred after taking anti emetics.  Pt does not feel like she is able to tolerate rehydration at home.  S/P D1C4 AC.    Symptom Management appointment made for evaluation per recommendations. Pt aware, voiced agreement.

## 2019-01-17 NOTE — Patient Instructions (Signed)

## 2019-01-17 NOTE — Progress Notes (Signed)
Pt transferred to infusion suite for remainder of IVF infusion with belongings.  Section H.  Report given chairside to [facility].  Pt received 1L IVF NS today and oral ativan as antiemetic, tolerated well.  Reports feeling better with no further nausea after infusion.

## 2019-01-18 ENCOUNTER — Telehealth: Payer: Self-pay

## 2019-01-18 NOTE — Progress Notes (Signed)
[facility] Progress Note   [name] [id number] 26-Mar-1946 73 y.o.  CHANCEY CULLINANE is managed by Dr. Nicholas Lose  Actively treated with chemotherapy/immunotherapy/hormonal therapy: yes  Current therapy: Adriamycin and Cytoxan  Last treated: 01/12/2019 (cycle 4, day 1)  Next scheduled appointment with provider: 01/25/2019  Assessment: Plan:    Non-intractable vomiting with nausea, unspecified vomiting type - Plan: 0.9 %  sodium chloride infusion, LORazepam (ATIVAN) tablet 0.5 mg  Port-A-Cath in place - Plan: heparin lock flush 100 unit/mL, sodium chloride flush (NS) 0.9 % injection 10 mL  Malignant neoplasm of upper-outer quadrant of left breast in female, estrogen receptor negative ([place]) - Plan: heparin lock flush 100 unit/mL, sodium chloride flush (NS) 0.9 % injection 10 mL   Nausea and vomiting: Patient was given 1 L of normal saline IV today.  She was additionally given Ativan 0.5 mg sublingual x1.  She has a prescription for Ativan and was told that she could use this every 6 hours as needed for nausea.  ER negative malignant neoplasm of the left breast: The patient is status post cycle 4, day 1 of Adriamycin and Cytoxan.  She will return to clinic on 01/25/2019 for consideration of dose dense paclitaxel.  Please see After Visit Summary for patient specific instructions.  Future Appointments  Date Time Provider [place]  01/25/2019  9:15 AM [facility] 5 [facility] None  01/25/2019  9:30 AM [facility] [place] FLUSH [facility] None  01/25/2019 10:00 AM Nicholas Lose, MD [facility] None  01/25/2019 11:00 AM [facility] INFUSION [facility] None  02/02/2019 10:30 AM [facility] LAB 5 [facility] None  02/02/2019 10:45 AM [facility] [place] FLUSH [facility] None  02/02/2019 11:15 AM Nicholas Lose, MD [facility] None  02/02/2019 12:15 PM [facility] INFUSION [facility] None  02/09/2019 11:45 AM [facility] ONLY [facility] None  02/09/2019 12:00 PM [facility]  [place] FLUSH [facility] None  02/09/2019  1:00 PM [facility] INFUSION [facility] None  02/16/2019  9:15 AM [facility] LAB 5 [facility] None  02/16/2019  9:30 AM [facility] [place] FLUSH [facility] None  02/16/2019 10:00 AM Causey, Charlestine Massed, NP [facility] None  02/16/2019 11:00 AM [facility] INFUSION [facility] None  02/23/2019 12:45 PM [facility] LAB 2 [facility] None  02/23/2019  1:00 PM [place] FLUSH [facility] None  02/23/2019  2:00 PM [facility] INFUSION [facility] None  03/02/2019  1:00 PM [facility] LAB 2 [facility] None  03/02/2019  1:15 PM [facility] [place] FLUSH [facility] None  03/02/2019  1:45 PM Nicholas Lose, MD [facility] None  03/02/2019  2:30 PM [facility] INFUSION [facility] None    No orders of the defined types were placed in this encounter.      Subjective:   Patient ID:  Byrd Carol is a 73 y.o. (DOB 08/23/1945) female.  Chief Complaint:  Chief Complaint  Patient presents with  . Nausea    HPI Carol Byrd is a 73 year old female with a diagnosis of an ER negative malignant neoplasm of the left breast.  She is managed by Dr. Lindi Adie and is status post cycle 4, day 1 of Adriamycin and Cytoxan.  Plans are to have her return on 01/25/2019 for consideration of dose dense paclitaxel.  She presents to the office today with a report of nausea and vomiting this morning.  She also reports mild dizziness and fatigue.  She took her nausea medicines but vomited immediately thereafter.  She had been eating and drinking okay until this morning.  She developed nausea and vomiting after breakfast.  She denies fevers,  chills, shortness of breath, chest pain, dyspnea on exertion and has had no changes in her urine output.  Medications: I have reviewed the patient's current medications.  Allergies:  Allergies  Allergen Reactions  . Alpha-Gal Anaphylaxis, Itching and Swelling    Past Medical History:  Diagnosis Date  . Alopecia   . Breast cancer ([place])    . Cervical disc disease   . Essential hypertension   . Family history of breast cancer   . Family history of lung cancer   . Family history of melanoma   . Family history of multiple myeloma   . Family history of stomach cancer   . Hypothyroidism   . Impaired fasting glucose   . Mixed hyperlipidemia   . Vitamin D deficiency     Past Surgical History:  Procedure Laterality Date  . BACK SURGERY  1985   bulging lumbar disk  . BIOPSY  04/14/2018   Procedure: BIOPSY;  Surgeon: Rogene Houston, MD;  [facility];  Service: Endoscopy;;  esophagus  . cataracts    . COLONOSCOPY N/A 02/14/2015   Procedure: COLONOSCOPY;  Surgeon: Rogene Houston, MD;  [facility];  Service: Endoscopy;  Laterality: N/A;  1200  . ESOPHAGEAL DILATION N/A 04/14/2018   Procedure: ESOPHAGEAL DILATION;  Surgeon: Rogene Houston, MD;  [facility];  Service: Endoscopy;  Laterality: N/A;  . ESOPHAGOGASTRODUODENOSCOPY N/A 04/14/2018   Procedure: ESOPHAGOGASTRODUODENOSCOPY (EGD);  Surgeon: Rogene Houston, MD;  [facility];  Service: Endoscopy;  Laterality: N/A;  3:10  . PORTACATH PLACEMENT Right 11/30/2018   Procedure: INSERTION PORT-A-CATH WITH ULTRASOUND;  Surgeon: Rolm Bookbinder, MD;  Location: [place];  Service: General;  Laterality: Right;  . RETINAL TEAR REPAIR CRYOTHERAPY    . THYROIDECTOMY      Family History  Problem Relation Age of Onset  . Depression Mother   . Hypertension Mother   . Heart attack Father   . Heart disease Other   . Multiple myeloma Cousin 9       maternal first cousin  . Breast cancer Sister 91       second breast cancer diagnosed at 36  . Breast cancer Maternal Aunt 80  . Cancer Maternal Uncle        unknown  . Stomach cancer Maternal Grandfather 80  . Lung cancer Maternal Uncle 91  . Lung cancer Maternal Uncle 82  . Lung cancer Cousin        maternal first cousins  . Cancer Cousin        unknown, paternal first cousins  .  Colon cancer Neg Hx     Social History   Socioeconomic History  . Marital status: Married    Spouse name: Not on file  . Number of children: Not on file  . Years of education: Not on file  . Highest education level: Not on file  Occupational History  . Occupation: retired  Scientific laboratory technician  . Financial resource strain: Not on file  . Food insecurity    Worry: Not on file    Inability: Not on file  . Transportation needs    Medical: Not on file    Non-medical: Not on file  Tobacco Use  . Smoking status: Former Smoker    Types: Cigarettes    Quit date: 04/14/1994    Years since quitting: 24.7  . Smokeless tobacco: Never Used  Substance and Sexual Activity  . Alcohol use: Yes  Alcohol/week: 2.0 standard drinks    Types: 2 Glasses of wine per week    Comment: Daily - wine  . Drug use: No  . Sexual activity: Not on file  Lifestyle  . Physical activity    Days per week: Not on file    Minutes per session: Not on file  . Stress: Not on file  Relationships  . Social Herbalist on phone: Not on file    Gets together: Not on file    Attends religious service: Not on file    Active member of club or organization: Not on file    Attends meetings of clubs or organizations: Not on file    Relationship status: Not on file  . Intimate partner violence    Fear of current or ex partner: Not on file    Emotionally abused: Not on file    Physically abused: Not on file    Forced sexual activity: Not on file  Other Topics Concern  . Not on file  Social History Narrative  . Not on file    Past Medical History, Surgical history, Social history, and Family history were reviewed and updated as appropriate.   Please see review of systems for further details on the patient's review from today.   Review of Systems:  Review of Systems  Constitutional: Positive for fatigue. Negative for appetite change, chills, diaphoresis and fever.  Respiratory: Negative for cough,  choking, shortness of breath and wheezing.   Cardiovascular: Negative for chest pain and palpitations.  Gastrointestinal: Positive for nausea and vomiting. Negative for constipation and diarrhea.  Genitourinary: Negative for decreased urine volume.  Neurological: Positive for dizziness. Negative for headaches.    Objective:   Physical Exam:  BP (!) 103/49 (BP Location: Left Arm, Patient Position: Sitting)   Pulse 70   Temp 98.7 F (37.1 C) (Oral)   Resp 18   Wt 158 lb 3 oz (71.8 kg)   SpO2 100%   BMI 25.53 kg/m  ECOG: 1  Physical Exam Constitutional:      General: She is not in acute distress.    Appearance: She is not diaphoretic.  HENT:     Head: Normocephalic and atraumatic.  Cardiovascular:     Rate and Rhythm: Normal rate and regular rhythm.     Heart sounds: Normal heart sounds. No murmur. No friction rub. No gallop.   Pulmonary:     Effort: Pulmonary effort is normal. No respiratory distress.     Breath sounds: Normal breath sounds. No stridor. No wheezing or rales.  Abdominal:     General: Bowel sounds are normal. There is no distension.     Palpations: Abdomen is soft.     Tenderness: There is no abdominal tenderness. There is no guarding.  Skin:    General: Skin is warm and dry.  Neurological:     Mental Status: She is alert.     Coordination: Coordination normal.     Gait: Gait normal.     Lab Review:     Component Value Date/Time   NA 141 01/17/2019 1455   K 4.7 01/17/2019 1455   CL 106 01/17/2019 1455   CO2 25 01/17/2019 1455   GLUCOSE 125 (H) 01/17/2019 1455   BUN 24 (H) 01/17/2019 1455   CREATININE 0.86 01/17/2019 1455   CALCIUM 8.6 (L) 01/17/2019 1455   PROT 6.4 (L) 01/17/2019 1455   ALBUMIN 3.9 01/17/2019 1455   AST 15 01/17/2019 1455  ALT 7 01/17/2019 1455   ALKPHOS 137 (H) 01/17/2019 1455   BILITOT 0.6 01/17/2019 1455   GFRNONAA >60 01/17/2019 1455   GFRAA >60 01/17/2019 1455       Component Value Date/Time   WBC 21.0 (H)  01/17/2019 1455   WBC 6.2 11/26/2018 1450   RBC 2.98 (L) 01/17/2019 1455   HGB 9.9 (L) 01/17/2019 1455   HCT 29.3 (L) 01/17/2019 1455   PLT 227 01/17/2019 1455   MCV 98.3 01/17/2019 1455   MCH 33.2 01/17/2019 1455   MCHC 33.8 01/17/2019 1455   RDW 14.0 01/17/2019 1455   LYMPHSABS 0.6 (L) 01/17/2019 1455   MONOABS 0.0 (L) 01/17/2019 1455   EOSABS 0.0 01/17/2019 1455   BASOSABS 0.0 01/17/2019 1455   -------------------------------  Imaging from last 24 hours (if applicable):  Radiology interpretation: No results found.

## 2019-01-18 NOTE — Telephone Encounter (Signed)
RN spoke with patient regarding questions about how to take dexamethasone and anti emetics prior to upcoming appointments.   RN explained medications and provided a hand out for patient.  All questions answered, no further needs at this time.

## 2019-01-19 ENCOUNTER — Telehealth: Payer: Self-pay | Admitting: Medical

## 2019-01-19 NOTE — Telephone Encounter (Signed)
Per 8/31 no los

## 2019-01-21 DIAGNOSIS — I10 Essential (primary) hypertension: Secondary | ICD-10-CM | POA: Diagnosis not present

## 2019-01-21 DIAGNOSIS — C50912 Malignant neoplasm of unspecified site of left female breast: Secondary | ICD-10-CM | POA: Diagnosis not present

## 2019-01-23 NOTE — Progress Notes (Signed)
Patient Care Team: [name], MD as PCP - General (Internal Medicine) Satira Sark, MD as PCP - Cardiology (Cardiology)  DIAGNOSIS:    ICD-10-CM   1. Malignant neoplasm of upper-outer quadrant of left breast in female, estrogen receptor negative ([place])  C50.412    Z17.1     SUMMARY OF ONCOLOGIC HISTORY: Oncology History  Malignant neoplasm of upper-outer quadrant of left breast in female, estrogen receptor negative ([place])  11/08/2018 Initial Diagnosis   Routine screening mammogram detected 2 masses in the left breast, measuring 1.6cm and 1.1cm, spanning a total of 3cm. Biopsy confirmed IDC with DCIS, HER-2 negative by FISH (2+ by IHC), ER/PR negative, Ki67 80%.    11/18/2018 Breast MRI   2.9 centimeter bilobed mass in the UOQ of the left breast, consistent with known malignancy. 0.5 centimeter satellite nodule is 0.5 centimeters below this mass.   11/22/2018 Cancer Staging   Staging form: Breast, AJCC 8th Edition - Clinical stage from 11/22/2018: Stage IIB (cT2, cN0, cM0, G3, ER-, PR-, HER2-) - Signed by Nicholas Lose, MD on 11/22/2018   11/27/2018 Genetic Testing   Patient had genetic testing done for a personal and family history of breast cancer.  Results were negative on the [facility] Breast cancer STAT gene panel. The STAT Breast cancer panel offered by [facility] includes sequencing and rearrangement analysis for the following 7 genes:  BRCA1, BRCA2, CDH1, PALB2, PTEN, STK11 and TP53.  The report date is 11/27/2018.   12/01/2018 -  Chemotherapy   The patient had DOXOrubicin (ADRIAMYCIN) chemo injection 114 mg, 60 mg/m2 = 114 mg, Intravenous,  Once, 4 of 4 cycles Dose modification: 50 mg/m2 (original dose 60 mg/m2, Cycle 2, Reason: Dose not tolerated), 40 mg/m2 (original dose 60 mg/m2, Cycle 4, Reason: Dose not tolerated) Administration: 114 mg (12/01/2018), 96 mg (12/15/2018), 96 mg (12/29/2018), 76 mg (01/12/2019) palonosetron (ALOXI) injection 0.25 mg, 0.25 mg, Intravenous,  Once, 4 of 8  cycles Administration: 0.25 mg (12/01/2018), 0.25 mg (12/15/2018), 0.25 mg (12/29/2018), 0.25 mg (01/12/2019) pegfilgrastim-jmdb (FULPHILA) injection 6 mg, 6 mg, Subcutaneous,  Once, 4 of 4 cycles Administration: 6 mg (12/03/2018), 6 mg (12/17/2018), 6 mg (12/31/2018), 6 mg (01/14/2019) CARBOplatin (PARAPLATIN) 480 mg in sodium chloride 0.9 % 250 mL chemo infusion, 510 mg (106.9 % of original dose 478.8 mg), Intravenous,  Once, 0 of 4 cycles Dose modification:   (original dose 478.8 mg, Cycle 5) cyclophosphamide (CYTOXAN) 1,140 mg in sodium chloride 0.9 % 250 mL chemo infusion, 600 mg/m2 = 1,140 mg, Intravenous,  Once, 4 of 4 cycles Dose modification: 500 mg/m2 (original dose 600 mg/m2, Cycle 2, Reason: Dose not tolerated), 400 mg/m2 (original dose 600 mg/m2, Cycle 4, Reason: Dose not tolerated) Administration: 1,140 mg (12/01/2018), 960 mg (12/15/2018), 960 mg (12/29/2018), 760 mg (01/12/2019) PACLitaxel (TAXOL) 150 mg in sodium chloride 0.9 % 250 mL chemo infusion (</= 59m/m2), 80 mg/m2 = 150 mg, Intravenous,  Once, 0 of 4 cycles fosaprepitant (EMEND) 150 mg, dexamethasone (DECADRON) 12 mg in sodium chloride 0.9 % 145 mL IVPB, , Intravenous,  Once, 4 of 8 cycles Administration:  (12/01/2018),  (12/15/2018),  (12/29/2018),  (01/12/2019)  for chemotherapy treatment.      CHIEF COMPLIANT: Cycle 1 Taxol  INTERVAL HISTORY: Carol Byrd a 73y.o. with above-mentioned history of left breast cancer currently on neoadjuvant chemotherapy. She completed 4 cycles of Adriamycin and Cytoxan and will begin weekly Taxol treatments today. She presents to the clinic today for cycle 1.   REVIEW OF  SYSTEMS:   Constitutional: Denies fevers, chills or abnormal weight loss Eyes: Denies blurriness of vision Ears, nose, mouth, throat, and face: Denies mucositis or sore throat Respiratory: Denies cough, dyspnea or wheezes Cardiovascular: Denies palpitation, chest discomfort Gastrointestinal: Denies nausea, heartburn or  change in bowel habits Skin: Denies abnormal skin rashes Lymphatics: Denies new lymphadenopathy or easy bruising Neurological: Denies numbness, tingling or new weaknesses Behavioral/Psych: Mood is stable, no new changes  Extremities: No lower extremity edema Breast: denies any pain or lumps or nodules in either breasts All other systems were reviewed with the patient and are negative.  I have reviewed the past medical history, past surgical history, social history and family history with the patient and they are unchanged from previous note.  ALLERGIES:  is allergic to alpha-gal.  MEDICATIONS:  Current Outpatient Medications  Medication Sig Dispense Refill  . atorvastatin (LIPITOR) 20 MG tablet Take 20 mg by mouth See admin instructions. Takes in the evening on Tuesdays only    . Biotin 5000 MCG TABS Take 5,000 mcg by mouth daily. 30 tablet   . cholecalciferol (VITAMIN D3) 25 MCG (1000 UT) tablet Take 2,000 Units by mouth daily.    Marland Kitchen ezetimibe (ZETIA) 10 MG tablet Take 10 mg by mouth at bedtime.    . finasteride (PROSCAR) 5 MG tablet Take 2.5 mg by mouth at bedtime.    Marland Kitchen levothyroxine (SYNTHROID) 88 MCG tablet Take 88 mcg by mouth daily before breakfast.    . lidocaine-prilocaine (EMLA) cream Apply to affected area once 30 g 3  . LORazepam (ATIVAN) 0.5 MG tablet Take 1 tablet (0.5 mg total) by mouth every 6 (six) hours as needed for sleep. 30 tablet 0  . Methylcellulose, Laxative, (CITRUCEL) 500 MG TABS Take 500 mg by mouth daily.    . ondansetron (ZOFRAN) 8 MG tablet Take 1 tablet (8 mg total) by mouth 2 (two) times daily as needed. Start on the third day after chemotherapy. 30 tablet 1  . pantoprazole (PROTONIX) 40 MG tablet Take 40 mg by mouth 2 (two) times daily before a meal.    . Polyethyl Glycol-Propyl Glycol (SYSTANE) 0.4-0.3 % SOLN Place 1 drop into both eyes at bedtime as needed (dry eyes).    . prochlorperazine (COMPAZINE) 10 MG tablet TAKE ONE TABLET (10MG TOTAL) BY MOUTH  EVERY SIX HOURS AS NEEDED FOR NAUSEA OR VOMITING 30 tablet 1  . telmisartan (MICARDIS) 40 MG tablet Take 40 mg by mouth daily.    . traMADol (ULTRAM) 50 MG tablet Take 2 tablets (100 mg total) by mouth every 6 (six) hours as needed. 5 tablet 0   No current facility-administered medications for this visit.     PHYSICAL EXAMINATION: ECOG PERFORMANCE STATUS: 1 - Symptomatic but completely ambulatory  Vitals:   01/25/19 1011  BP: 117/63  Pulse: (!) 109  Resp: 18  Temp: 98.3 F (36.8 C)  SpO2: 100%   Filed Weights   01/25/19 1011  Weight: 161 lb 8 oz (73.3 kg)    GENERAL: alert, no distress and comfortable SKIN: skin color, texture, turgor are normal, no rashes or significant lesions EYES: normal, Conjunctiva are pink and non-injected, sclera clear OROPHARYNX: no exudate, no erythema and lips, buccal mucosa, and tongue normal  NECK: supple, thyroid normal size, non-tender, without nodularity LYMPH: no palpable lymphadenopathy in the cervical, axillary or inguinal LUNGS: clear to auscultation and percussion with normal breathing effort HEART: regular rate & rhythm and no murmurs and no lower extremity edema ABDOMEN: abdomen soft,  non-tender and normal bowel sounds MUSCULOSKELETAL: no cyanosis of digits and no clubbing  NEURO: alert & oriented x 3 with fluent speech, no focal motor/sensory deficits EXTREMITIES: No lower extremity edema  LABORATORY DATA:  I have reviewed the data as listed CMP Latest Ref Rng & Units 01/17/2019 01/12/2019 12/29/2018  Glucose 70 - 99 mg/dL 125(H) 142(H) 125(H)  BUN 8 - 23 mg/dL 24(H) 16 16  Creatinine 0.44 - 1.00 mg/dL 0.86 1.04(H) 1.06(H)  Sodium 135 - 145 mmol/L 141 142 143  Potassium 3.5 - 5.1 mmol/L 4.7 4.0 3.7  Chloride 98 - 111 mmol/L 106 108 107  CO2 22 - 32 mmol/L _0 Calcium 8.9 - 10.3 mg/dL 8.6(L) 8.7(L) 8.5(L)  Total Protein 6.5 - 8.1 g/dL 6.4(L) 6.6 6.3(L)  Total Bilirubin 0.3 - 1.2 mg/dL 0.6 0.4 0.3  Alkaline Phos 38 - 126 U/L  137(H) 78 74  AST 15 - 41 U/L 15 13(L) 12(L)  ALT 0 - 44 U/L _1 Lab Results  Component Value Date   WBC 18.9 (H) 01/25/2019   HGB 9.5 (L) 01/25/2019   HCT 29.0 (L) 01/25/2019   MCV 100.7 (H) 01/25/2019   PLT 151 01/25/2019   NEUTROABS 15.4 (H) 01/25/2019    ASSESSMENT & PLAN:  Malignant neoplasm of upper-outer quadrant of left breast in female, estrogen receptor negative ([place]) 11/08/2018: Routine screening mammogram detected 2 masses in the left breast, measuring 1.6cm and 1.1cm, spanning a total of 3cm. Biopsy confirmed IDC with DCIS, HER-2 negative by FISH (2+ by IHC), ER/PR negative, Ki67 80%.  Staging: T2N0 stage IIb clinical stage Breast MRI: 11/18/2018: 2.9 cm bilobed mass UOQ left breast  Treatment plan 1.Neoadjuvant chemotherapy with dose dense Adriamycin and Cytoxan x4 followed by Taxol and carboplatin 2.Breast conserving surgery with sentinel lymph node biopsy 2.Followed by adjuvant radiation -------------------------------------------------------------------------------------------------- Current treatment: Completed 4 cycles of dose denseAdriamycin andCytoxan, today cycle 1 of Taxol given with carboplatin Echocardiogram 11/26/2018: EF 60 to 65% Labs reviewed  Chemo toxicities: 1.Chemo induced anemia: Hemoglobin stable 9.5 today.  She has some palpitations with exertion as well as mild dizziness when she gets up. 2.Severe fatigue 3.  Alopecia  I reviewed the antiemetic regimen once again with her Monitoring closely for toxicities.  Return to clinic in1 week for cycle 2 Taxol and for toxicity evaluation.    No orders of the defined types were placed in this encounter.  The patient has a good understanding of the overall plan. she agrees with it. she will call with any problems that may develop before the next visit here.  Nicholas Lose, MD 01/25/2019  Julious Oka Dorshimer am acting as scribe for Dr. Nicholas Lose.  I have reviewed the above  documentation for accuracy and completeness, and I agree with the above.

## 2019-01-25 ENCOUNTER — Encounter: Payer: Self-pay | Admitting: *Deleted

## 2019-01-25 ENCOUNTER — Inpatient Hospital Stay: Payer: Medicare Other

## 2019-01-25 ENCOUNTER — Inpatient Hospital Stay (HOSPITAL_BASED_OUTPATIENT_CLINIC_OR_DEPARTMENT_OTHER): Payer: Medicare Other | Admitting: Hematology and Oncology

## 2019-01-25 ENCOUNTER — Other Ambulatory Visit: Payer: Self-pay

## 2019-01-25 ENCOUNTER — Inpatient Hospital Stay: Payer: Medicare Other | Attending: Hematology and Oncology

## 2019-01-25 VITALS — BP 110/64 | HR 69 | Temp 97.8°F | Resp 16

## 2019-01-25 DIAGNOSIS — D6481 Anemia due to antineoplastic chemotherapy: Secondary | ICD-10-CM | POA: Diagnosis not present

## 2019-01-25 DIAGNOSIS — Z79899 Other long term (current) drug therapy: Secondary | ICD-10-CM | POA: Diagnosis not present

## 2019-01-25 DIAGNOSIS — C50412 Malignant neoplasm of upper-outer quadrant of left female breast: Secondary | ICD-10-CM

## 2019-01-25 DIAGNOSIS — Z95828 Presence of other vascular implants and grafts: Secondary | ICD-10-CM

## 2019-01-25 DIAGNOSIS — R5383 Other fatigue: Secondary | ICD-10-CM | POA: Diagnosis not present

## 2019-01-25 DIAGNOSIS — R42 Dizziness and giddiness: Secondary | ICD-10-CM | POA: Diagnosis not present

## 2019-01-25 DIAGNOSIS — D696 Thrombocytopenia, unspecified: Secondary | ICD-10-CM | POA: Insufficient documentation

## 2019-01-25 DIAGNOSIS — Z803 Family history of malignant neoplasm of breast: Secondary | ICD-10-CM | POA: Insufficient documentation

## 2019-01-25 DIAGNOSIS — Z171 Estrogen receptor negative status [ER-]: Secondary | ICD-10-CM | POA: Diagnosis not present

## 2019-01-25 DIAGNOSIS — Z5111 Encounter for antineoplastic chemotherapy: Secondary | ICD-10-CM | POA: Insufficient documentation

## 2019-01-25 DIAGNOSIS — R002 Palpitations: Secondary | ICD-10-CM | POA: Diagnosis not present

## 2019-01-25 LAB — CBC WITH DIFFERENTIAL (CANCER CENTER ONLY)
Abs Immature Granulocytes: 1.56 10*3/uL — ABNORMAL HIGH (ref 0.00–0.07)
Basophils Absolute: 0.1 10*3/uL (ref 0.0–0.1)
Basophils Relative: 1 %
Eosinophils Absolute: 0 10*3/uL (ref 0.0–0.5)
Eosinophils Relative: 0 %
HCT: 29 % — ABNORMAL LOW (ref 36.0–46.0)
Hemoglobin: 9.5 g/dL — ABNORMAL LOW (ref 12.0–15.0)
Immature Granulocytes: 8 %
Lymphocytes Relative: 4 %
Lymphs Abs: 0.7 10*3/uL (ref 0.7–4.0)
MCH: 33 pg (ref 26.0–34.0)
MCHC: 32.8 g/dL (ref 30.0–36.0)
MCV: 100.7 fL — ABNORMAL HIGH (ref 80.0–100.0)
Monocytes Absolute: 1 10*3/uL (ref 0.1–1.0)
Monocytes Relative: 5 %
Neutro Abs: 15.4 10*3/uL — ABNORMAL HIGH (ref 1.7–7.7)
Neutrophils Relative %: 82 %
Platelet Count: 151 10*3/uL (ref 150–400)
RBC: 2.88 MIL/uL — ABNORMAL LOW (ref 3.87–5.11)
RDW: 15.9 % — ABNORMAL HIGH (ref 11.5–15.5)
WBC Count: 18.9 10*3/uL — ABNORMAL HIGH (ref 4.0–10.5)
nRBC: 0.3 % — ABNORMAL HIGH (ref 0.0–0.2)

## 2019-01-25 LAB — CMP (CANCER CENTER ONLY)
ALT: 6 U/L (ref 0–44)
AST: 12 U/L — ABNORMAL LOW (ref 15–41)
Albumin: 3.9 g/dL (ref 3.5–5.0)
Alkaline Phosphatase: 75 U/L (ref 38–126)
Anion gap: 11 (ref 5–15)
BUN: 18 mg/dL (ref 8–23)
CO2: 22 mmol/L (ref 22–32)
Calcium: 8.7 mg/dL — ABNORMAL LOW (ref 8.9–10.3)
Chloride: 109 mmol/L (ref 98–111)
Creatinine: 0.96 mg/dL (ref 0.44–1.00)
GFR, Est AFR Am: >60 mL/min (ref >60)
GFR, Estimated: 58 mL/min — ABNORMAL LOW (ref >60)
Glucose, Bld: 154 mg/dL — ABNORMAL HIGH (ref 70–99)
Potassium: 3.7 mmol/L (ref 3.5–5.1)
Sodium: 142 mmol/L (ref 135–145)
Total Bilirubin: 0.4 mg/dL (ref 0.3–1.2)
Total Protein: 6.4 g/dL — ABNORMAL LOW (ref 6.5–8.1)

## 2019-01-25 MED ORDER — SODIUM CHLORIDE 0.9 % IV SOLN
Freq: Once | INTRAVENOUS | Status: AC
  Administered 2019-01-25: 12:00:00 via INTRAVENOUS
  Filled 2019-01-25: qty 5

## 2019-01-25 MED ORDER — PALONOSETRON HCL INJECTION 0.25 MG/5ML
0.2500 mg | Freq: Once | INTRAVENOUS | Status: AC
  Administered 2019-01-25: 0.25 mg via INTRAVENOUS

## 2019-01-25 MED ORDER — FAMOTIDINE IN NACL 20-0.9 MG/50ML-% IV SOLN
20.0000 mg | Freq: Once | INTRAVENOUS | Status: AC
  Administered 2019-01-25: 11:00:00 20 mg via INTRAVENOUS

## 2019-01-25 MED ORDER — SODIUM CHLORIDE 0.9 % IV SOLN
80.0000 mg/m2 | Freq: Once | INTRAVENOUS | Status: AC
  Administered 2019-01-25: 150 mg via INTRAVENOUS
  Filled 2019-01-25: qty 25

## 2019-01-25 MED ORDER — FAMOTIDINE IN NACL 20-0.9 MG/50ML-% IV SOLN
INTRAVENOUS | Status: AC
  Filled 2019-01-25: qty 50

## 2019-01-25 MED ORDER — HEPARIN SOD (PORK) LOCK FLUSH 100 UNIT/ML IV SOLN
500.0000 [IU] | Freq: Once | INTRAVENOUS | Status: AC | PRN
  Administered 2019-01-25: 15:00:00 500 [IU]
  Filled 2019-01-25: qty 5

## 2019-01-25 MED ORDER — SODIUM CHLORIDE 0.9% FLUSH
10.0000 mL | Freq: Once | INTRAVENOUS | Status: AC
  Administered 2019-01-25: 10:00:00 10 mL
  Filled 2019-01-25: qty 10

## 2019-01-25 MED ORDER — SODIUM CHLORIDE 0.9 % IV SOLN
511.8000 mg | Freq: Once | INTRAVENOUS | Status: AC
  Administered 2019-01-25: 14:00:00 510 mg via INTRAVENOUS
  Filled 2019-01-25: qty 51

## 2019-01-25 MED ORDER — DIPHENHYDRAMINE HCL 50 MG/ML IJ SOLN
50.0000 mg | Freq: Once | INTRAMUSCULAR | Status: AC
  Administered 2019-01-25: 50 mg via INTRAVENOUS

## 2019-01-25 MED ORDER — PALONOSETRON HCL INJECTION 0.25 MG/5ML
INTRAVENOUS | Status: AC
  Filled 2019-01-25: qty 5

## 2019-01-25 MED ORDER — SODIUM CHLORIDE 0.9% FLUSH
10.0000 mL | INTRAVENOUS | Status: DC | PRN
  Administered 2019-01-25: 10 mL
  Filled 2019-01-25: qty 10

## 2019-01-25 MED ORDER — DIPHENHYDRAMINE HCL 50 MG/ML IJ SOLN
INTRAMUSCULAR | Status: AC
  Filled 2019-01-25: qty 1

## 2019-01-25 MED ORDER — SODIUM CHLORIDE 0.9 % IV SOLN
Freq: Once | INTRAVENOUS | Status: AC
  Administered 2019-01-25: 11:00:00 via INTRAVENOUS
  Filled 2019-01-25: qty 250

## 2019-01-25 NOTE — Patient Instructions (Signed)
Carboplatin injection What is this medicine? CARBOPLATIN (KAR boe pla tin) is a chemotherapy drug. It targets fast dividing cells, like cancer cells, and causes these cells to die. This medicine is used to treat ovarian cancer and many other cancers. This medicine may be used for other purposes; ask your health care provider or pharmacist if you have questions. COMMON BRAND NAME(S): Paraplatin What should I tell my health care provider before I take this medicine? They need to know if you have any of these conditions:  blood disorders  hearing problems  kidney disease  recent or ongoing radiation therapy  an unusual or allergic reaction to carboplatin, cisplatin, other chemotherapy, other medicines, foods, dyes, or preservatives  pregnant or trying to get pregnant  breast-feeding How should I use this medicine? This drug is usually given as an infusion into a vein. It is administered in a hospital or clinic by a specially trained health care professional. Talk to your pediatrician regarding the use of this medicine in children. Special care may be needed. Overdosage: If you think you have taken too much of this medicine contact a poison control center or emergency room at once. NOTE: This medicine is only for you. Do not share this medicine with others. What if I miss a dose? It is important not to miss a dose. Call your doctor or health care professional if you are unable to keep an appointment. What may interact with this medicine?  medicines for seizures  medicines to increase blood counts like filgrastim, pegfilgrastim, sargramostim  some antibiotics like amikacin, gentamicin, neomycin, streptomycin, tobramycin  vaccines Talk to your doctor or health care professional before taking any of these medicines:  acetaminophen  aspirin  ibuprofen  ketoprofen  naproxen This list may not describe all possible interactions. Give your health care provider a list of all the  medicines, herbs, non-prescription drugs, or dietary supplements you use. Also tell them if you smoke, drink alcohol, or use illegal drugs. Some items may interact with your medicine. What should I watch for while using this medicine? Your condition will be monitored carefully while you are receiving this medicine. You will need important blood work done while you are taking this medicine. This drug may make you feel generally unwell. This is not uncommon, as chemotherapy can affect healthy cells as well as cancer cells. Report any side effects. Continue your course of treatment even though you feel ill unless your doctor tells you to stop. In some cases, you may be given additional medicines to help with side effects. Follow all directions for their use. Call your doctor or health care professional for advice if you get a fever, chills or sore throat, or other symptoms of a cold or flu. Do not treat yourself. This drug decreases your body's ability to fight infections. Try to avoid being around people who are sick. This medicine may increase your risk to bruise or bleed. Call your doctor or health care professional if you notice any unusual bleeding. Be careful brushing and flossing your teeth or using a toothpick because you may get an infection or bleed more easily. If you have any dental work done, tell your dentist you are receiving this medicine. Avoid taking products that contain aspirin, acetaminophen, ibuprofen, naproxen, or ketoprofen unless instructed by your doctor. These medicines may hide a fever. Do not become pregnant while taking this medicine. Women should inform their doctor if they wish to become pregnant or think they might be pregnant. There is a  potential for serious side effects to an unborn child. Talk to your health care professional or pharmacist for more information. Do not breast-feed an infant while taking this medicine. What side effects may I notice from receiving this  medicine? Side effects that you should report to your doctor or health care professional as soon as possible:  allergic reactions like skin rash, itching or hives, swelling of the face, lips, or tongue  signs of infection - fever or chills, cough, sore throat, pain or difficulty passing urine  signs of decreased platelets or bleeding - bruising, pinpoint red spots on the skin, black, tarry stools, nosebleeds  signs of decreased red blood cells - unusually weak or tired, fainting spells, lightheadedness  breathing problems  changes in hearing  changes in vision  chest pain  high blood pressure  low blood counts - This drug may decrease the number of white blood cells, red blood cells and platelets. You may be at increased risk for infections and bleeding.  nausea and vomiting  pain, swelling, redness or irritation at the injection site  pain, tingling, numbness in the hands or feet  problems with balance, talking, walking  trouble passing urine or change in the amount of urine Side effects that usually do not require medical attention (report to your doctor or health care professional if they continue or are bothersome):  hair loss  loss of appetite  metallic taste in the mouth or changes in taste This list may not describe all possible side effects. Call your doctor for medical advice about side effects. You may report side effects to FDA at 1-800-FDA-1088. Where should I keep my medicine? This drug is given in a hospital or clinic and will not be stored at home. NOTE: This sheet is a summary. It may not cover all possible information. If you have questions about this medicine, talk to your doctor, pharmacist, or health care provider.  2020 Elsevier/Gold Standard (2007-08-10 14:38:05) Paclitaxel injection What is this medicine? PACLITAXEL (PAK li TAX el) is a chemotherapy drug. It targets fast dividing cells, like cancer cells, and causes these cells to die. This  medicine is used to treat ovarian cancer, breast cancer, lung cancer, Kaposi's sarcoma, and other cancers. This medicine may be used for other purposes; ask your health care provider or pharmacist if you have questions. COMMON BRAND NAME(S): Onxol, Taxol What should I tell my health care provider before I take this medicine? They need to know if you have any of these conditions:  history of irregular heartbeat  liver disease  low blood counts, like low white cell, platelet, or red cell counts  lung or breathing disease, like asthma  tingling of the fingers or toes, or other nerve disorder  an unusual or allergic reaction to paclitaxel, alcohol, polyoxyethylated castor oil, other chemotherapy, other medicines, foods, dyes, or preservatives  pregnant or trying to get pregnant  breast-feeding How should I use this medicine? This drug is given as an infusion into a vein. It is administered in a hospital or clinic by a specially trained health care professional. Talk to your pediatrician regarding the use of this medicine in children. Special care may be needed. Overdosage: If you think you have taken too much of this medicine contact a poison control center or emergency room at once. NOTE: This medicine is only for you. Do not share this medicine with others. What if I miss a dose? It is important not to miss your dose. Call your doctor or  health care professional if you are unable to keep an appointment. What may interact with this medicine? Do not take this medicine with any of the following medications:  disulfiram  metronidazole This medicine may also interact with the following medications:  antiviral medicines for hepatitis, HIV or AIDS  certain antibiotics like erythromycin and clarithromycin  certain medicines for fungal infections like ketoconazole and itraconazole  certain medicines for seizures like carbamazepine, phenobarbital,  phenytoin  gemfibrozil  nefazodone  rifampin  St. John's wort This list may not describe all possible interactions. Give your health care provider a list of all the medicines, herbs, non-prescription drugs, or dietary supplements you use. Also tell them if you smoke, drink alcohol, or use illegal drugs. Some items may interact with your medicine. What should I watch for while using this medicine? Your condition will be monitored carefully while you are receiving this medicine. You will need important blood work done while you are taking this medicine. This medicine can cause serious allergic reactions. To reduce your risk you will need to take other medicine(s) before treatment with this medicine. If you experience allergic reactions like skin rash, itching or hives, swelling of the face, lips, or tongue, tell your doctor or health care professional right away. In some cases, you may be given additional medicines to help with side effects. Follow all directions for their use. This drug may make you feel generally unwell. This is not uncommon, as chemotherapy can affect healthy cells as well as cancer cells. Report any side effects. Continue your course of treatment even though you feel ill unless your doctor tells you to stop. Call your doctor or health care professional for advice if you get a fever, chills or sore throat, or other symptoms of a cold or flu. Do not treat yourself. This drug decreases your body's ability to fight infections. Try to avoid being around people who are sick. This medicine may increase your risk to bruise or bleed. Call your doctor or health care professional if you notice any unusual bleeding. Be careful brushing and flossing your teeth or using a toothpick because you may get an infection or bleed more easily. If you have any dental work done, tell your dentist you are receiving this medicine. Avoid taking products that contain aspirin, acetaminophen, ibuprofen,  naproxen, or ketoprofen unless instructed by your doctor. These medicines may hide a fever. Do not become pregnant while taking this medicine. Women should inform their doctor if they wish to become pregnant or think they might be pregnant. There is a potential for serious side effects to an unborn child. Talk to your health care professional or pharmacist for more information. Do not breast-feed an infant while taking this medicine. Men are advised not to father a child while receiving this medicine. This product may contain alcohol. Ask your pharmacist or healthcare provider if this medicine contains alcohol. Be sure to tell all healthcare providers you are taking this medicine. Certain medicines, like metronidazole and disulfiram, can cause an unpleasant reaction when taken with alcohol. The reaction includes flushing, headache, nausea, vomiting, sweating, and increased thirst. The reaction can last from 30 minutes to several hours. What side effects may I notice from receiving this medicine? Side effects that you should report to your doctor or health care professional as soon as possible:  allergic reactions like skin rash, itching or hives, swelling of the face, lips, or tongue  breathing problems  changes in vision  fast, irregular heartbeat  high or  low blood pressure  mouth sores  pain, tingling, numbness in the hands or feet  signs of decreased platelets or bleeding - bruising, pinpoint red spots on the skin, black, tarry stools, blood in the urine  signs of decreased red blood cells - unusually weak or tired, feeling faint or lightheaded, falls  signs of infection - fever or chills, cough, sore throat, pain or difficulty passing urine  signs and symptoms of liver injury like dark yellow or brown urine; general ill feeling or flu-like symptoms; light-colored stools; loss of appetite; nausea; right upper belly pain; unusually weak or tired; yellowing of the eyes or  skin  swelling of the ankles, feet, hands  unusually slow heartbeat Side effects that usually do not require medical attention (report to your doctor or health care professional if they continue or are bothersome):  diarrhea  hair loss  loss of appetite  muscle or joint pain  nausea, vomiting  pain, redness, or irritation at site where injected  tiredness This list may not describe all possible side effects. Call your doctor for medical advice about side effects. You may report side effects to FDA at 1-800-FDA-1088. Where should I keep my medicine? This drug is given in a hospital or clinic and will not be stored at home. NOTE: This sheet is a summary. It may not cover all possible information. If you have questions about this medicine, talk to your doctor, pharmacist, or health care provider.  2020 Elsevier/Gold Standard (2017-01-06 13:14:55)

## 2019-01-25 NOTE — Assessment & Plan Note (Signed)
11/08/2018: Routine screening mammogram detected 2 masses in the left breast, measuring 1.6cm and 1.1cm, spanning a total of 3cm. Biopsy confirmed IDC with DCIS, HER-2 negative by FISH (2+ by IHC), ER/PR negative, Ki67 80%.  Staging: T2N0 stage IIb clinical stage Breast MRI: 11/18/2018: 2.9 cm bilobed mass UOQ left breast  Treatment plan 1.Neoadjuvant chemotherapy with dose dense Adriamycin and Cytoxan x4 followed by Taxol and carboplatin 2.Breast conserving surgery with sentinel lymph node biopsy 2.Followed by adjuvant radiation -------------------------------------------------------------------------------------------------- Current treatment: Completed 4 cycles of dose denseAdriamycin andCytoxan, today cycle 1 of Taxol given with carboplatin Echocardiogram 11/26/2018: EF 60 to 65% Labs reviewed  Chemo toxicities: 1.Chemo induced anemia: Hemoglobin stable 10.2. 2.Severe fatigue 3.  Alopecia  Monitoring closely for toxicities.  Return to clinic in1 week for cycle 2 Taxol and for toxicity evaluation.

## 2019-01-26 ENCOUNTER — Other Ambulatory Visit: Payer: Medicare Other

## 2019-01-26 ENCOUNTER — Telehealth: Payer: Self-pay | Admitting: *Deleted

## 2019-01-26 ENCOUNTER — Encounter: Payer: Self-pay | Admitting: Hematology and Oncology

## 2019-01-26 ENCOUNTER — Ambulatory Visit: Payer: Medicare Other | Admitting: Adult Health

## 2019-01-27 ENCOUNTER — Telehealth: Payer: Self-pay | Admitting: Hematology and Oncology

## 2019-01-27 NOTE — Telephone Encounter (Signed)
Called pt per 9/10 sch message - called to confirm patients apts are on Wednesdays .

## 2019-02-01 NOTE — Progress Notes (Signed)
Patient Care Team: [name], MD as PCP - General (Internal Medicine) Satira Sark, MD as PCP - Cardiology (Cardiology)  DIAGNOSIS:    ICD-10-CM   1. Malignant neoplasm of upper-outer quadrant of left breast in female, estrogen receptor negative ([place])  C50.412    Z17.1     SUMMARY OF ONCOLOGIC HISTORY: Oncology History  Malignant neoplasm of upper-outer quadrant of left breast in female, estrogen receptor negative ([place])  11/08/2018 Initial Diagnosis   Routine screening mammogram detected 2 masses in the left breast, measuring 1.6cm and 1.1cm, spanning a total of 3cm. Biopsy confirmed IDC with DCIS, HER-2 negative by FISH (2+ by IHC), ER/PR negative, Ki67 80%.    11/18/2018 Breast MRI   2.9 centimeter bilobed mass in the UOQ of the left breast, consistent with known malignancy. 0.5 centimeter satellite nodule is 0.5 centimeters below this mass.   11/22/2018 Cancer Staging   Staging form: Breast, AJCC 8th Edition - Clinical stage from 11/22/2018: Stage IIB (cT2, cN0, cM0, G3, ER-, PR-, HER2-) - Signed by Nicholas Lose, MD on 11/22/2018   11/27/2018 Genetic Testing   Patient had genetic testing done for a personal and family history of breast cancer.  Results were negative on the [facility] Breast cancer STAT gene panel. The STAT Breast cancer panel offered by [facility] includes sequencing and rearrangement analysis for the following 7 genes:  BRCA1, BRCA2, CDH1, PALB2, PTEN, STK11 and TP53.  The report date is 11/27/2018.   12/01/2018 -  Chemotherapy   The patient had DOXOrubicin (ADRIAMYCIN) chemo injection 114 mg, 60 mg/m2 = 114 mg, Intravenous,  Once, 4 of 4 cycles Dose modification: 50 mg/m2 (original dose 60 mg/m2, Cycle 2, Reason: Dose not tolerated), 40 mg/m2 (original dose 60 mg/m2, Cycle 4, Reason: Dose not tolerated) Administration: 114 mg (12/01/2018), 96 mg (12/15/2018), 96 mg (12/29/2018), 76 mg (01/12/2019) palonosetron (ALOXI) injection 0.25 mg, 0.25 mg, Intravenous,  Once, 5 of 8  cycles Administration: 0.25 mg (12/01/2018), 0.25 mg (01/25/2019), 0.25 mg (12/15/2018), 0.25 mg (12/29/2018), 0.25 mg (01/12/2019) pegfilgrastim-jmdb (FULPHILA) injection 6 mg, 6 mg, Subcutaneous,  Once, 4 of 4 cycles Administration: 6 mg (12/03/2018), 6 mg (12/17/2018), 6 mg (12/31/2018), 6 mg (01/14/2019) CARBOplatin (PARAPLATIN) 510 mg in sodium chloride 0.9 % 250 mL chemo infusion, 510 mg (106.9 % of original dose 478.8 mg), Intravenous,  Once, 1 of 4 cycles Dose modification:   (original dose 478.8 mg, Cycle 5) Administration: 510 mg (01/25/2019) cyclophosphamide (CYTOXAN) 1,140 mg in sodium chloride 0.9 % 250 mL chemo infusion, 600 mg/m2 = 1,140 mg, Intravenous,  Once, 4 of 4 cycles Dose modification: 500 mg/m2 (original dose 600 mg/m2, Cycle 2, Reason: Dose not tolerated), 400 mg/m2 (original dose 600 mg/m2, Cycle 4, Reason: Dose not tolerated) Administration: 1,140 mg (12/01/2018), 960 mg (12/15/2018), 960 mg (12/29/2018), 760 mg (01/12/2019) PACLitaxel (TAXOL) 150 mg in sodium chloride 0.9 % 250 mL chemo infusion (</= 38m/m2), 80 mg/m2 = 150 mg, Intravenous,  Once, 1 of 4 cycles Administration: 150 mg (01/25/2019) fosaprepitant (EMEND) 150 mg, dexamethasone (DECADRON) 12 mg in sodium chloride 0.9 % 145 mL IVPB, , Intravenous,  Once, 5 of 8 cycles Administration:  (12/01/2018),  (01/25/2019),  (12/15/2018),  (12/29/2018),  (01/12/2019)  for chemotherapy treatment.      CHIEF COMPLIANT: Cycle 2 Taxol (being held because of toxicities)  INTERVAL HISTORY: Carol SOMMAis a 73y.o. with above-mentioned history of left breast cancer currently on neoadjuvant chemotherapy. She completed 4 cycles of Adriamycin and Cytoxan and is  currently receiving weekly Taxol treatments. She presents to the clinic today for cycle 2.  After the first cycle of Taxol she had profound problems including severe fatigue, generalized weakness, palpitations to the point that she is using a wheelchair today for ambulation.  She feels  completely wiped out and tired.  REVIEW OF SYSTEMS:   Constitutional: Severe generalized fatigue and weakness Eyes: Denies blurriness of vision Ears, nose, mouth, throat, and face: Denies mucositis or sore throat Respiratory: Denies cough, dyspnea or wheezes Cardiovascular: Palpitations Gastrointestinal: Denies nausea, heartburn or change in bowel habits Skin: Denies abnormal skin rashes Lymphatics: Denies new lymphadenopathy or easy bruising Neurological: Denies numbness, tingling or new weaknesses Behavioral/Psych: Mood is stable, no new changes  Extremities: No lower extremity edema Breast: denies any pain or lumps or nodules in either breasts All other systems were reviewed with the patient and are negative.  I have reviewed the past medical history, past surgical history, social history and family history with the patient and they are unchanged from previous note.  ALLERGIES:  is allergic to alpha-gal.  MEDICATIONS:  Current Outpatient Medications  Medication Sig Dispense Refill  . atorvastatin (LIPITOR) 20 MG tablet Take 20 mg by mouth See admin instructions. Takes in the evening on Tuesdays only    . Biotin 5000 MCG TABS Take 5,000 mcg by mouth daily. 30 tablet   . cholecalciferol (VITAMIN D3) 25 MCG (1000 UT) tablet Take 2,000 Units by mouth daily.    Marland Kitchen ezetimibe (ZETIA) 10 MG tablet Take 10 mg by mouth at bedtime.    . finasteride (PROSCAR) 5 MG tablet Take 2.5 mg by mouth at bedtime.    Marland Kitchen levothyroxine (SYNTHROID) 88 MCG tablet Take 88 mcg by mouth daily before breakfast.    . lidocaine-prilocaine (EMLA) cream Apply to affected area once 30 g 3  . LORazepam (ATIVAN) 0.5 MG tablet Take 1 tablet (0.5 mg total) by mouth every 6 (six) hours as needed for sleep. 30 tablet 0  . Methylcellulose, Laxative, (CITRUCEL) 500 MG TABS Take 500 mg by mouth daily.    . ondansetron (ZOFRAN) 8 MG tablet Take 1 tablet (8 mg total) by mouth 2 (two) times daily as needed. Start on the third day  after chemotherapy. 30 tablet 1  . pantoprazole (PROTONIX) 40 MG tablet Take 40 mg by mouth 2 (two) times daily before a meal.    . Polyethyl Glycol-Propyl Glycol (SYSTANE) 0.4-0.3 % SOLN Place 1 drop into both eyes at bedtime as needed (dry eyes).    . prochlorperazine (COMPAZINE) 10 MG tablet TAKE ONE TABLET (10MG TOTAL) BY MOUTH EVERY SIX HOURS AS NEEDED FOR NAUSEA OR VOMITING 30 tablet 1  . telmisartan (MICARDIS) 40 MG tablet Take 40 mg by mouth daily.    . traMADol (ULTRAM) 50 MG tablet Take 2 tablets (100 mg total) by mouth every 6 (six) hours as needed. 5 tablet 0   No current facility-administered medications for this visit.     PHYSICAL EXAMINATION: ECOG PERFORMANCE STATUS: 2 - Symptomatic, <50% confined to bed  Vitals:   02/02/19 1100  BP: 127/65  Pulse: 95  Resp: 17  Temp: 97.6 F (36.4 C)  SpO2: 100%   Filed Weights   02/02/19 1100  Weight: 158 lb 12.8 oz (72 kg)    GENERAL: alert, no distress and comfortable SKIN: skin color, texture, turgor are normal, no rashes or significant lesions EYES: normal, Conjunctiva are pink and non-injected, sclera clear OROPHARYNX: no exudate, no erythema and lips, buccal  mucosa, and tongue normal  NECK: supple, thyroid normal size, non-tender, without nodularity LYMPH: no palpable lymphadenopathy in the cervical, axillary or inguinal LUNGS: clear to auscultation and percussion with normal breathing effort HEART: regular rate & rhythm and no murmurs and no lower extremity edema ABDOMEN: abdomen soft, non-tender and normal bowel sounds MUSCULOSKELETAL: no cyanosis of digits and no clubbing  NEURO: alert & oriented x 3 with fluent speech, no focal motor/sensory deficits EXTREMITIES: No lower extremity edema  LABORATORY DATA:  I have reviewed the data as listed CMP Latest Ref Rng & Units 01/25/2019 01/17/2019 01/12/2019  Glucose 70 - 99 mg/dL 154(H) 125(H) 142(H)  BUN 8 - 23 mg/dL 18 24(H) 16  Creatinine 0.44 - 1.00 mg/dL 0.96 0.86  1.04(H)  Sodium 135 - 145 mmol/L 142 141 142  Potassium 3.5 - 5.1 mmol/L 3.7 4.7 4.0  Chloride 98 - 111 mmol/L 109 106 108  CO2 22 - 32 mmol/L 22 25 24   Calcium 8.9 - 10.3 mg/dL 8.7(L) 8.6(L) 8.7(L)  Total Protein 6.5 - 8.1 g/dL 6.4(L) 6.4(L) 6.6  Total Bilirubin 0.3 - 1.2 mg/dL 0.4 0.6 0.4  Alkaline Phos 38 - 126 U/L 75 137(H) 78  AST 15 - 41 U/L 12(L) 15 13(L)  ALT 0 - 44 U/L 6 7 8     Lab Results  Component Value Date   WBC 1.8 (L) 02/02/2019   HGB 7.9 (L) 02/02/2019   HCT 23.5 (L) 02/02/2019   MCV 97.9 02/02/2019   PLT 132 (L) 02/02/2019   NEUTROABS 1.2 (L) 02/02/2019    ASSESSMENT & PLAN:  Malignant neoplasm of upper-outer quadrant of left breast in female, estrogen receptor negative ([place]) 11/08/2018: Routine screening mammogram detected 2 masses in the left breast, measuring 1.6cm and 1.1cm, spanning a total of 3cm. Biopsy confirmed IDC with DCIS, HER-2 negative by FISH (2+ by IHC), ER/PR negative, Ki67 80%.  Staging: T2N0 stage IIb clinical stage Breast MRI: 11/18/2018: 2.9 cm bilobed mass UOQ left breast  Treatment plan 1.Neoadjuvant chemotherapy with dose dense Adriamycin and Cytoxan x4 followed by Taxol and carboplatin 2.Breast conserving surgery with sentinel lymph node biopsy 2.Followed by adjuvant radiation -------------------------------------------------------------------------------------------------- Current treatment: Completed 4 cycles of dose denseAdriamycin andCytoxan, today cycle 1 of Taxol given with carboplatin Echocardiogram 11/26/2018: EF 60 to 65% Labs reviewed  Chemo toxicities: 1.Chemo induced anemia: Hemoglobin 7.9 with severe symptoms of palpitations, dizziness, shortness of breath and hypotension.  I recommended that she get 1 unit of PRBC today.  She has some palpitations with exertion as well as dizziness when she gets up.  Because of this we will hold off on today's chemotherapy and give her 1 unit of PRBC.  We will reassess her next  week and determine what we need to do.  It is possible that we may be able to continue her treatment with significant dose reduction. 2.Severe fatigue 3.  Alopecia 4.  Palpitations due to anemia  Monitoring closely for toxicities.  Return to clinic weekly for chemo and I will see her next week for follow-up  No orders of the defined types were placed in this encounter.  The patient has a good understanding of the overall plan. she agrees with it. she will call with any problems that may develop before the next visit here.  Nicholas Lose, MD 02/02/2019  Julious Oka Dorshimer am acting as scribe for Dr. Nicholas Lose.  I have reviewed the above documentation for accuracy and completeness, and I agree with the above.

## 2019-02-02 ENCOUNTER — Inpatient Hospital Stay: Payer: Medicare Other

## 2019-02-02 ENCOUNTER — Inpatient Hospital Stay (HOSPITAL_BASED_OUTPATIENT_CLINIC_OR_DEPARTMENT_OTHER): Payer: Medicare Other | Admitting: Hematology and Oncology

## 2019-02-02 ENCOUNTER — Other Ambulatory Visit: Payer: Self-pay

## 2019-02-02 ENCOUNTER — Other Ambulatory Visit: Payer: Self-pay | Admitting: *Deleted

## 2019-02-02 VITALS — BP 121/64 | HR 70 | Temp 98.2°F | Resp 16

## 2019-02-02 DIAGNOSIS — Z5111 Encounter for antineoplastic chemotherapy: Secondary | ICD-10-CM | POA: Diagnosis not present

## 2019-02-02 DIAGNOSIS — C50412 Malignant neoplasm of upper-outer quadrant of left female breast: Secondary | ICD-10-CM

## 2019-02-02 DIAGNOSIS — Z171 Estrogen receptor negative status [ER-]: Secondary | ICD-10-CM

## 2019-02-02 DIAGNOSIS — D6481 Anemia due to antineoplastic chemotherapy: Secondary | ICD-10-CM | POA: Diagnosis not present

## 2019-02-02 DIAGNOSIS — Z79899 Other long term (current) drug therapy: Secondary | ICD-10-CM | POA: Diagnosis not present

## 2019-02-02 DIAGNOSIS — Z95828 Presence of other vascular implants and grafts: Secondary | ICD-10-CM

## 2019-02-02 DIAGNOSIS — R42 Dizziness and giddiness: Secondary | ICD-10-CM | POA: Diagnosis not present

## 2019-02-02 DIAGNOSIS — Z803 Family history of malignant neoplasm of breast: Secondary | ICD-10-CM | POA: Diagnosis not present

## 2019-02-02 DIAGNOSIS — R002 Palpitations: Secondary | ICD-10-CM | POA: Diagnosis not present

## 2019-02-02 DIAGNOSIS — R5383 Other fatigue: Secondary | ICD-10-CM | POA: Diagnosis not present

## 2019-02-02 DIAGNOSIS — D696 Thrombocytopenia, unspecified: Secondary | ICD-10-CM | POA: Diagnosis not present

## 2019-02-02 LAB — CBC WITH DIFFERENTIAL (CANCER CENTER ONLY)
Abs Immature Granulocytes: 0.02 10*3/uL (ref 0.00–0.07)
Basophils Absolute: 0 10*3/uL (ref 0.0–0.1)
Basophils Relative: 2 %
Eosinophils Absolute: 0 10*3/uL (ref 0.0–0.5)
Eosinophils Relative: 1 %
HCT: 23.5 % — ABNORMAL LOW (ref 36.0–46.0)
Hemoglobin: 7.9 g/dL — ABNORMAL LOW (ref 12.0–15.0)
Immature Granulocytes: 1 %
Lymphocytes Relative: 20 %
Lymphs Abs: 0.4 10*3/uL — ABNORMAL LOW (ref 0.7–4.0)
MCH: 32.9 pg (ref 26.0–34.0)
MCHC: 33.6 g/dL (ref 30.0–36.0)
MCV: 97.9 fL (ref 80.0–100.0)
Monocytes Absolute: 0.2 10*3/uL (ref 0.1–1.0)
Monocytes Relative: 11 %
Neutro Abs: 1.2 10*3/uL — ABNORMAL LOW (ref 1.7–7.7)
Neutrophils Relative %: 65 %
Platelet Count: 132 10*3/uL — ABNORMAL LOW (ref 150–400)
RBC: 2.4 MIL/uL — ABNORMAL LOW (ref 3.87–5.11)
RDW: 14.8 % (ref 11.5–15.5)
WBC Count: 1.8 10*3/uL — ABNORMAL LOW (ref 4.0–10.5)
nRBC: 0 % (ref 0.0–0.2)

## 2019-02-02 LAB — CMP (CANCER CENTER ONLY)
ALT: 10 U/L (ref 0–44)
AST: 15 U/L (ref 15–41)
Albumin: 3.8 g/dL (ref 3.5–5.0)
Alkaline Phosphatase: 53 U/L (ref 38–126)
Anion gap: 8 (ref 5–15)
BUN: 23 mg/dL (ref 8–23)
CO2: 26 mmol/L (ref 22–32)
Calcium: 8.7 mg/dL — ABNORMAL LOW (ref 8.9–10.3)
Chloride: 104 mmol/L (ref 98–111)
Creatinine: 0.89 mg/dL (ref 0.44–1.00)
GFR, Est AFR Am: >60 mL/min (ref >60)
GFR, Estimated: >60 mL/min (ref >60)
Glucose, Bld: 114 mg/dL — ABNORMAL HIGH (ref 70–99)
Potassium: 3.9 mmol/L (ref 3.5–5.1)
Sodium: 138 mmol/L (ref 135–145)
Total Bilirubin: 0.6 mg/dL (ref 0.3–1.2)
Total Protein: 6.2 g/dL — ABNORMAL LOW (ref 6.5–8.1)

## 2019-02-02 LAB — PREPARE RBC (CROSSMATCH)

## 2019-02-02 LAB — ABO/RH: ABO/RH(D): O POS

## 2019-02-02 MED ORDER — HEPARIN SOD (PORK) LOCK FLUSH 100 UNIT/ML IV SOLN
500.0000 [IU] | Freq: Once | INTRAVENOUS | Status: AC
  Administered 2019-02-02: 15:00:00 500 [IU]
  Filled 2019-02-02: qty 5

## 2019-02-02 MED ORDER — ACETAMINOPHEN 325 MG PO TABS
ORAL_TABLET | ORAL | Status: AC
  Filled 2019-02-02: qty 2

## 2019-02-02 MED ORDER — SODIUM CHLORIDE 0.9 % IV SOLN
INTRAVENOUS | Status: DC
  Administered 2019-02-02: 12:00:00 via INTRAVENOUS
  Filled 2019-02-02: qty 250

## 2019-02-02 MED ORDER — SODIUM CHLORIDE 0.9% FLUSH
10.0000 mL | Freq: Once | INTRAVENOUS | Status: AC
  Administered 2019-02-02: 10 mL
  Filled 2019-02-02: qty 10

## 2019-02-02 MED ORDER — DIPHENHYDRAMINE HCL 25 MG PO CAPS
25.0000 mg | ORAL_CAPSULE | Freq: Once | ORAL | Status: AC
  Administered 2019-02-02: 25 mg via ORAL

## 2019-02-02 MED ORDER — DIPHENHYDRAMINE HCL 25 MG PO CAPS
ORAL_CAPSULE | ORAL | Status: AC
  Filled 2019-02-02: qty 1

## 2019-02-02 MED ORDER — ACETAMINOPHEN 325 MG PO TABS
650.0000 mg | ORAL_TABLET | Freq: Once | ORAL | Status: AC
  Administered 2019-02-02: 650 mg via ORAL

## 2019-02-02 MED ORDER — SODIUM CHLORIDE 0.9% FLUSH
10.0000 mL | Freq: Once | INTRAVENOUS | Status: AC
  Administered 2019-02-02: 11:00:00 10 mL
  Filled 2019-02-02: qty 10

## 2019-02-02 NOTE — Assessment & Plan Note (Signed)
11/08/2018: Routine screening mammogram detected 2 masses in the left breast, measuring 1.6cm and 1.1cm, spanning a total of 3cm. Biopsy confirmed IDC with DCIS, HER-2 negative by FISH (2+ by IHC), ER/PR negative, Ki67 80%.  Staging: T2N0 stage IIb clinical stage Breast MRI: 11/18/2018: 2.9 cm bilobed mass UOQ left breast  Treatment plan 1.Neoadjuvant chemotherapy with dose dense Adriamycin and Cytoxan x4 followed by Taxol and carboplatin 2.Breast conserving surgery with sentinel lymph node biopsy 2.Followed by adjuvant radiation -------------------------------------------------------------------------------------------------- Current treatment: Completed 4 cycles of dose denseAdriamycin andCytoxan, today cycle 1 of Taxol given with carboplatin Echocardiogram 11/26/2018: EF 60 to 65% Labs reviewed  Chemo toxicities: 1.Chemo induced anemia: Hemoglobin stable 9.5 today.  She has some palpitations with exertion as well as mild dizziness when she gets up. 2.Severe fatigue 3.  Alopecia  Monitoring closely for toxicities.  Return to clinic weekly for chemo and every other week for follow-up with me.

## 2019-02-02 NOTE — Patient Instructions (Signed)
Blood Transfusion, Adult  A blood transfusion is a procedure in which you receive donated blood, including plasma, platelets, and red blood cells, through an IV tube. You may need a blood transfusion because of illness, surgery, or injury. The blood may come from a donor. You may also be able to donate blood for yourself (autologous blood donation) before a surgery if you know that you might require a blood transfusion. The blood given in a transfusion is made up of different types of cells. You may receive:  Red blood cells. These carry oxygen to the cells in the body.  White blood cells. These help you fight infections.  Platelets. These help your blood to clot.  Plasma. This is the liquid part of your blood and it helps with fluid imbalances. If you have hemophilia or another clotting disorder, you may also receive other types of blood products. Tell a health care provider about:  Any allergies you have.  All medicines you are taking, including vitamins, herbs, eye drops, creams, and over-the-counter medicines.  Any problems you or family members have had with anesthetic medicines.  Any blood disorders you have.  Any surgeries you have had.  Any medical conditions you have, including any recent fever or cold symptoms.  Whether you are pregnant or may be pregnant.  Any previous reactions you have had during a blood transfusion. What are the risks? Generally, this is a safe procedure. However, problems may occur, including:  Having an allergic reaction to something in the donated blood. Hives and itching may be symptoms of this type of reaction.  Fever. This may be a reaction to the white blood cells in the transfused blood. Nausea or chest pain may accompany a fever.  Iron overload. This can happen from having many transfusions.  Transfusion-related acute lung injury (TRALI). This is a rare reaction that causes lung damage. The cause is not known.TRALI can occur within hours  of a transfusion or several days later.  Sudden (acute) or delayed hemolytic reactions. This happens if your blood does not match the cells in your transfusion. Your bodys defense system (immune system) may try to attack the new cells. This complication is rare. The symptoms include fever, chills, nausea, and low back pain or chest pain.  Infection or disease transmission. This is rare. What happens before the procedure?  You will have a blood test to determine your blood type. This is necessary to know what kind of blood your body will accept and to match it to the donor blood.  If you are going to have a planned surgery, you may be able to do an autologous blood donation. This may be done in case you need to have a transfusion.  If you have had an allergic reaction to a transfusion in the past, you may be given medicine to help prevent a reaction. This medicine may be given to you by mouth or through an IV tube.  You will have your temperature, blood pressure, and pulse monitored before the transfusion.  Follow instructions from your health care provider about eating and drinking restrictions.  Ask your health care provider about: ? Changing or stopping your regular medicines. This is especially important if you are taking diabetes medicines or blood thinners. ? Taking medicines such as aspirin and ibuprofen. These medicines can thin your blood. Do not take these medicines before your procedure if your health care provider instructs you not to. What happens during the procedure?  An IV tube will be  inserted into one of your veins.  The bag of donated blood will be attached to your IV tube. The blood will then enter through your vein.  Your temperature, blood pressure, and pulse will be monitored regularly during the transfusion. This monitoring is done to detect early signs of a transfusion reaction.  If you have any signs or symptoms of a reaction, your transfusion will be stopped and  you may be given medicine.  When the transfusion is complete, your IV tube will be removed.  Pressure may be applied to the IV site for a few minutes.  A bandage (dressing) will be applied. The procedure may vary among health care providers and hospitals. What happens after the procedure?  Your temperature, blood pressure, heart rate, breathing rate, and blood oxygen level will be monitored often.  Your blood may be tested to see how you are responding to the transfusion.  You may be warmed with fluids or blankets to maintain a normal body temperature. Summary  A blood transfusion is a procedure in which you receive donated blood, including plasma, platelets, and red blood cells, through an IV tube.  Your temperature, blood pressure, and pulse will be monitored before, during, and after the transfusion.  Your blood may be tested after the transfusion to see how your body has responded. This information is not intended to replace advice given to you by your health care provider. Make sure you discuss any questions you have with your health care provider. Document Released: 05/02/2000 Document Revised: 03/22/2018 Document Reviewed: 01/31/2016 Elsevier Patient Education  2020 [employer].   Blood Transfusion, Adult, Care After This sheet gives you information about how to care for yourself after your procedure. Your doctor may also give you more specific instructions. If you have problems or questions, contact your doctor. Follow these instructions at home:   Take over-the-counter and prescription medicines only as told by your doctor.  Go back to your normal activities as told by your doctor.  Follow instructions from your doctor about how to take care of the area where an IV tube was put into your vein (insertion site). Make sure you: ? Wash your hands with soap and water before you change your bandage (dressing). If there is no soap and water, use hand sanitizer. ? Change your  bandage as told by your doctor.  Check your IV insertion site every day for signs of infection. Check for: ? More redness, swelling, or pain. ? More fluid or blood. ? Warmth. ? Pus or a bad smell. Contact a doctor if:  You have more redness, swelling, or pain around the IV insertion site.  You have more fluid or blood coming from the IV insertion site.  Your IV insertion site feels warm to the touch.  You have pus or a bad smell coming from the IV insertion site.  Your pee (urine) turns pink, red, or brown.  You feel weak after doing your normal activities. Get help right away if:  You have signs of a serious allergic or body defense (immune) system reaction, including: ? Itchiness. ? Hives. ? Trouble breathing. ? Anxiety. ? Pain in your chest or lower back. ? Fever, flushing, and chills. ? Fast pulse. ? Rash. ? Watery poop (diarrhea). ? Throwing up (vomiting). ? Dark pee. ? Serious headache. ? Dizziness. ? Stiff neck. ? Yellow color in your face or the white parts of your eyes (jaundice). Summary  After a blood transfusion, return to your normal activities as  told by your doctor.  Every day, check for signs of infection where the IV tube was put into your vein.  Some signs of infection are warm skin, more redness and pain, more fluid or blood, and pus or a bad smell where the needle went in.  Contact your doctor if you feel weak or have any unusual symptoms. This information is not intended to replace advice given to you by your health care provider. Make sure you discuss any questions you have with your health care provider. Document Released: 05/26/2014 Document Revised: 09/09/2017 Document Reviewed: 12/28/2015 Elsevier Patient Education  2020 [employer].

## 2019-02-03 ENCOUNTER — Telehealth: Payer: Self-pay | Admitting: Hematology and Oncology

## 2019-02-03 ENCOUNTER — Encounter: Payer: Self-pay | Admitting: Hematology and Oncology

## 2019-02-03 LAB — TYPE AND SCREEN
ABO/RH(D): O POS
Antibody Screen: NEGATIVE
Unit division: 0

## 2019-02-03 LAB — BPAM RBC
Blood Product Expiration Date: 202010162359
ISSUE DATE / TIME: 202009161319
Unit Type and Rh: 5100

## 2019-02-03 NOTE — Telephone Encounter (Signed)
Patient called me to discuss her treatment plan.  She was wondering if she should stop all chemotherapy and proceed to surgery.  I informed her that the reason for her severe side effects were because of the carboplatin that was given along with Taxol.  For subsequent chemotherapies we will discontinue carboplatin and just treat her with weekly Taxol. We will reassess the treatment plan when she comes back to see [place] next week.

## 2019-02-03 NOTE — Telephone Encounter (Signed)
I left a message regarding schedule  

## 2019-02-08 NOTE — Progress Notes (Signed)
Patient Care Team: [name], MD as PCP - General (Internal Medicine) Satira Sark, MD as PCP - Cardiology (Cardiology)  DIAGNOSIS:    ICD-10-CM   1. Malignant neoplasm of upper-outer quadrant of left breast in female, estrogen receptor negative ([place])  C50.412    Z17.1     SUMMARY OF ONCOLOGIC HISTORY: Oncology History  Malignant neoplasm of upper-outer quadrant of left breast in female, estrogen receptor negative ([place])  11/08/2018 Initial Diagnosis   Routine screening mammogram detected 2 masses in the left breast, measuring 1.6cm and 1.1cm, spanning a total of 3cm. Biopsy confirmed IDC with DCIS, HER-2 negative by FISH (2+ by IHC), ER/PR negative, Ki67 80%.    11/18/2018 Breast MRI   2.9 centimeter bilobed mass in the UOQ of the left breast, consistent with known malignancy. 0.5 centimeter satellite nodule is 0.5 centimeters below this mass.   11/22/2018 Cancer Staging   Staging form: Breast, AJCC 8th Edition - Clinical stage from 11/22/2018: Stage IIB (cT2, cN0, cM0, G3, ER-, PR-, HER2-) - Signed by Nicholas Lose, MD on 11/22/2018   11/27/2018 Genetic Testing   Patient had genetic testing done for a personal and family history of breast cancer.  Results were negative on the [facility] Breast cancer STAT gene panel. The STAT Breast cancer panel offered by [facility] includes sequencing and rearrangement analysis for the following 7 genes:  BRCA1, BRCA2, CDH1, PALB2, PTEN, STK11 and TP53.  The report date is 11/27/2018.   12/01/2018 -  Chemotherapy   The patient had DOXOrubicin (ADRIAMYCIN) chemo injection 114 mg, 60 mg/m2 = 114 mg, Intravenous,  Once, 4 of 4 cycles Dose modification: 50 mg/m2 (original dose 60 mg/m2, Cycle 2, Reason: Dose not tolerated), 40 mg/m2 (original dose 60 mg/m2, Cycle 4, Reason: Dose not tolerated) Administration: 114 mg (12/01/2018), 96 mg (12/15/2018), 96 mg (12/29/2018), 76 mg (01/12/2019) palonosetron (ALOXI) injection 0.25 mg, 0.25 mg, Intravenous,  Once, 5 of 8  cycles Administration: 0.25 mg (12/01/2018), 0.25 mg (01/25/2019), 0.25 mg (12/15/2018), 0.25 mg (12/29/2018), 0.25 mg (01/12/2019) pegfilgrastim-jmdb (FULPHILA) injection 6 mg, 6 mg, Subcutaneous,  Once, 4 of 4 cycles Administration: 6 mg (12/03/2018), 6 mg (12/17/2018), 6 mg (12/31/2018), 6 mg (01/14/2019) CARBOplatin (PARAPLATIN) 510 mg in sodium chloride 0.9 % 250 mL chemo infusion, 510 mg (106.9 % of original dose 478.8 mg), Intravenous,  Once, 1 of 4 cycles Dose modification:   (original dose 478.8 mg, Cycle 5) Administration: 510 mg (01/25/2019) cyclophosphamide (CYTOXAN) 1,140 mg in sodium chloride 0.9 % 250 mL chemo infusion, 600 mg/m2 = 1,140 mg, Intravenous,  Once, 4 of 4 cycles Dose modification: 500 mg/m2 (original dose 600 mg/m2, Cycle 2, Reason: Dose not tolerated), 400 mg/m2 (original dose 600 mg/m2, Cycle 4, Reason: Dose not tolerated) Administration: 1,140 mg (12/01/2018), 960 mg (12/15/2018), 960 mg (12/29/2018), 760 mg (01/12/2019) PACLitaxel (TAXOL) 150 mg in sodium chloride 0.9 % 250 mL chemo infusion (</= '80mg'$ /m2), 80 mg/m2 = 150 mg, Intravenous,  Once, 1 of 4 cycles Administration: 150 mg (01/25/2019) fosaprepitant (EMEND) 150 mg, dexamethasone (DECADRON) 12 mg in sodium chloride 0.9 % 145 mL IVPB, , Intravenous,  Once, 5 of 8 cycles Administration:  (12/01/2018),  (01/25/2019),  (12/15/2018),  (12/29/2018),  (01/12/2019)  for chemotherapy treatment.      CHIEF COMPLIANT: Cycle 2 Taxol   INTERVAL HISTORY: Carol Byrd is a 73 y.o. with above-mentioned history of left breast cancer currently on neoadjuvant chemotherapy. She completed 4 cycles of Adriamycin and Cytoxan and is currently receiving weekly Taxol  and Carboplatin treatments. Cycle 2 was held last week due to profound fatigue, weakness, and palpitations. She received one unit of PRBCs. She presents to the clinic today for cycle 2 and to discuss further treatment.  She still feels weak but somewhat better.  She still feels dizziness  when she leans forward.  She does not complain of any bruising or bleeding.  REVIEW OF SYSTEMS:   Constitutional: Denies fevers, chills or abnormal weight loss Eyes: Denies blurriness of vision Ears, nose, mouth, throat, and face: Denies mucositis or sore throat Respiratory: Denies cough, dyspnea or wheezes Cardiovascular: Denies palpitation, chest discomfort Gastrointestinal: Denies nausea, heartburn or change in bowel habits Skin: Denies abnormal skin rashes Lymphatics: Denies new lymphadenopathy or easy bruising Neurological: Denies numbness, tingling or new weaknesses Behavioral/Psych: Mood is stable, no new changes  Extremities: No lower extremity edema Breast: denies any pain or lumps or nodules in either breasts All other systems were reviewed with the patient and are negative.  I have reviewed the past medical history, past surgical history, social history and family history with the patient and they are unchanged from previous note.  ALLERGIES:  is allergic to alpha-gal.  MEDICATIONS:  Current Outpatient Medications  Medication Sig Dispense Refill  . atorvastatin (LIPITOR) 20 MG tablet Take 20 mg by mouth See admin instructions. Takes in the evening on Tuesdays only    . Biotin 5000 MCG TABS Take 5,000 mcg by mouth daily. 30 tablet   . cholecalciferol (VITAMIN D3) 25 MCG (1000 UT) tablet Take 2,000 Units by mouth daily.    Marland Kitchen ezetimibe (ZETIA) 10 MG tablet Take 10 mg by mouth at bedtime.    . finasteride (PROSCAR) 5 MG tablet Take 2.5 mg by mouth at bedtime.    Marland Kitchen levothyroxine (SYNTHROID) 88 MCG tablet Take 88 mcg by mouth daily before breakfast.    . lidocaine-prilocaine (EMLA) cream Apply to affected area once 30 g 3  . LORazepam (ATIVAN) 0.5 MG tablet Take 1 tablet (0.5 mg total) by mouth every 6 (six) hours as needed for sleep. 30 tablet 0  . Methylcellulose, Laxative, (CITRUCEL) 500 MG TABS Take 500 mg by mouth daily.    . ondansetron (ZOFRAN) 8 MG tablet Take 1 tablet  (8 mg total) by mouth 2 (two) times daily as needed. Start on the third day after chemotherapy. 30 tablet 1  . pantoprazole (PROTONIX) 40 MG tablet Take 40 mg by mouth 2 (two) times daily before a meal.    . Polyethyl Glycol-Propyl Glycol (SYSTANE) 0.4-0.3 % SOLN Place 1 drop into both eyes at bedtime as needed (dry eyes).    . prochlorperazine (COMPAZINE) 10 MG tablet TAKE ONE TABLET ('10MG'$  TOTAL) BY MOUTH EVERY SIX HOURS AS NEEDED FOR NAUSEA OR VOMITING 30 tablet 1  . telmisartan (MICARDIS) 40 MG tablet Take 40 mg by mouth daily.    . traMADol (ULTRAM) 50 MG tablet Take 2 tablets (100 mg total) by mouth every 6 (six) hours as needed. 5 tablet 0   No current facility-administered medications for this visit.     PHYSICAL EXAMINATION: ECOG PERFORMANCE STATUS: 1 - Symptomatic but completely ambulatory  Vitals:   02/09/19 1347  BP: 116/71  Pulse: 99  Resp: 17  Temp: 98.5 F (36.9 C)  SpO2: 99%   Filed Weights   02/09/19 1347  Weight: 159 lb 1.6 oz (72.2 kg)    GENERAL: alert, no distress and comfortable SKIN: skin color, texture, turgor are normal, no rashes or significant lesions EYES:  normal, Conjunctiva are pink and non-injected, sclera clear OROPHARYNX: no exudate, no erythema and lips, buccal mucosa, and tongue normal  NECK: supple, thyroid normal size, non-tender, without nodularity LYMPH: no palpable lymphadenopathy in the cervical, axillary or inguinal LUNGS: clear to auscultation and percussion with normal breathing effort HEART: regular rate & rhythm and no murmurs and no lower extremity edema ABDOMEN: abdomen soft, non-tender and normal bowel sounds MUSCULOSKELETAL: no cyanosis of digits and no clubbing  NEURO: alert & oriented x 3 with fluent speech, no focal motor/sensory deficits EXTREMITIES: No lower extremity edema  LABORATORY DATA:  I have reviewed the data as listed CMP Latest Ref Rng & Units 02/02/2019 01/25/2019 01/17/2019  Glucose 70 - 99 mg/dL 114(H) 154(H)  125(H)  BUN 8 - 23 mg/dL 23 18 24(H)  Creatinine 0.44 - 1.00 mg/dL 0.89 0.96 0.86  Sodium 135 - 145 mmol/L 138 142 141  Potassium 3.5 - 5.1 mmol/L 3.9 3.7 4.7  Chloride 98 - 111 mmol/L 104 109 106  CO2 22 - 32 mmol/L '26 22 25  '$ Calcium 8.9 - 10.3 mg/dL 8.7(L) 8.7(L) 8.6(L)  Total Protein 6.5 - 8.1 g/dL 6.2(L) 6.4(L) 6.4(L)  Total Bilirubin 0.3 - 1.2 mg/dL 0.6 0.4 0.6  Alkaline Phos 38 - 126 U/L 53 75 137(H)  AST 15 - 41 U/L 15 12(L) 15  ALT 0 - 44 U/L '10 6 7    '$ Lab Results  Component Value Date   WBC 1.8 (L) 02/09/2019   HGB 8.5 (L) 02/09/2019   HCT 24.8 (L) 02/09/2019   MCV 96.5 02/09/2019   PLT 50 (L) 02/09/2019   NEUTROABS 0.7 (L) 02/09/2019    ASSESSMENT & PLAN:  Malignant neoplasm of upper-outer quadrant of left breast in female, estrogen receptor negative ([place]) 11/08/2018: Routine screening mammogram detected 2 masses in the left breast, measuring 1.6cm and 1.1cm, spanning a total of 3cm. Biopsy confirmed IDC with DCIS, HER-2 negative by FISH (2+ by IHC), ER/PR negative, Ki67 80%.  Staging: T2N0 stage IIb clinical stage Breast MRI: 11/18/2018: 2.9 cm bilobed mass UOQ left breast  Treatment plan 1.Neoadjuvant chemotherapy with dose dense Adriamycin and Cytoxan x4 followed by Taxol and carboplatin 2.Breast conserving surgery with sentinel lymph node biopsy 2.Followed by adjuvant radiation -------------------------------------------------------------------------------------------------- Current treatment:Completed 4 cycles of dose denseAdriamycin andCytoxan, received 1 cycle of Taxol with carboplatin, discontinuing further chemo Echocardiogram 11/26/2018: EF 60 to 65% Labs reviewed  Chemo toxicities: 1.Chemo induced anemia: Hemoglobin 7.9 with severe symptoms of palpitations, dizziness, shortness of breath and hypotension.  Received 1 unit of PRBC.  Today's hemoglobin is 8.5 2.Severe fatigue 3.Alopecia 4.    Thrombocytopenia: Platelet count 50 secondary to  carboplatin  Counseling: I discussed with her that given the degree of myelosuppression, we decided to discontinue all further chemotherapy. I placed an order for breast MRI and I also sent a message to Dr. [name] to see her for discussion regarding surgical options. We will present her in the tumor board after the MRI.  Return to clinic in 2 weeks with recheck of her labs and to review the breast MRI.  Based on toxicities, I recommended discontinuing Carbo and keeping her on weekly Taxol. Monitoring closely for toxicities.    No orders of the defined types were placed in this encounter.  The patient has a good understanding of the overall plan. she agrees with it. she will call with any problems that may develop before the next visit here.  Nicholas Lose, MD 02/09/2019  Julious Oka Dorshimer am  acting as scribe for Dr. Nicholas Lose.  I have reviewed the above documentation for accuracy and completeness, and I agree with the above.

## 2019-02-09 ENCOUNTER — Ambulatory Visit: Payer: Medicare Other

## 2019-02-09 ENCOUNTER — Inpatient Hospital Stay: Payer: Medicare Other

## 2019-02-09 ENCOUNTER — Inpatient Hospital Stay (HOSPITAL_BASED_OUTPATIENT_CLINIC_OR_DEPARTMENT_OTHER): Payer: Medicare Other | Admitting: Hematology and Oncology

## 2019-02-09 ENCOUNTER — Other Ambulatory Visit: Payer: Medicare Other

## 2019-02-09 ENCOUNTER — Encounter: Payer: Self-pay | Admitting: Hematology and Oncology

## 2019-02-09 ENCOUNTER — Other Ambulatory Visit: Payer: Self-pay

## 2019-02-09 ENCOUNTER — Encounter: Payer: Self-pay | Admitting: *Deleted

## 2019-02-09 VITALS — BP 116/71 | HR 99 | Temp 98.5°F | Resp 17 | Ht 66.0 in | Wt 159.1 lb

## 2019-02-09 DIAGNOSIS — Z79899 Other long term (current) drug therapy: Secondary | ICD-10-CM | POA: Diagnosis not present

## 2019-02-09 DIAGNOSIS — D696 Thrombocytopenia, unspecified: Secondary | ICD-10-CM | POA: Diagnosis not present

## 2019-02-09 DIAGNOSIS — R002 Palpitations: Secondary | ICD-10-CM | POA: Diagnosis not present

## 2019-02-09 DIAGNOSIS — D6481 Anemia due to antineoplastic chemotherapy: Secondary | ICD-10-CM | POA: Diagnosis not present

## 2019-02-09 DIAGNOSIS — Z171 Estrogen receptor negative status [ER-]: Secondary | ICD-10-CM

## 2019-02-09 DIAGNOSIS — C50412 Malignant neoplasm of upper-outer quadrant of left female breast: Secondary | ICD-10-CM

## 2019-02-09 DIAGNOSIS — Z803 Family history of malignant neoplasm of breast: Secondary | ICD-10-CM | POA: Diagnosis not present

## 2019-02-09 DIAGNOSIS — Z95828 Presence of other vascular implants and grafts: Secondary | ICD-10-CM

## 2019-02-09 DIAGNOSIS — R5383 Other fatigue: Secondary | ICD-10-CM | POA: Diagnosis not present

## 2019-02-09 DIAGNOSIS — R42 Dizziness and giddiness: Secondary | ICD-10-CM | POA: Diagnosis not present

## 2019-02-09 DIAGNOSIS — Z5111 Encounter for antineoplastic chemotherapy: Secondary | ICD-10-CM | POA: Diagnosis not present

## 2019-02-09 LAB — CBC WITH DIFFERENTIAL (CANCER CENTER ONLY)
Abs Immature Granulocytes: 0.01 10*3/uL (ref 0.00–0.07)
Basophils Absolute: 0 10*3/uL (ref 0.0–0.1)
Basophils Relative: 1 %
Eosinophils Absolute: 0 10*3/uL (ref 0.0–0.5)
Eosinophils Relative: 2 %
HCT: 24.8 % — ABNORMAL LOW (ref 36.0–46.0)
Hemoglobin: 8.5 g/dL — ABNORMAL LOW (ref 12.0–15.0)
Immature Granulocytes: 1 %
Lymphocytes Relative: 33 %
Lymphs Abs: 0.6 10*3/uL — ABNORMAL LOW (ref 0.7–4.0)
MCH: 33.1 pg (ref 26.0–34.0)
MCHC: 34.3 g/dL (ref 30.0–36.0)
MCV: 96.5 fL (ref 80.0–100.0)
Monocytes Absolute: 0.4 10*3/uL (ref 0.1–1.0)
Monocytes Relative: 23 %
Neutro Abs: 0.7 10*3/uL — ABNORMAL LOW (ref 1.7–7.7)
Neutrophils Relative %: 40 %
Platelet Count: 50 10*3/uL — ABNORMAL LOW (ref 150–400)
RBC: 2.57 MIL/uL — ABNORMAL LOW (ref 3.87–5.11)
RDW: 15.9 % — ABNORMAL HIGH (ref 11.5–15.5)
WBC Count: 1.8 10*3/uL — ABNORMAL LOW (ref 4.0–10.5)
nRBC: 0 % (ref 0.0–0.2)

## 2019-02-09 LAB — CMP (CANCER CENTER ONLY)
ALT: 10 U/L (ref 0–44)
AST: 16 U/L (ref 15–41)
Albumin: 3.9 g/dL (ref 3.5–5.0)
Alkaline Phosphatase: 58 U/L (ref 38–126)
Anion gap: 10 (ref 5–15)
BUN: 24 mg/dL — ABNORMAL HIGH (ref 8–23)
CO2: 25 mmol/L (ref 22–32)
Calcium: 8.5 mg/dL — ABNORMAL LOW (ref 8.9–10.3)
Chloride: 106 mmol/L (ref 98–111)
Creatinine: 0.88 mg/dL (ref 0.44–1.00)
GFR, Est AFR Am: >60 mL/min (ref >60)
GFR, Estimated: >60 mL/min (ref >60)
Glucose, Bld: 122 mg/dL — ABNORMAL HIGH (ref 70–99)
Potassium: 4.2 mmol/L (ref 3.5–5.1)
Sodium: 141 mmol/L (ref 135–145)
Total Bilirubin: 0.6 mg/dL (ref 0.3–1.2)
Total Protein: 6.2 g/dL — ABNORMAL LOW (ref 6.5–8.1)

## 2019-02-09 MED ORDER — SODIUM CHLORIDE 0.9% FLUSH
10.0000 mL | Freq: Once | INTRAVENOUS | Status: AC
  Administered 2019-02-09: 10 mL
  Filled 2019-02-09: qty 10

## 2019-02-09 MED ORDER — HEPARIN SOD (PORK) LOCK FLUSH 100 UNIT/ML IV SOLN
500.0000 [IU] | Freq: Once | INTRAVENOUS | Status: AC
  Administered 2019-02-09: 14:00:00 500 [IU]
  Filled 2019-02-09: qty 5

## 2019-02-09 NOTE — Assessment & Plan Note (Signed)
11/08/2018: Routine screening mammogram detected 2 masses in the left breast, measuring 1.6cm and 1.1cm, spanning a total of 3cm. Biopsy confirmed IDC with DCIS, Carol Byrd-2 negative by FISH (2+ by IHC), ER/PR negative, Ki67 80%.  Staging: T2N0 stage IIb clinical stage Breast MRI: 11/18/2018: 2.9 cm bilobed mass UOQ left breast  Treatment plan 1.Neoadjuvant chemotherapy with dose dense Adriamycin and Cytoxan x4 followed by Taxol and carboplatin 2.Breast conserving surgery with sentinel lymph node biopsy 2.Followed by adjuvant radiation -------------------------------------------------------------------------------------------------- Current treatment:Completed 4 cycles of dose denseAdriamycin andCytoxan, today cycle 1 of Taxol given with carboplatin Echocardiogram 11/26/2018: EF 60 to 65% Labs reviewed  Chemo toxicities: 1.Chemo induced anemia: Hemoglobin 7.9 with severe symptoms of palpitations, dizziness, shortness of breath and hypotension.  Received 1 unit of PRBC. 2.Severe fatigue 3.Alopecia 4.  Palpitations due to anemia  Based on toxicities, I recommended discontinuing Carbo and keeping Carol Byrd on weekly Taxol. Monitoring closely for toxicities.

## 2019-02-13 ENCOUNTER — Encounter: Payer: Self-pay | Admitting: Hematology and Oncology

## 2019-02-14 DIAGNOSIS — D61818 Other pancytopenia: Secondary | ICD-10-CM | POA: Diagnosis not present

## 2019-02-14 DIAGNOSIS — C50912 Malignant neoplasm of unspecified site of left female breast: Secondary | ICD-10-CM | POA: Diagnosis not present

## 2019-02-15 ENCOUNTER — Other Ambulatory Visit: Payer: Self-pay

## 2019-02-15 DIAGNOSIS — C50412 Malignant neoplasm of upper-outer quadrant of left female breast: Secondary | ICD-10-CM

## 2019-02-15 DIAGNOSIS — Z171 Estrogen receptor negative status [ER-]: Secondary | ICD-10-CM

## 2019-02-15 DIAGNOSIS — D649 Anemia, unspecified: Secondary | ICD-10-CM

## 2019-02-15 DIAGNOSIS — Z91018 Allergy to other foods: Secondary | ICD-10-CM

## 2019-02-16 ENCOUNTER — Ambulatory Visit: Payer: Medicare Other | Admitting: Adult Health

## 2019-02-16 ENCOUNTER — Ambulatory Visit
Admission: RE | Admit: 2019-02-16 | Discharge: 2019-02-16 | Disposition: A | Discharge status: Home / Self Care | Payer: Medicare Other | Source: Ambulatory Visit | Attending: Hematology and Oncology | Admitting: Hematology and Oncology

## 2019-02-16 ENCOUNTER — Other Ambulatory Visit: Payer: Self-pay

## 2019-02-16 ENCOUNTER — Other Ambulatory Visit: Payer: Medicare Other

## 2019-02-16 ENCOUNTER — Inpatient Hospital Stay: Payer: Medicare Other

## 2019-02-16 ENCOUNTER — Ambulatory Visit: Payer: Medicare Other

## 2019-02-16 DIAGNOSIS — C50412 Malignant neoplasm of upper-outer quadrant of left female breast: Secondary | ICD-10-CM

## 2019-02-16 DIAGNOSIS — R5383 Other fatigue: Secondary | ICD-10-CM | POA: Diagnosis not present

## 2019-02-16 DIAGNOSIS — R002 Palpitations: Secondary | ICD-10-CM | POA: Diagnosis not present

## 2019-02-16 DIAGNOSIS — D696 Thrombocytopenia, unspecified: Secondary | ICD-10-CM | POA: Diagnosis not present

## 2019-02-16 DIAGNOSIS — Z171 Estrogen receptor negative status [ER-]: Secondary | ICD-10-CM | POA: Diagnosis not present

## 2019-02-16 DIAGNOSIS — Z803 Family history of malignant neoplasm of breast: Secondary | ICD-10-CM | POA: Diagnosis not present

## 2019-02-16 DIAGNOSIS — R42 Dizziness and giddiness: Secondary | ICD-10-CM | POA: Diagnosis not present

## 2019-02-16 DIAGNOSIS — D6481 Anemia due to antineoplastic chemotherapy: Secondary | ICD-10-CM | POA: Diagnosis not present

## 2019-02-16 DIAGNOSIS — Z853 Personal history of malignant neoplasm of breast: Secondary | ICD-10-CM | POA: Diagnosis not present

## 2019-02-16 DIAGNOSIS — Z5111 Encounter for antineoplastic chemotherapy: Secondary | ICD-10-CM | POA: Diagnosis not present

## 2019-02-16 DIAGNOSIS — Z79899 Other long term (current) drug therapy: Secondary | ICD-10-CM | POA: Diagnosis not present

## 2019-02-16 LAB — CMP (CANCER CENTER ONLY)
ALT: 9 U/L (ref 0–44)
AST: 15 U/L (ref 15–41)
Albumin: 3.9 g/dL (ref 3.5–5.0)
Alkaline Phosphatase: 54 U/L (ref 38–126)
Anion gap: 10 (ref 5–15)
BUN: 23 mg/dL (ref 8–23)
CO2: 24 mmol/L (ref 22–32)
Calcium: 8.8 mg/dL — ABNORMAL LOW (ref 8.9–10.3)
Chloride: 106 mmol/L (ref 98–111)
Creatinine: 0.89 mg/dL (ref 0.44–1.00)
GFR, Est AFR Am: >60 mL/min (ref >60)
GFR, Estimated: >60 mL/min (ref >60)
Glucose, Bld: 102 mg/dL — ABNORMAL HIGH (ref 70–99)
Potassium: 4.3 mmol/L (ref 3.5–5.1)
Sodium: 140 mmol/L (ref 135–145)
Total Bilirubin: 0.6 mg/dL (ref 0.3–1.2)
Total Protein: 6.4 g/dL — ABNORMAL LOW (ref 6.5–8.1)

## 2019-02-16 LAB — CBC WITH DIFFERENTIAL (CANCER CENTER ONLY)
Abs Immature Granulocytes: 0.01 10*3/uL (ref 0.00–0.07)
Basophils Absolute: 0 10*3/uL (ref 0.0–0.1)
Basophils Relative: 0 %
Eosinophils Absolute: 0.1 10*3/uL (ref 0.0–0.5)
Eosinophils Relative: 2 %
HCT: 24.3 % — ABNORMAL LOW (ref 36.0–46.0)
Hemoglobin: 8.3 g/dL — ABNORMAL LOW (ref 12.0–15.0)
Immature Granulocytes: 0 %
Lymphocytes Relative: 26 %
Lymphs Abs: 0.7 10*3/uL (ref 0.7–4.0)
MCH: 34 pg (ref 26.0–34.0)
MCHC: 34.2 g/dL (ref 30.0–36.0)
MCV: 99.6 fL (ref 80.0–100.0)
Monocytes Absolute: 0.4 10*3/uL (ref 0.1–1.0)
Monocytes Relative: 13 %
Neutro Abs: 1.6 10*3/uL — ABNORMAL LOW (ref 1.7–7.7)
Neutrophils Relative %: 59 %
Platelet Count: 50 10*3/uL — ABNORMAL LOW (ref 150–400)
RBC: 2.44 MIL/uL — ABNORMAL LOW (ref 3.87–5.11)
RDW: 18 % — ABNORMAL HIGH (ref 11.5–15.5)
WBC Count: 2.8 10*3/uL — ABNORMAL LOW (ref 4.0–10.5)
nRBC: 0 % (ref 0.0–0.2)

## 2019-02-16 MED ORDER — GADOBUTROL 1 MMOL/ML IV SOLN
8.0000 mL | Freq: Once | INTRAVENOUS | Status: AC | PRN
  Administered 2019-02-16: 8 mL via INTRAVENOUS

## 2019-02-17 ENCOUNTER — Other Ambulatory Visit: Payer: Self-pay | Admitting: General Surgery

## 2019-02-17 DIAGNOSIS — C50412 Malignant neoplasm of upper-outer quadrant of left female breast: Secondary | ICD-10-CM | POA: Diagnosis not present

## 2019-02-17 DIAGNOSIS — Z171 Estrogen receptor negative status [ER-]: Secondary | ICD-10-CM

## 2019-02-18 ENCOUNTER — Encounter: Payer: Self-pay | Admitting: *Deleted

## 2019-02-18 ENCOUNTER — Other Ambulatory Visit: Payer: Self-pay | Admitting: General Surgery

## 2019-02-18 DIAGNOSIS — C50412 Malignant neoplasm of upper-outer quadrant of left female breast: Secondary | ICD-10-CM

## 2019-02-20 ENCOUNTER — Encounter: Payer: Self-pay | Admitting: Hematology and Oncology

## 2019-02-21 ENCOUNTER — Telehealth: Payer: Self-pay | Admitting: Hematology and Oncology

## 2019-02-21 NOTE — Telephone Encounter (Signed)
Scheduled appt per 10/2 sch message- pt to get an updated schedule next visit.

## 2019-02-22 NOTE — Progress Notes (Signed)
Patient Care Team: [name], MD as PCP - General (Internal Medicine) Satira Sark, MD as PCP - Cardiology (Cardiology)  DIAGNOSIS:    ICD-10-CM   1. Malignant neoplasm of upper-outer quadrant of left breast in female, estrogen receptor negative ([place])  C50.412    Z17.1     SUMMARY OF ONCOLOGIC HISTORY: Oncology History  Malignant neoplasm of upper-outer quadrant of left breast in female, estrogen receptor negative ([place])  11/08/2018 Initial Diagnosis   Routine screening mammogram detected 2 masses in the left breast, measuring 1.6cm and 1.1cm, spanning a total of 3cm. Biopsy confirmed IDC with DCIS, HER-2 negative by FISH (2+ by IHC), ER/PR negative, Ki67 80%.    11/18/2018 Breast MRI   2.9 centimeter bilobed mass in the UOQ of the left breast, consistent with known malignancy. 0.5 centimeter satellite nodule is 0.5 centimeters below this mass.   11/22/2018 Cancer Staging   Staging form: Breast, AJCC 8th Edition - Clinical stage from 11/22/2018: Stage IIB (cT2, cN0, cM0, G3, ER-, PR-, HER2-) - Signed by Nicholas Lose, MD on 11/22/2018   11/27/2018 Genetic Testing   Patient had genetic testing done for a personal and family history of breast cancer.  Results were negative on the [facility] Breast cancer STAT gene panel. The STAT Breast cancer panel offered by [facility] includes sequencing and rearrangement analysis for the following 7 genes:  BRCA1, BRCA2, CDH1, PALB2, PTEN, STK11 and TP53.  The report date is 11/27/2018.   12/01/2018 - 01/25/2019 Chemotherapy   The patient had DOXOrubicin (ADRIAMYCIN) chemo injection 114 mg, 60 mg/m2 = 114 mg, Intravenous,  Once, 4 of 4 cycles Dose modification: 50 mg/m2 (original dose 60 mg/m2, Cycle 2, Reason: Dose not tolerated), 40 mg/m2 (original dose 60 mg/m2, Cycle 4, Reason: Dose not tolerated) Administration: 114 mg (12/01/2018), 96 mg (12/15/2018), 96 mg (12/29/2018), 76 mg (01/12/2019) palonosetron (ALOXI) injection 0.25 mg, 0.25 mg, Intravenous,   Once, 5 of 8 cycles Administration: 0.25 mg (12/01/2018), 0.25 mg (01/25/2019), 0.25 mg (12/15/2018), 0.25 mg (12/29/2018), 0.25 mg (01/12/2019) pegfilgrastim-jmdb (FULPHILA) injection 6 mg, 6 mg, Subcutaneous,  Once, 4 of 4 cycles Administration: 6 mg (12/03/2018), 6 mg (12/17/2018), 6 mg (12/31/2018), 6 mg (01/14/2019) CARBOplatin (PARAPLATIN) 510 mg in sodium chloride 0.9 % 250 mL chemo infusion, 510 mg (106.9 % of original dose 478.8 mg), Intravenous,  Once, 1 of 4 cycles Dose modification:   (original dose 478.8 mg, Cycle 5) Administration: 510 mg (01/25/2019) cyclophosphamide (CYTOXAN) 1,140 mg in sodium chloride 0.9 % 250 mL chemo infusion, 600 mg/m2 = 1,140 mg, Intravenous,  Once, 4 of 4 cycles Dose modification: 500 mg/m2 (original dose 600 mg/m2, Cycle 2, Reason: Dose not tolerated), 400 mg/m2 (original dose 600 mg/m2, Cycle 4, Reason: Dose not tolerated) Administration: 1,140 mg (12/01/2018), 960 mg (12/15/2018), 960 mg (12/29/2018), 760 mg (01/12/2019) PACLitaxel (TAXOL) 150 mg in sodium chloride 0.9 % 250 mL chemo infusion (</= 48m/m2), 80 mg/m2 = 150 mg, Intravenous,  Once, 1 of 4 cycles Administration: 150 mg (01/25/2019) fosaprepitant (EMEND) 150 mg, dexamethasone (DECADRON) 12 mg in sodium chloride 0.9 % 145 mL IVPB, , Intravenous,  Once, 5 of 8 cycles Administration:  (12/01/2018),  (01/25/2019),  (12/15/2018),  (12/29/2018),  (01/12/2019)  for chemotherapy treatment.      CHIEF COMPLIANT: Follow-up to review MRI  INTERVAL HISTORY: Carol DOROUGHis a 73y.o. with above-mentioned history of left breast cancer who completed neoadjuvant chemotherapy with 4 cycles of Adriamycin and Cytoxan and1 cycle of Taxol and Carboplatin. Breast  MRI on 02/16/19 showed near resolution of both sites of biopsy proven malignancies in the left breast with a 0.3cm area of residual enhancement. She presents to the clinic today to review her MRI.   REVIEW OF SYSTEMS:   Constitutional: Denies fevers, chills or abnormal  weight loss Eyes: Denies blurriness of vision Ears, nose, mouth, throat, and face: Denies mucositis or sore throat Respiratory: Denies cough, dyspnea or wheezes Cardiovascular: Denies palpitation, chest discomfort Gastrointestinal: Denies nausea, heartburn or change in bowel habits Skin: Denies abnormal skin rashes Lymphatics: Denies new lymphadenopathy or easy bruising Neurological: Denies numbness, tingling or new weaknesses Behavioral/Psych: Mood is stable, no new changes  Extremities: No lower extremity edema Breast: denies any pain or lumps or nodules in either breasts All other systems were reviewed with the patient and are negative.  I have reviewed the past medical history, past surgical history, social history and family history with the patient and they are unchanged from previous note.  ALLERGIES:  is allergic to alpha-gal.  MEDICATIONS:  Current Outpatient Medications  Medication Sig Dispense Refill  . atorvastatin (LIPITOR) 20 MG tablet Take 20 mg by mouth See admin instructions. Takes in the evening on Tuesdays only    . Biotin 5000 MCG TABS Take 5,000 mcg by mouth daily. 30 tablet   . cholecalciferol (VITAMIN D3) 25 MCG (1000 UT) tablet Take 2,000 Units by mouth daily.    Marland Kitchen ezetimibe (ZETIA) 10 MG tablet Take 10 mg by mouth at bedtime.    . finasteride (PROSCAR) 5 MG tablet Take 2.5 mg by mouth at bedtime.    Marland Kitchen levothyroxine (SYNTHROID) 88 MCG tablet Take 88 mcg by mouth daily before breakfast.    . Methylcellulose, Laxative, (CITRUCEL) 500 MG TABS Take 500 mg by mouth daily.    . pantoprazole (PROTONIX) 40 MG tablet Take 40 mg by mouth 2 (two) times daily before a meal.    . Polyethyl Glycol-Propyl Glycol (SYSTANE) 0.4-0.3 % SOLN Place 1 drop into both eyes at bedtime as needed (dry eyes).    Marland Kitchen telmisartan (MICARDIS) 40 MG tablet Take 40 mg by mouth daily.    . traMADol (ULTRAM) 50 MG tablet Take 2 tablets (100 mg total) by mouth every 6 (six) hours as needed. 5 tablet  0   No current facility-administered medications for this visit.     PHYSICAL EXAMINATION: ECOG PERFORMANCE STATUS: 1 - Symptomatic but completely ambulatory  Vitals:   02/23/19 1057  BP: 136/64  Pulse: 99  Resp: 18  Temp: (!) 97.4 F (36.3 C)  SpO2: 98%   Filed Weights   02/23/19 1057  Weight: 157 lb (71.2 kg)    GENERAL: alert, no distress and comfortable SKIN: skin color, texture, turgor are normal, no rashes or significant lesions EYES: normal, Conjunctiva are pink and non-injected, sclera clear OROPHARYNX: no exudate, no erythema and lips, buccal mucosa, and tongue normal  NECK: supple, thyroid normal size, non-tender, without nodularity LYMPH: no palpable lymphadenopathy in the cervical, axillary or inguinal LUNGS: clear to auscultation and percussion with normal breathing effort HEART: regular rate & rhythm and no murmurs and no lower extremity edema ABDOMEN: abdomen soft, non-tender and normal bowel sounds MUSCULOSKELETAL: no cyanosis of digits and no clubbing  NEURO: alert & oriented x 3 with fluent speech, no focal motor/sensory deficits EXTREMITIES: No lower extremity edema  LABORATORY DATA:  I have reviewed the data as listed CMP Latest Ref Rng & Units 02/16/2019 02/09/2019 02/02/2019  Glucose 70 - 99 mg/dL 102(H) 122(H)  114(H)  BUN 8 - 23 mg/dL 23 24(H) 23  Creatinine 0.44 - 1.00 mg/dL 0.89 0.88 0.89  Sodium 135 - 145 mmol/L 140 141 138  Potassium 3.5 - 5.1 mmol/L 4.3 4.2 3.9  Chloride 98 - 111 mmol/L 106 106 104  CO2 22 - 32 mmol/L _0 Calcium 8.9 - 10.3 mg/dL 8.8(L) 8.5(L) 8.7(L)  Total Protein 6.5 - 8.1 g/dL 6.4(L) 6.2(L) 6.2(L)  Total Bilirubin 0.3 - 1.2 mg/dL 0.6 0.6 0.6  Alkaline Phos 38 - 126 U/L 54 58 53  AST 15 - 41 U/L _1 ALT 0 - 44 U/L _2 Lab Results  Component Value Date   WBC 2.6 (L) 02/23/2019   HGB 8.6 (L) 02/23/2019   HCT 25.3 (L) 02/23/2019   MCV 102.4 (H) 02/23/2019   PLT 155 02/23/2019   NEUTROABS 1.6 (L)  02/23/2019    ASSESSMENT & PLAN:  Malignant neoplasm of upper-outer quadrant of left breast in female, estrogen receptor negative ([place]) 11/08/2018: Routine screening mammogram detected 2 masses in the left breast, measuring 1.6cm and 1.1cm, spanning a total of 3cm. Biopsy confirmed IDC with DCIS, HER-2 negative by FISH (2+ by IHC), ER/PR negative, Ki67 80%.  Staging: T2N0 stage IIb clinical stage Breast MRI: 11/18/2018: 2.9 cm bilobed mass UOQ left breast  Treatment plan 1.Neoadjuvant chemotherapy with dose dense Adriamycin and Cytoxan x4 followed by Taxol and carboplatin 2.Breast conserving surgery with sentinel lymph node biopsy 2.Followed by adjuvant radiation -------------------------------------------------------------------------------------------------- Current treatment:Completed 4 cycles of dose denseAdriamycin andCytoxan, received 1 cycle of Taxol with carboplatin, discontinuing further chemo  Breast MRI 02/16/2019: Near complete resolution of the 2 biopsied lesions, 3 mm residual enhancement satellite nodule has resolved.  We discussed the case in tumor board and she will undergo breast conserving surgery.  No further plan for chemo so the port can be removed. She will receive adjuvant radiation subsequently.   No orders of the defined types were placed in this encounter.  The patient has a good understanding of the overall plan. she agrees with it. she will call with any problems that may develop before the next visit here.  Nicholas Lose, MD 02/23/2019  Carol Byrd am acting as scribe for Dr. Nicholas Lose.  I have reviewed the above documentation for accuracy and completeness, and I agree with the above.

## 2019-02-23 ENCOUNTER — Ambulatory Visit: Payer: Medicare Other

## 2019-02-23 ENCOUNTER — Other Ambulatory Visit: Payer: Medicare Other

## 2019-02-23 ENCOUNTER — Inpatient Hospital Stay: Payer: Medicare Other | Attending: Hematology and Oncology

## 2019-02-23 ENCOUNTER — Other Ambulatory Visit: Payer: Self-pay

## 2019-02-23 ENCOUNTER — Inpatient Hospital Stay (HOSPITAL_BASED_OUTPATIENT_CLINIC_OR_DEPARTMENT_OTHER): Payer: Medicare Other | Admitting: Hematology and Oncology

## 2019-02-23 ENCOUNTER — Inpatient Hospital Stay: Payer: Medicare Other

## 2019-02-23 DIAGNOSIS — C50412 Malignant neoplasm of upper-outer quadrant of left female breast: Secondary | ICD-10-CM

## 2019-02-23 DIAGNOSIS — Z171 Estrogen receptor negative status [ER-]: Secondary | ICD-10-CM

## 2019-02-23 DIAGNOSIS — Z9221 Personal history of antineoplastic chemotherapy: Secondary | ICD-10-CM | POA: Diagnosis not present

## 2019-02-23 DIAGNOSIS — Z95828 Presence of other vascular implants and grafts: Secondary | ICD-10-CM

## 2019-02-23 LAB — CMP (CANCER CENTER ONLY)
ALT: 13 U/L (ref 0–44)
AST: 16 U/L (ref 15–41)
Albumin: 3.9 g/dL (ref 3.5–5.0)
Alkaline Phosphatase: 56 U/L (ref 38–126)
Anion gap: 10 (ref 5–15)
BUN: 19 mg/dL (ref 8–23)
CO2: 24 mmol/L (ref 22–32)
Calcium: 8.7 mg/dL — ABNORMAL LOW (ref 8.9–10.3)
Chloride: 107 mmol/L (ref 98–111)
Creatinine: 0.92 mg/dL (ref 0.44–1.00)
GFR, Est AFR Am: >60 mL/min (ref >60)
GFR, Estimated: >60 mL/min (ref >60)
Glucose, Bld: 116 mg/dL — ABNORMAL HIGH (ref 70–99)
Potassium: 3.9 mmol/L (ref 3.5–5.1)
Sodium: 141 mmol/L (ref 135–145)
Total Bilirubin: 0.6 mg/dL (ref 0.3–1.2)
Total Protein: 6.7 g/dL (ref 6.5–8.1)

## 2019-02-23 LAB — CBC WITH DIFFERENTIAL (CANCER CENTER ONLY)
Abs Immature Granulocytes: 0 10*3/uL (ref 0.00–0.07)
Basophils Absolute: 0 10*3/uL (ref 0.0–0.1)
Basophils Relative: 1 %
Eosinophils Absolute: 0.1 10*3/uL (ref 0.0–0.5)
Eosinophils Relative: 2 %
HCT: 25.3 % — ABNORMAL LOW (ref 36.0–46.0)
Hemoglobin: 8.6 g/dL — ABNORMAL LOW (ref 12.0–15.0)
Immature Granulocytes: 0 %
Lymphocytes Relative: 22 %
Lymphs Abs: 0.6 10*3/uL — ABNORMAL LOW (ref 0.7–4.0)
MCH: 34.8 pg — ABNORMAL HIGH (ref 26.0–34.0)
MCHC: 34 g/dL (ref 30.0–36.0)
MCV: 102.4 fL — ABNORMAL HIGH (ref 80.0–100.0)
Monocytes Absolute: 0.3 10*3/uL (ref 0.1–1.0)
Monocytes Relative: 13 %
Neutro Abs: 1.6 10*3/uL — ABNORMAL LOW (ref 1.7–7.7)
Neutrophils Relative %: 62 %
Platelet Count: 155 10*3/uL (ref 150–400)
RBC: 2.47 MIL/uL — ABNORMAL LOW (ref 3.87–5.11)
RDW: 19.7 % — ABNORMAL HIGH (ref 11.5–15.5)
WBC Count: 2.6 10*3/uL — ABNORMAL LOW (ref 4.0–10.5)
nRBC: 0 % (ref 0.0–0.2)

## 2019-02-23 MED ORDER — HEPARIN SOD (PORK) LOCK FLUSH 100 UNIT/ML IV SOLN
500.0000 [IU] | Freq: Once | INTRAVENOUS | Status: AC
  Administered 2019-02-23: 500 [IU]
  Filled 2019-02-23: qty 5

## 2019-02-23 MED ORDER — SODIUM CHLORIDE 0.9% FLUSH
10.0000 mL | Freq: Once | INTRAVENOUS | Status: AC
  Administered 2019-02-23: 11:00:00 10 mL
  Filled 2019-02-23: qty 10

## 2019-02-23 NOTE — Assessment & Plan Note (Signed)
11/08/2018: Routine screening mammogram detected 2 masses in the left breast, measuring 1.6cm and 1.1cm, spanning a total of 3cm. Biopsy confirmed IDC with DCIS, HER-2 negative by FISH (2+ by IHC), ER/PR negative, Ki67 80%.  Staging: T2N0 stage IIb clinical stage Breast MRI: 11/18/2018: 2.9 cm bilobed mass UOQ left breast  Treatment plan 1.Neoadjuvant chemotherapy with dose dense Adriamycin and Cytoxan x4 followed by Taxol and carboplatin 2.Breast conserving surgery with sentinel lymph node biopsy 2.Followed by adjuvant radiation -------------------------------------------------------------------------------------------------- Current treatment:Completed 4 cycles of dose denseAdriamycin andCytoxan, received 1 cycle of Taxol with carboplatin, discontinuing further chemo  Breast MRI 02/16/2019: Near complete resolution of the 2 biopsied lesions, 3 mm residual enhancement satellite nodule has resolved.  We discussed the case in tumor board and she will undergo breast conserving surgery.  No further plan for chemo so the port can be removed. She will receive adjuvant radiation subsequently.

## 2019-02-24 ENCOUNTER — Telehealth: Payer: Self-pay | Admitting: Hematology and Oncology

## 2019-02-24 NOTE — Telephone Encounter (Signed)
Per 10/7 los - 11/17 f/u - appt already scheduled for 11/19 - left appt as is - no additional appts added.

## 2019-03-02 ENCOUNTER — Ambulatory Visit: Payer: Medicare Other

## 2019-03-02 ENCOUNTER — Ambulatory Visit: Payer: Medicare Other | Admitting: Hematology and Oncology

## 2019-03-02 ENCOUNTER — Other Ambulatory Visit: Payer: Medicare Other

## 2019-03-05 ENCOUNTER — Encounter: Payer: Self-pay | Admitting: Hematology and Oncology

## 2019-03-06 ENCOUNTER — Encounter: Payer: Self-pay | Admitting: Hematology and Oncology

## 2019-03-14 ENCOUNTER — Inpatient Hospital Stay: Payer: Medicare Other

## 2019-03-14 ENCOUNTER — Other Ambulatory Visit: Payer: Self-pay

## 2019-03-14 DIAGNOSIS — C50412 Malignant neoplasm of upper-outer quadrant of left female breast: Secondary | ICD-10-CM | POA: Diagnosis not present

## 2019-03-14 DIAGNOSIS — Z9221 Personal history of antineoplastic chemotherapy: Secondary | ICD-10-CM | POA: Diagnosis not present

## 2019-03-14 DIAGNOSIS — Z171 Estrogen receptor negative status [ER-]: Secondary | ICD-10-CM | POA: Diagnosis not present

## 2019-03-14 DIAGNOSIS — Z23 Encounter for immunization: Secondary | ICD-10-CM | POA: Diagnosis not present

## 2019-03-14 LAB — CMP (CANCER CENTER ONLY)
ALT: 14 U/L (ref 0–44)
AST: 16 U/L (ref 15–41)
Albumin: 3.9 g/dL (ref 3.5–5.0)
Alkaline Phosphatase: 57 U/L (ref 38–126)
Anion gap: 13 (ref 5–15)
BUN: 27 mg/dL — ABNORMAL HIGH (ref 8–23)
CO2: 24 mmol/L (ref 22–32)
Calcium: 9.2 mg/dL (ref 8.9–10.3)
Chloride: 105 mmol/L (ref 98–111)
Creatinine: 1.19 mg/dL — ABNORMAL HIGH (ref 0.44–1.00)
GFR, Est AFR Am: 52 mL/min — ABNORMAL LOW (ref >60)
GFR, Estimated: 45 mL/min — ABNORMAL LOW (ref >60)
Glucose, Bld: 85 mg/dL (ref 70–99)
Potassium: 4.4 mmol/L (ref 3.5–5.1)
Sodium: 142 mmol/L (ref 135–145)
Total Bilirubin: 0.5 mg/dL (ref 0.3–1.2)
Total Protein: 7 g/dL (ref 6.5–8.1)

## 2019-03-14 LAB — CBC WITH DIFFERENTIAL (CANCER CENTER ONLY)
Abs Immature Granulocytes: 0.02 10*3/uL (ref 0.00–0.07)
Basophils Absolute: 0 10*3/uL (ref 0.0–0.1)
Basophils Relative: 1 %
Eosinophils Absolute: 0.2 10*3/uL (ref 0.0–0.5)
Eosinophils Relative: 6 %
HCT: 29.4 % — ABNORMAL LOW (ref 36.0–46.0)
Hemoglobin: 9.9 g/dL — ABNORMAL LOW (ref 12.0–15.0)
Immature Granulocytes: 1 %
Lymphocytes Relative: 22 %
Lymphs Abs: 0.9 10*3/uL (ref 0.7–4.0)
MCH: 36 pg — ABNORMAL HIGH (ref 26.0–34.0)
MCHC: 33.7 g/dL (ref 30.0–36.0)
MCV: 106.9 fL — ABNORMAL HIGH (ref 80.0–100.0)
Monocytes Absolute: 0.5 10*3/uL (ref 0.1–1.0)
Monocytes Relative: 12 %
Neutro Abs: 2.4 10*3/uL (ref 1.7–7.7)
Neutrophils Relative %: 58 %
Platelet Count: 192 10*3/uL (ref 150–400)
RBC: 2.75 MIL/uL — ABNORMAL LOW (ref 3.87–5.11)
RDW: 15.5 % (ref 11.5–15.5)
WBC Count: 4.1 10*3/uL (ref 4.0–10.5)
nRBC: 0 % (ref 0.0–0.2)

## 2019-03-15 ENCOUNTER — Encounter: Payer: Self-pay | Admitting: Hematology and Oncology

## 2019-03-20 HISTORY — PX: BREAST LUMPECTOMY: SHX2

## 2019-03-22 ENCOUNTER — Encounter (HOSPITAL_BASED_OUTPATIENT_CLINIC_OR_DEPARTMENT_OTHER): Payer: Self-pay | Admitting: *Deleted

## 2019-03-22 ENCOUNTER — Other Ambulatory Visit: Payer: Self-pay

## 2019-03-23 DIAGNOSIS — Z91018 Allergy to other foods: Secondary | ICD-10-CM | POA: Diagnosis not present

## 2019-03-24 NOTE — Progress Notes (Signed)
Pt called back with questions regarding the meds she should take for the dos and what to stop. After reviewing the notes from pre op phone completed by Tammy, RN, I reinforced what meds to take dos. Pt stated her understanding. Instructed to call back for any further questions or concerns.

## 2019-03-25 ENCOUNTER — Other Ambulatory Visit (HOSPITAL_COMMUNITY)
Admission: RE | Admit: 2019-03-25 | Discharge: 2019-03-25 | Disposition: A | Discharge status: Home / Self Care | Payer: Medicare Other | Source: Ambulatory Visit | Attending: General Surgery | Admitting: General Surgery

## 2019-03-25 DIAGNOSIS — Z01812 Encounter for preprocedural laboratory examination: Secondary | ICD-10-CM | POA: Insufficient documentation

## 2019-03-25 DIAGNOSIS — Z20828 Contact with and (suspected) exposure to other viral communicable diseases: Secondary | ICD-10-CM | POA: Insufficient documentation

## 2019-03-25 LAB — SARS CORONAVIRUS 2 (TAT 6-24 HRS): SARS Coronavirus 2: NEGATIVE

## 2019-03-25 NOTE — Progress Notes (Signed)

## 2019-03-28 ENCOUNTER — Ambulatory Visit
Admission: RE | Admit: 2019-03-28 | Discharge: 2019-03-28 | Disposition: A | Discharge status: Home / Self Care | Payer: Medicare Other | Source: Ambulatory Visit | Attending: General Surgery | Admitting: General Surgery

## 2019-03-28 ENCOUNTER — Other Ambulatory Visit: Payer: Self-pay

## 2019-03-28 DIAGNOSIS — C50411 Malignant neoplasm of upper-outer quadrant of right female breast: Secondary | ICD-10-CM | POA: Diagnosis not present

## 2019-03-28 DIAGNOSIS — C50412 Malignant neoplasm of upper-outer quadrant of left female breast: Secondary | ICD-10-CM

## 2019-03-28 DIAGNOSIS — Z171 Estrogen receptor negative status [ER-]: Secondary | ICD-10-CM

## 2019-03-29 ENCOUNTER — Other Ambulatory Visit: Payer: Self-pay

## 2019-03-29 ENCOUNTER — Ambulatory Visit (HOSPITAL_BASED_OUTPATIENT_CLINIC_OR_DEPARTMENT_OTHER): Payer: Medicare Other | Admitting: Anesthesiology

## 2019-03-29 ENCOUNTER — Ambulatory Visit (HOSPITAL_BASED_OUTPATIENT_CLINIC_OR_DEPARTMENT_OTHER)
Admission: RE | Admit: 2019-03-29 | Discharge: 2019-03-29 | Disposition: A | Discharge status: Home / Self Care | Payer: Medicare Other | Attending: General Surgery | Admitting: General Surgery

## 2019-03-29 ENCOUNTER — Encounter (HOSPITAL_COMMUNITY)
Admission: RE | Admit: 2019-03-29 | Discharge: 2019-03-29 | Disposition: A | Discharge status: Home / Self Care | Payer: Medicare Other | Source: Ambulatory Visit | Attending: General Surgery | Admitting: General Surgery

## 2019-03-29 ENCOUNTER — Ambulatory Visit
Admission: RE | Admit: 2019-03-29 | Discharge: 2019-03-29 | Disposition: A | Discharge status: Home / Self Care | Payer: Medicare Other | Source: Ambulatory Visit | Attending: General Surgery | Admitting: General Surgery

## 2019-03-29 ENCOUNTER — Encounter (HOSPITAL_BASED_OUTPATIENT_CLINIC_OR_DEPARTMENT_OTHER)
Admission: RE | Disposition: A | Discharge status: Home / Self Care | Payer: Self-pay | Source: Home / Self Care | Attending: General Surgery

## 2019-03-29 ENCOUNTER — Encounter (HOSPITAL_BASED_OUTPATIENT_CLINIC_OR_DEPARTMENT_OTHER): Payer: Self-pay

## 2019-03-29 DIAGNOSIS — Z809 Family history of malignant neoplasm, unspecified: Secondary | ICD-10-CM | POA: Insufficient documentation

## 2019-03-29 DIAGNOSIS — I10 Essential (primary) hypertension: Secondary | ICD-10-CM | POA: Diagnosis not present

## 2019-03-29 DIAGNOSIS — C50912 Malignant neoplasm of unspecified site of left female breast: Secondary | ICD-10-CM | POA: Diagnosis not present

## 2019-03-29 DIAGNOSIS — D0512 Intraductal carcinoma in situ of left breast: Secondary | ICD-10-CM | POA: Insufficient documentation

## 2019-03-29 DIAGNOSIS — Z171 Estrogen receptor negative status [ER-]: Secondary | ICD-10-CM | POA: Insufficient documentation

## 2019-03-29 DIAGNOSIS — C50411 Malignant neoplasm of upper-outer quadrant of right female breast: Secondary | ICD-10-CM | POA: Diagnosis not present

## 2019-03-29 DIAGNOSIS — Z87891 Personal history of nicotine dependence: Secondary | ICD-10-CM | POA: Insufficient documentation

## 2019-03-29 DIAGNOSIS — Z8249 Family history of ischemic heart disease and other diseases of the circulatory system: Secondary | ICD-10-CM | POA: Diagnosis not present

## 2019-03-29 DIAGNOSIS — Z79899 Other long term (current) drug therapy: Secondary | ICD-10-CM | POA: Diagnosis not present

## 2019-03-29 DIAGNOSIS — C50412 Malignant neoplasm of upper-outer quadrant of left female breast: Secondary | ICD-10-CM

## 2019-03-29 DIAGNOSIS — Z8049 Family history of malignant neoplasm of other genital organs: Secondary | ICD-10-CM | POA: Insufficient documentation

## 2019-03-29 DIAGNOSIS — Z9221 Personal history of antineoplastic chemotherapy: Secondary | ICD-10-CM | POA: Diagnosis not present

## 2019-03-29 DIAGNOSIS — G8918 Other acute postprocedural pain: Secondary | ICD-10-CM | POA: Diagnosis not present

## 2019-03-29 DIAGNOSIS — E039 Hypothyroidism, unspecified: Secondary | ICD-10-CM | POA: Diagnosis not present

## 2019-03-29 HISTORY — PX: PORT-A-CATH REMOVAL: SHX5289

## 2019-03-29 HISTORY — PX: BREAST LUMPECTOMY WITH RADIOACTIVE SEED AND SENTINEL LYMPH NODE BIOPSY: SHX6550

## 2019-03-29 HISTORY — DX: Gastro-esophageal reflux disease without esophagitis: K21.9

## 2019-03-29 SURGERY — BREAST LUMPECTOMY WITH RADIOACTIVE SEED AND SENTINEL LYMPH NODE BIOPSY
Anesthesia: Regional | Site: Chest | Laterality: Right

## 2019-03-29 MED ORDER — GABAPENTIN 100 MG PO CAPS
100.0000 mg | ORAL_CAPSULE | ORAL | Status: AC
  Administered 2019-03-29: 07:00:00 100 mg via ORAL

## 2019-03-29 MED ORDER — PROPOFOL 10 MG/ML IV BOLUS
INTRAVENOUS | Status: AC
  Filled 2019-03-29: qty 40

## 2019-03-29 MED ORDER — FENTANYL CITRATE (PF) 100 MCG/2ML IJ SOLN
50.0000 ug | INTRAMUSCULAR | Status: DC | PRN
  Administered 2019-03-29 (×2): 50 ug via INTRAVENOUS

## 2019-03-29 MED ORDER — OXYCODONE HCL 5 MG PO TABS: 5.0000 mg | ORAL_TABLET | Freq: Four times a day (QID) | ORAL | 0 refills | Status: DC | PRN

## 2019-03-29 MED ORDER — CEFAZOLIN SODIUM-DEXTROSE 2-3 GM-%(50ML) IV SOLR
INTRAVENOUS | Status: DC | PRN
  Administered 2019-03-29: 2 g via INTRAVENOUS

## 2019-03-29 MED ORDER — OXYCODONE HCL 5 MG PO TABS
ORAL_TABLET | ORAL | Status: AC
  Filled 2019-03-29: qty 1

## 2019-03-29 MED ORDER — FENTANYL CITRATE (PF) 100 MCG/2ML IJ SOLN
25.0000 ug | INTRAMUSCULAR | Status: DC | PRN
  Administered 2019-03-29: 25 ug via INTRAVENOUS
  Administered 2019-03-29: 10:00:00 50 ug via INTRAVENOUS
  Administered 2019-03-29: 25 ug via INTRAVENOUS

## 2019-03-29 MED ORDER — TECHNETIUM TC 99M SULFUR COLLOID FILTERED
1.0000 | Freq: Once | INTRAVENOUS | Status: AC | PRN
  Administered 2019-03-29: 1 via INTRADERMAL

## 2019-03-29 MED ORDER — FENTANYL CITRATE (PF) 100 MCG/2ML IJ SOLN
INTRAMUSCULAR | Status: AC
  Filled 2019-03-29: qty 2

## 2019-03-29 MED ORDER — GABAPENTIN 100 MG PO CAPS
ORAL_CAPSULE | ORAL | Status: AC
  Filled 2019-03-29: qty 1

## 2019-03-29 MED ORDER — DEXAMETHASONE SODIUM PHOSPHATE 10 MG/ML IJ SOLN
INTRAMUSCULAR | Status: DC | PRN
  Administered 2019-03-29: 5 mg via INTRAVENOUS

## 2019-03-29 MED ORDER — MIDAZOLAM HCL 2 MG/2ML IJ SOLN
INTRAMUSCULAR | Status: AC
  Filled 2019-03-29: qty 2

## 2019-03-29 MED ORDER — CLONIDINE HCL (ANALGESIA) 100 MCG/ML EP SOLN
EPIDURAL | Status: DC | PRN
  Administered 2019-03-29: 100 ug

## 2019-03-29 MED ORDER — ACETAMINOPHEN 500 MG PO TABS
1000.0000 mg | ORAL_TABLET | ORAL | Status: AC
  Administered 2019-03-29: 1000 mg via ORAL

## 2019-03-29 MED ORDER — ONDANSETRON HCL 4 MG/2ML IJ SOLN
INTRAMUSCULAR | Status: DC | PRN
  Administered 2019-03-29: 4 mg via INTRAVENOUS

## 2019-03-29 MED ORDER — SODIUM CHLORIDE (PF) 0.9 % IJ SOLN
INTRAVENOUS | Status: DC | PRN
  Administered 2019-03-29: 09:00:00 5 mL via INTRAMUSCULAR

## 2019-03-29 MED ORDER — ROPIVACAINE HCL 5 MG/ML IJ SOLN
INTRAMUSCULAR | Status: DC | PRN
  Administered 2019-03-29: 30 mL via PERINEURAL

## 2019-03-29 MED ORDER — MIDAZOLAM HCL 2 MG/2ML IJ SOLN
1.0000 mg | INTRAMUSCULAR | Status: DC | PRN
  Administered 2019-03-29: 08:00:00 2 mg via INTRAVENOUS

## 2019-03-29 MED ORDER — LIDOCAINE 2% (20 MG/ML) 5 ML SYRINGE
INTRAMUSCULAR | Status: AC
  Filled 2019-03-29: qty 5

## 2019-03-29 MED ORDER — CEFAZOLIN SODIUM-DEXTROSE 2-4 GM/100ML-% IV SOLN: 2.0000 g | INTRAVENOUS | Status: DC

## 2019-03-29 MED ORDER — LACTATED RINGERS IV SOLN
INTRAVENOUS | Status: DC
  Administered 2019-03-29: 08:00:00 via INTRAVENOUS

## 2019-03-29 MED ORDER — PROPOFOL 10 MG/ML IV BOLUS
INTRAVENOUS | Status: DC | PRN
  Administered 2019-03-29: 100 mg via INTRAVENOUS

## 2019-03-29 MED ORDER — CHLORHEXIDINE GLUCONATE CLOTH 2 % EX PADS: 6.0000 | MEDICATED_PAD | Freq: Once | CUTANEOUS | Status: DC

## 2019-03-29 MED ORDER — ONDANSETRON HCL 4 MG/2ML IJ SOLN
INTRAMUSCULAR | Status: AC
  Filled 2019-03-29: qty 2

## 2019-03-29 MED ORDER — OXYCODONE HCL 5 MG PO TABS
5.0000 mg | ORAL_TABLET | Freq: Once | ORAL | Status: AC
  Administered 2019-03-29: 5 mg via ORAL

## 2019-03-29 MED ORDER — ACETAMINOPHEN 500 MG PO TABS
ORAL_TABLET | ORAL | Status: AC
  Filled 2019-03-29: qty 2

## 2019-03-29 MED ORDER — CEFAZOLIN SODIUM-DEXTROSE 2-4 GM/100ML-% IV SOLN
INTRAVENOUS | Status: AC
  Filled 2019-03-29: qty 100

## 2019-03-29 MED ORDER — DEXAMETHASONE SODIUM PHOSPHATE 10 MG/ML IJ SOLN
INTRAMUSCULAR | Status: AC
  Filled 2019-03-29: qty 1

## 2019-03-29 MED ORDER — ENSURE PRE-SURGERY PO LIQD: 296.0000 mL | Freq: Once | ORAL | Status: DC

## 2019-03-29 MED ORDER — BUPIVACAINE HCL (PF) 0.25 % IJ SOLN
INTRAMUSCULAR | Status: DC | PRN
  Administered 2019-03-29: 8 mL

## 2019-03-29 SURGICAL SUPPLY — 64 items
ADH SKN CLS APL DERMABOND .7 (GAUZE/BANDAGES/DRESSINGS) ×2
APL PRP STRL LF DISP 70% ISPRP (MISCELLANEOUS) ×2
APPLIER CLIP 9.375 MED OPEN (MISCELLANEOUS) ×3
APR CLP MED 9.3 20 MLT OPN (MISCELLANEOUS) ×2
BINDER BREAST LRG (GAUZE/BANDAGES/DRESSINGS) IMPLANT
BINDER BREAST MEDIUM (GAUZE/BANDAGES/DRESSINGS) IMPLANT
BINDER BREAST XLRG (GAUZE/BANDAGES/DRESSINGS) ×1 IMPLANT
BINDER BREAST XXLRG (GAUZE/BANDAGES/DRESSINGS) IMPLANT
BLADE SURG 15 STRL LF DISP TIS (BLADE) ×2 IMPLANT
BLADE SURG 15 STRL SS (BLADE) ×3
CANISTER SUC SOCK COL 7IN (MISCELLANEOUS) IMPLANT
CANISTER SUCT 1200ML W/VALVE (MISCELLANEOUS) IMPLANT
CHLORAPREP W/TINT 26 (MISCELLANEOUS) ×3 IMPLANT
CLIP APPLIE 9.375 MED OPEN (MISCELLANEOUS) IMPLANT
CLIP VESOCCLUDE SM WIDE 6/CT (CLIP) ×2 IMPLANT
COVER BACK TABLE REUSABLE LG (DRAPES) ×3 IMPLANT
COVER MAYO STAND REUSABLE (DRAPES) ×3 IMPLANT
COVER PROBE W GEL 5X96 (DRAPES) ×3 IMPLANT
COVER WAND RF STERILE (DRAPES) IMPLANT
DECANTER SPIKE VIAL GLASS SM (MISCELLANEOUS) ×3 IMPLANT
DERMABOND ADVANCED (GAUZE/BANDAGES/DRESSINGS) ×1
DERMABOND ADVANCED .7 DNX12 (GAUZE/BANDAGES/DRESSINGS) ×2 IMPLANT
DRAPE LAPAROSCOPIC ABDOMINAL (DRAPES) ×3 IMPLANT
DRAPE LAPAROTOMY 100X72 PEDS (DRAPES) ×2 IMPLANT
DRAPE UTILITY XL STRL (DRAPES) ×3 IMPLANT
ELECT COATED BLADE 2.86 ST (ELECTRODE) ×3 IMPLANT
ELECT REM PT RETURN 9FT ADLT (ELECTROSURGICAL) ×3
ELECTRODE REM PT RTRN 9FT ADLT (ELECTROSURGICAL) ×2 IMPLANT
GLOVE BIO SURGEON STRL SZ7 (GLOVE) ×6 IMPLANT
GLOVE BIOGEL PI IND STRL 6.5 (GLOVE) IMPLANT
GLOVE BIOGEL PI IND STRL 7.5 (GLOVE) ×2 IMPLANT
GLOVE BIOGEL PI INDICATOR 6.5 (GLOVE) ×1
GLOVE BIOGEL PI INDICATOR 7.5 (GLOVE) ×1
GLOVE ECLIPSE 6.5 STRL STRAW (GLOVE) ×1 IMPLANT
GOWN STRL REUS W/ TWL LRG LVL3 (GOWN DISPOSABLE) ×4 IMPLANT
GOWN STRL REUS W/TWL LRG LVL3 (GOWN DISPOSABLE) ×6
HEMOSTAT ARISTA ABSORB 3G PWDR (HEMOSTASIS) IMPLANT
ILLUMINATOR WAVEGUIDE N/F (MISCELLANEOUS) ×1 IMPLANT
KIT MARKER MARGIN INK (KITS) ×3 IMPLANT
LIGHT WAVEGUIDE WIDE FLAT (MISCELLANEOUS) IMPLANT
NDL HYPO 25X1 1.5 SAFETY (NEEDLE) ×2 IMPLANT
NDL SAFETY ECLIPSE 18X1.5 (NEEDLE) IMPLANT
NEEDLE HYPO 18GX1.5 SHARP (NEEDLE)
NEEDLE HYPO 25X1 1.5 SAFETY (NEEDLE) ×3 IMPLANT
NS IRRIG 1000ML POUR BTL (IV SOLUTION) ×1 IMPLANT
PACK BASIN DAY SURGERY FS (CUSTOM PROCEDURE TRAY) ×3 IMPLANT
PENCIL SMOKE EVACUATOR (MISCELLANEOUS) ×3 IMPLANT
SLEEVE SCD COMPRESS KNEE MED (MISCELLANEOUS) ×3 IMPLANT
SPONGE LAP 4X18 RFD (DISPOSABLE) ×3 IMPLANT
STRIP CLOSURE SKIN 1/2X4 (GAUZE/BANDAGES/DRESSINGS) ×3 IMPLANT
SUT ETHILON 2 0 FS 18 (SUTURE) IMPLANT
SUT MNCRL AB 4-0 PS2 18 (SUTURE) ×4 IMPLANT
SUT MON AB 5-0 PS2 18 (SUTURE) IMPLANT
SUT SILK 2 0 SH (SUTURE) IMPLANT
SUT VIC AB 2-0 SH 27 (SUTURE) ×9
SUT VIC AB 2-0 SH 27XBRD (SUTURE) ×2 IMPLANT
SUT VIC AB 3-0 SH 27 (SUTURE) ×6
SUT VIC AB 3-0 SH 27X BRD (SUTURE) ×2 IMPLANT
SUT VIC AB 5-0 PS2 18 (SUTURE) IMPLANT
SYR CONTROL 10ML LL (SYRINGE) ×3 IMPLANT
TOWEL GREEN STERILE FF (TOWEL DISPOSABLE) ×3 IMPLANT
TRAY FAXITRON CT DISP (TRAY / TRAY PROCEDURE) ×3 IMPLANT
TUBE CONNECTING 20X1/4 (TUBING) IMPLANT
YANKAUER SUCT BULB TIP NO VENT (SUCTIONS) IMPLANT

## 2019-03-29 NOTE — Anesthesia Preprocedure Evaluation (Addendum)
Anesthesia Evaluation  Patient identified by MRN, date of birth, ID band Patient awake    Reviewed: Allergy & Precautions, NPO status , Patient's Chart, lab work & pertinent test results  Airway Mallampati: I  TM Distance: >3 FB Neck ROM: Full    Dental no notable dental hx. (+) Teeth Intact, Dental Advisory Given   Pulmonary neg pulmonary ROS, former smoker,    Pulmonary exam normal breath sounds clear to auscultation       Cardiovascular hypertension, Pt. on medications Normal cardiovascular exam Rhythm:Regular Rate:Normal  HLD  TTE 11/2018 Normal LVEF, no significant valvular abnormalities   Neuro/Psych negative neurological ROS  negative psych ROS   GI/Hepatic Neg liver ROS, GERD  Medicated and Controlled,  Endo/Other  Hypothyroidism   Renal/GU negative Renal ROS  negative genitourinary   Musculoskeletal negative musculoskeletal ROS (+)   Abdominal   Peds  Hematology negative hematology ROS (+)   Anesthesia Other Findings   Reproductive/Obstetrics                           Anesthesia Physical Anesthesia Plan  ASA: II  Anesthesia Plan: General and Regional   Post-op Pain Management:  Regional for Post-op pain   Induction: Intravenous  PONV Risk Score and Plan: 3 and Ondansetron, Dexamethasone and Midazolam  Airway Management Planned: LMA  Additional Equipment:   Intra-op Plan:   Post-operative Plan: Extubation in OR  Informed Consent: I have reviewed the patients History and Physical, chart, labs and discussed the procedure including the risks, benefits and alternatives for the proposed anesthesia with the patient or authorized representative who has indicated his/her understanding and acceptance.     Dental advisory given  Plan Discussed with: CRNA  Anesthesia Plan Comments:         Anesthesia Quick Evaluation

## 2019-03-29 NOTE — Anesthesia Postprocedure Evaluation (Signed)
Anesthesia Post Note  Patient: Carol Byrd  Procedure(s) Performed: LEFT BREAST LUMPECTOMY WITH RADIOACTIVE SEED AND LEFT AXILLARY SENTINEL LYMPH NODE BIOPSY BLUE DYE INJECTION (Left Breast) REMOVAL PORT-A-CATH (Right Chest)     Patient location during evaluation: PACU Anesthesia Type: Regional and General Level of consciousness: awake and alert Pain management: pain level controlled Vital Signs Assessment: post-procedure vital signs reviewed and stable Respiratory status: spontaneous breathing, nonlabored ventilation, respiratory function stable and patient connected to nasal cannula oxygen Cardiovascular status: blood pressure returned to baseline and stable Postop Assessment: no apparent nausea or vomiting Anesthetic complications: no    Last Vitals:  Vitals:   03/29/19 1015 03/29/19 1045  BP:  120/67  Pulse: 70 84  Resp: 13 16  Temp:  37.1 C  SpO2: 100% 98%    Last Pain:  Vitals:   03/29/19 1045  TempSrc:   PainSc: 3                  Jasara Corrigan L Genny Caulder

## 2019-03-29 NOTE — H&P (Signed)
73 yo healthy female referred by Dr Willey Blade for new left breast cancer. Carol Byrd has no personal history of breast disease. Carol Byrd has fh in her sister who was first diagnosed at age 76 and then had another bout recently. Carol Byrd states her genetic testing was negative (not sure exactly what type). Carol Byrd has another maternal aunt with breast cancer. Carol Byrd has no mass or dc. Carol Byrd underwent screening mm that shows c density breasts. there was left breast mass in uoq on screening. dx views showed 2 adjacent masses measuring 2.9 cm total. US shows 2 masses that are next to each other measuring 1.6cm and 1.1 cm with 3 cm total area. the axilla is negative. her biopsy shows a grade III IDC that is er/pr negative, her 2 negative and Ki is 80%. genetics were negative. Carol Byrd has done chemo but had to stop due to anemia. Carol Byrd returns today doing we. hb is 8.3 and platelets are 50. wbc increasing now to 2.8. her mri shows near complete resolution of the left breast mass  Past Surgical History  Breast Biopsy  Left. Spinal Surgery - Lower Back  Thyroid Surgery   Allergies  No Known Drug Allergies  Allergies Reconciled   Medication History  Atorvastatin Calcium (20MG  Tablet, Oral) Active. Ezetimibe (10MG  Tablet, Oral) Active. Finasteride (5MG  Tablet, Oral) Active. Levothyroxine Sodium (88MCG Capsule, Oral) Active. Telmisartan (40MG  Tablet, Oral) Active. Citrucel (500MG  Tablet, Oral) Active. Prochlorperazine Maleate (10MG  Tablet, Oral) Active. Medications Reconciled  Social History  Alcohol use  Moderate alcohol use. Caffeine use  Coffee. No drug use  Tobacco use  Former smoker.  Family History  bca  Sister. Cervical Cancer  Mother. Heart disease in female family member before age 25  Hypertension  Father, Mother, Sister. Melanoma  Sister.   Physical Exam  General Mental Status-Alert. Orientation-Oriented X3. Breast Nipples-No Discharge. Breast Lump-No Palpable Breast  Mass. Lymphatic Head & Neck General Head & Neck Lymphatics: Bilateral - Description - Normal. Axillary General Axillary Region: Bilateral - Description - Normal. Note: no [place] adenopathy   Assessment & Plan Hiram Comber MD; 02/17/2019 10:03 AM) BREAST CANCER OF UPPER-OUTER QUADRANT OF LEFT FEMALE BREAST (C50.412) Story: Left breast seed guided lumpectomy, left axillary sn biopsy, port removal We discussed a sentinel lymph node biopsy as Carol Byrd does not appear to having lymph node involvement right now. We discussed the performance of that with injection of radioactive tracer and blue dye. We discussed that there is a chance of having a positive node with a sentinel lymph node biopsy and we will await the permanent pathology to make any other first further decisions in terms of her treatment. We discussed up to a 5% risk lifetime of chronic shoulder pain as well as lymphedema associated with a sentinel lymph node biopsy. We discussed the options for treatment of the breast cancer which included lumpectomy versus a mastectomy. We discussed the performance of the lumpectomy with radioactive seed placement. We discussed a 5-10% chance of a positive margin requiring reexcision in the operating room. We also discussed that Carol Byrd will need radiation therapy if Carol Byrd undergoes lumpectomy. The breast cannot undergo more radiation therapy in the same breast after lumpectomy in the future. We discussed mastectomy and the postoperative care for that as well. Mastectomy can be followed by reconstruction. The decision for lumpectomy vs mastectomy has no impact on decision for chemotherapy. Most mastectomy patients will not need radiation therapy. We discussed that there is no difference in her survival whether Carol Byrd undergoes lumpectomy with radiation  therapy or antiestrogen therapy versus a mastectomy. There is also no real difference between her recurrence in the breast. discussed surgery risks and recovery. will confirm  oncology ok with port removal and follow labs to ensure ready for surgery in about 4 weeks

## 2019-03-29 NOTE — Anesthesia Procedure Notes (Addendum)
Anesthesia Regional Block: Pectoralis block   Pre-Anesthetic Checklist: ,, timeout performed, Correct Patient, Correct Site, Correct Laterality, Correct Procedure, Correct Position, site marked, Risks and benefits discussed,  Surgical consent,  Pre-op evaluation,  At surgeon's request and post-op pain management  Laterality: Left  Prep: Maximum Sterile Barrier Precautions used, chloraprep       Needles:  Injection technique: Single-shot  Needle Type: Echogenic Stimulator Needle     Needle Length: 9cm  Needle Gauge: 22     Additional Needles:   Procedures:,,,, ultrasound used (permanent image in chart),,,,  Narrative:  Start time: 03/29/2019 7:50 AM End time: 03/29/2019 8:00 AM Injection made incrementally with aspirations every 5 mL.  Performed by: Personally  Anesthesiologist: Freddrick March, MD  Additional Notes: Monitors applied. No increased pain on injection. No increased resistance to injection. Injection made in 5cc increments. Good needle visualization. Patient tolerated procedure well.

## 2019-03-29 NOTE — Anesthesia Procedure Notes (Signed)
Procedure Name: LMA Insertion Performed by: Aviyah Swetz M, CRNA Pre-anesthesia Checklist: Patient identified, Emergency Drugs available, Suction available and Patient being monitored Patient Re-evaluated:Patient Re-evaluated prior to induction Oxygen Delivery Method: Circle system utilized Preoxygenation: Pre-oxygenation with 100% oxygen Induction Type: IV induction Ventilation: Mask ventilation without difficulty LMA: LMA inserted LMA Size: 4.0 Number of attempts: 1 Airway Equipment and Method: Bite block Placement Confirmation: positive ETCO2 Tube secured with: Tape Dental Injury: Teeth and Oropharynx as per pre-operative assessment        

## 2019-03-29 NOTE — Progress Notes (Signed)
Nuc med injections completed. Patient tolerated well.   

## 2019-03-29 NOTE — Progress Notes (Signed)
Assisted Dr. Lanetta Inch with left, ultrasound guided, pectoralis block. Side rails up, monitors on throughout procedure. See vital signs in flow sheet. Tolerated Procedure well.

## 2019-03-29 NOTE — Op Note (Signed)
Preoperative diagnosis: Clinical stage II left breast cancer status post primary chemotherapy Postoperative diagnosis: Same as above Procedure: 1.  Injection of blue dye for sentinel lymph node identification 2.  Left breast radioactive seed guided lumpectomy 3.  Left deep axillary sentinel lymph node biopsy Surgeon: Dr. Serita Grammes Anesthesia: General with a pectoral block Complications: None Drains: None Specimens: 1.  Left deep axillary lymph nodes 2.  Left breast radioactive seed guided lumpectomy marked with paint Sponge needle count was correct at completion Disposition recovery stable condition  Indications: This is a 73 year old female who presented with a screening mammogram showing a left breast mass.  There were 2 adjacent masses measuring 2.9 cm in total.  This was a grade 3 triple negative tumor with a high proliferative index.  She has undergone primary chemotherapy but had to stop due to anemia.  By MR at the completion of the chemotherapy that she tolerated she has near complete resolution of the left breast mass.  We discussed her options and decided to proceed with a lumpectomy as well as a sentinel node biopsy.  Oncology has said it is okay to take out her port.  Procedure: The patient first had a radioactive seed placed at the breast center.  I had these mammograms available in the operating room.  After informed consent was obtained she was taken to the operating room.  She was given a pectoral block.  Antibiotics were administered.  SCDs were in place.  She was placed under general anesthesia without complication.  She was prepped and draped in the standard sterile surgical fashion.  Surgical timeout was then performed.  I first infiltrated a mixture of methylene blue dye and saline in the retroareolar position on the left.  I then massaged this.  This was done for later sentinel lymph node identification.  She had previously been administered periareolar  technetium.  I first remove the port.  I infiltrated Marcaine.  I reentered the old incision.  I remove the port, line, and suture material in their entirety.  Hemostasis was observed.  I closed this with 3-0 Vicryl, 4-0 Monocryl, and glue.  I then remove the left breast cancer and the sentinel node.  I did this through a curvilinear incision in the upper outer quadrant.  I made one incision to do both procedures.  I infiltrated Marcaine and made the incision.  I tunneled using the lighted retractor to the seed and the surrounding tissue.  I remove the radioactive seed as well as the surrounding tissue in it with an attempt to get a clear margin.  I then painted the tissue.  I then did a 3D mammogram that appeared that all my margins were clear.  Mammogram confirmed removal of the clips as well as the radioactive seed.  Through the same incision I then entered into the axilla.  I was able to identify several sentinel lymph nodes that were radioactive.  The highest count was 117.  Once I remove these there were no other palpable nodes and no more radioactivity.  The was were passed off the table.  Hemostasis was observed.  I closed the axilla with 2-0 Vicryl suture.  I then placed Clips in the Breast Cavity.  I Then Closed This with 2-0 Vicryl.  The Skin Was Closed with 3-0 Vicryl and 4-0 Monocryl.  Glue and Steri-Strips Were Applied.  She Tolerated This Well Was Extubated and Transferred to Recovery Stable.

## 2019-03-29 NOTE — Discharge Instructions (Signed)
[place] Office Phone Number [phone]  POST OP INSTRUCTIONS Take 400 mg of ibuprofen every 8 hours or 650 mg tylenol every 6 hours for next 72 hours then as needed. Use ice several times daily also. Always review your discharge instruction sheet given to you by the facility where your surgery was performed.  IF YOU HAVE DISABILITY OR FAMILY LEAVE FORMS, YOU MUST BRING THEM TO THE OFFICE FOR PROCESSING.  DO NOT GIVE THEM TO YOUR DOCTOR.  1. A prescription for pain medication may be given to you upon discharge.  Take your pain medication as prescribed, if needed.  If narcotic pain medicine is not needed, then you may take acetaminophen (Tylenol), naprosyn (Alleve) or ibuprofen (Advil) as needed. 2. Take your usually prescribed medications unless otherwise directed 3. If you need a refill on your pain medication, please contact your pharmacy.  They will contact our office to request authorization.  Prescriptions will not be filled after 5pm or on week-ends. 4. You should eat very light the first 24 hours after surgery, such as soup, crackers, pudding, etc.  Resume your normal diet the day after surgery. 5. Most patients will experience some swelling and bruising in the breast.  Ice packs and a good support bra will help.  Wear the breast binder provided or a sports bra for 72 hours day and night.  After that wear a sports bra during the day until you return to the office. Swelling and bruising can take several days to resolve.  6. It is common to experience some constipation if taking pain medication after surgery.  Increasing fluid intake and taking a stool softener will usually help or prevent this problem from occurring.  A mild laxative (Milk of Magnesia or Miralax) should be taken according to package directions if there are no bowel movements after 48 hours. 7. Unless discharge instructions indicate otherwise, you may remove your bandages 48 hours after surgery and you may  shower at that time.  You may have steri-strips (small skin tapes) in place directly over the incision.  These strips should be left on the skin for 7-10 days and will come off on their own.  If your surgeon used skin glue on the incision, you may shower in 24 hours.  The glue will flake off over the next 2-3 weeks.  Any sutures or staples will be removed at the office during your follow-up visit. 8. ACTIVITIES:  You may resume regular daily activities (gradually increasing) beginning the next day.  Wearing a good support bra or sports bra minimizes pain and swelling.  You may have sexual intercourse when it is comfortable. a. You may drive when you no longer are taking prescription pain medication, you can comfortably wear a seatbelt, and you can safely maneuver your car and apply brakes. b. RETURN TO WORK:  ______________________________________________________________________________________ 9. You should see your doctor in the office for a follow-up appointment approximately two weeks after your surgery.  Your doctors nurse will typically make your follow-up appointment when she calls you with your pathology report.  Expect your pathology report 3-4 business days after your surgery.  You may call to check if you do not hear from [place] after three days. 10. OTHER INSTRUCTIONS: _______________________________________________________________________________________________ _____________________________________________________________________________________________________________________________________ _____________________________________________________________________________________________________________________________________ _____________________________________________________________________________________________________________________________________  WHEN TO CALL DR WAKEFIELD: 1. Fever over 101.0 2. Nausea and/or vomiting. 3. Extreme swelling or bruising. 4. Continued bleeding from  incision. 5. Increased pain, redness, or drainage from the incision.  The clinic staff is available to  answer your questions during regular business hours.  Please dont hesitate to call and ask to speak to one of the nurses for clinical concerns.  If you have a medical emergency, go to the nearest emergency room or call 911.  A surgeon from [facility] is always on call at the hospital.  For further questions, please visit [email or web address]    Post Anesthesia Home Care Instructions  Activity: Get plenty of rest for the remainder of the day. A responsible individual must stay with you for 24 hours following the procedure.  For the next 24 hours, DO NOT: -Drive a car -Paediatric nurse -Drink alcoholic beverages -Take any medication unless instructed by your physician -Make any legal decisions or sign important papers.  Meals: Start with liquid foods such as gelatin or soup. Progress to regular foods as tolerated. Avoid greasy, spicy, heavy foods. If nausea and/or vomiting occur, drink only clear liquids until the nausea and/or vomiting subsides. Call your physician if vomiting continues.  Special Instructions/Symptoms: Your throat may feel dry or sore from the anesthesia or the breathing tube placed in your throat during surgery. If this causes discomfort, gargle with warm salt water. The discomfort should disappear within 24 hours.  If you had a scopolamine patch placed behind your ear for the management of post- operative nausea and/or vomiting:  1. The medication in the patch is effective for 72 hours, after which it should be removed.  Wrap patch in a tissue and discard in the trash. Wash hands thoroughly with soap and water. 2. You may remove the patch earlier than 72 hours if you experience unpleasant side effects which may include dry mouth, dizziness or visual disturbances. 3. Avoid touching the patch. Wash your hands with soap and water after contact  with the patch.  No tylenol until after 1:30 today.  Regional Anesthesia Blocks  1. Numbness or the inability to move the "blocked" extremity may last from 3-48 hours after placement. The length of time depends on the medication injected and your individual response to the medication. If the numbness is not going away after 48 hours, call your surgeon.  2. The extremity that is blocked will need to be protected until the numbness is gone and the  Strength has returned. Because you cannot feel it, you will need to take extra care to avoid injury. Because it may be weak, you may have difficulty moving it or using it. You may not know what position it is in without looking at it while the block is in effect.  3. For blocks in the legs and feet, returning to weight bearing and walking needs to be done carefully. You will need to wait until the numbness is entirely gone and the strength has returned. You should be able to move your leg and foot normally before you try and bear weight or walk. You will need someone to be with you when you first try to ensure you do not fall and possibly risk injury.  4. Bruising and tenderness at the needle site are common side effects and will resolve in a few days.  5. Persistent numbness or new problems with movement should be communicated to the surgeon or the [facility] [phone] [place] [phone]).

## 2019-03-29 NOTE — Transfer of Care (Signed)
Immediate Anesthesia Transfer of Care Note  Patient: Carol Byrd  Procedure(s) Performed: LEFT BREAST LUMPECTOMY WITH RADIOACTIVE SEED AND LEFT AXILLARY SENTINEL LYMPH NODE BIOPSY BLUE DYE INJECTION (Left Breast) REMOVAL PORT-A-CATH (Right Chest)  Patient Location: PACU  Anesthesia Type:General and Regional  Level of Consciousness: awake, alert  Airway & Oxygen Therapy: Patient Spontanous Breathing and Patient connected to face mask oxygen  Post-op Assessment: Report given to RN and Post -op Vital signs reviewed and stable  Post vital signs: Reviewed and stable  Last Vitals:  Vitals Value Taken Time  BP    Temp    Pulse 73 03/29/19 0927  Resp 10 03/29/19 0927  SpO2 100 % 03/29/19 0927  Vitals shown include unvalidated device data.  Last Pain:  Vitals:   03/29/19 0715  TempSrc: Oral  PainSc: 0-No pain      Patients Stated Pain Goal: 3 ([id number])  Complications: No apparent anesthesia complications

## 2019-03-29 NOTE — Interval H&P Note (Signed)
History and Physical Interval Note:  03/29/2019 7:55 AM  Carol Byrd  has presented today for surgery, with the diagnosis of left breast cancer.  The various methods of treatment have been discussed with the patient and family. After consideration of risks, benefits and other options for treatment, the patient has consented to  Procedure(s): LEFT BREAST LUMPECTOMY WITH RADIOACTIVE SEED AND LEFT AXILLARY SENTINEL LYMPH NODE BIOPSY BLUE DYE INJECTION (Left) REMOVAL PORT-A-CATH (N/A) as a surgical intervention.  The patient's history has been reviewed, patient examined, no change in status, stable for surgery.  I have reviewed the patient's chart and labs.  Questions were answered to the patient's satisfaction.     Rolm Bookbinder

## 2019-03-30 ENCOUNTER — Encounter (HOSPITAL_BASED_OUTPATIENT_CLINIC_OR_DEPARTMENT_OTHER): Payer: Self-pay | Admitting: General Surgery

## 2019-03-30 LAB — SURGICAL PATHOLOGY

## 2019-03-30 NOTE — Addendum Note (Signed)
Addendum  created 03/30/19 1113 by Tawni Millers, CRNA   Charge Capture section accepted

## 2019-04-04 DIAGNOSIS — Z79899 Other long term (current) drug therapy: Secondary | ICD-10-CM | POA: Diagnosis not present

## 2019-04-04 DIAGNOSIS — R7301 Impaired fasting glucose: Secondary | ICD-10-CM | POA: Diagnosis not present

## 2019-04-04 DIAGNOSIS — E785 Hyperlipidemia, unspecified: Secondary | ICD-10-CM | POA: Diagnosis not present

## 2019-04-04 DIAGNOSIS — E039 Hypothyroidism, unspecified: Secondary | ICD-10-CM | POA: Diagnosis not present

## 2019-04-04 DIAGNOSIS — I10 Essential (primary) hypertension: Secondary | ICD-10-CM | POA: Diagnosis not present

## 2019-04-06 NOTE — Progress Notes (Signed)
Patient Care Team: [name], MD as PCP - General (Internal Medicine) Satira Sark, MD as PCP - Cardiology (Cardiology)  DIAGNOSIS:    ICD-10-CM   1. Malignant neoplasm of upper-outer quadrant of left breast in female, estrogen receptor negative ([place])  C50.412    Z17.1     SUMMARY OF ONCOLOGIC HISTORY: Oncology History  Malignant neoplasm of upper-outer quadrant of left breast in female, estrogen receptor negative ([place])  11/08/2018 Initial Diagnosis   Routine screening mammogram detected 2 masses in the left breast, measuring 1.6cm and 1.1cm, spanning a total of 3cm. Biopsy confirmed IDC with DCIS, HER-2 negative by FISH (2+ by IHC), ER/PR negative, Ki67 80%.    11/18/2018 Breast MRI   2.9 centimeter bilobed mass in the UOQ of the left breast, consistent with known malignancy. 0.5 centimeter satellite nodule is 0.5 centimeters below this mass.   11/22/2018 Cancer Staging   Staging form: Breast, AJCC 8th Edition - Clinical stage from 11/22/2018: Stage IIB (cT2, cN0, cM0, G3, ER-, PR-, HER2-) - Signed by Nicholas Lose, MD on 11/22/2018   11/27/2018 Genetic Testing   Patient had genetic testing done for a personal and family history of breast cancer.  Results were negative on the [facility] Breast cancer STAT gene panel. The STAT Breast cancer panel offered by [facility] includes sequencing and rearrangement analysis for the following 7 genes:  BRCA1, BRCA2, CDH1, PALB2, PTEN, STK11 and TP53.  The report date is 11/27/2018.   12/01/2018 - 01/25/2019 Chemotherapy   The patient had DOXOrubicin (ADRIAMYCIN) chemo injection 114 mg, 60 mg/m2 = 114 mg, Intravenous,  Once, 4 of 4 cycles Dose modification: 50 mg/m2 (original dose 60 mg/m2, Cycle 2, Reason: Dose not tolerated), 40 mg/m2 (original dose 60 mg/m2, Cycle 4, Reason: Dose not tolerated) Administration: 114 mg (12/01/2018), 96 mg (12/15/2018), 96 mg (12/29/2018), 76 mg (01/12/2019) palonosetron (ALOXI) injection 0.25 mg, 0.25 mg, Intravenous,   Once, 5 of 8 cycles Administration: 0.25 mg (12/01/2018), 0.25 mg (01/25/2019), 0.25 mg (12/15/2018), 0.25 mg (12/29/2018), 0.25 mg (01/12/2019) pegfilgrastim-jmdb (FULPHILA) injection 6 mg, 6 mg, Subcutaneous,  Once, 4 of 4 cycles Administration: 6 mg (12/03/2018), 6 mg (12/17/2018), 6 mg (12/31/2018), 6 mg (01/14/2019) CARBOplatin (PARAPLATIN) 510 mg in sodium chloride 0.9 % 250 mL chemo infusion, 510 mg (106.9 % of original dose 478.8 mg), Intravenous,  Once, 1 of 4 cycles Dose modification:   (original dose 478.8 mg, Cycle 5) Administration: 510 mg (01/25/2019) cyclophosphamide (CYTOXAN) 1,140 mg in sodium chloride 0.9 % 250 mL chemo infusion, 600 mg/m2 = 1,140 mg, Intravenous,  Once, 4 of 4 cycles Dose modification: 500 mg/m2 (original dose 600 mg/m2, Cycle 2, Reason: Dose not tolerated), 400 mg/m2 (original dose 600 mg/m2, Cycle 4, Reason: Dose not tolerated) Administration: 1,140 mg (12/01/2018), 960 mg (12/15/2018), 960 mg (12/29/2018), 760 mg (01/12/2019) PACLitaxel (TAXOL) 150 mg in sodium chloride 0.9 % 250 mL chemo infusion (</= 3m/m2), 80 mg/m2 = 150 mg, Intravenous,  Once, 1 of 4 cycles Administration: 150 mg (01/25/2019) fosaprepitant (EMEND) 150 mg, dexamethasone (DECADRON) 12 mg in sodium chloride 0.9 % 145 mL IVPB, , Intravenous,  Once, 5 of 8 cycles Administration:  (12/01/2018),  (01/25/2019),  (12/15/2018),  (12/29/2018),  (01/12/2019)  for chemotherapy treatment.    03/29/2019 Surgery   Left lumpectomy ([name]: scattered microscopic foci of high grade DCIS with no invasive carcinoma, clear margins, 2 left axillary lymph nodes negative.     CHIEF COMPLIANT: Follow-up s/p lumpectomy to review pathology   INTERVAL HISTORY: JTrichelle  Mariama Saintvil is a 73 y.o. with above-mentioned history of left breast cancer who completed neoadjuvant chemotherapy with 4 cycles of Adriamycin and Cytoxan and1 cycle of Taxoland Carboplatin.She underwent a left lumpectomy on 03/29/19 with Dr. [name] for  which pathology showed scattered microscopic foci of high grade DCIS with no invasive carcinoma, clear margins, 2 left axillary lymph nodes negative for carcinoma. She presents to the clinic today to review the pathology report and discuss further treatment.   REVIEW OF SYSTEMS:   Constitutional: Denies fevers, chills or abnormal weight loss Eyes: Denies blurriness of vision Ears, nose, mouth, throat, and face: Denies mucositis or sore throat Respiratory: Denies cough, dyspnea or wheezes Cardiovascular: Denies palpitation, chest discomfort Gastrointestinal: Denies nausea, heartburn or change in bowel habits Skin: Denies abnormal skin rashes Lymphatics: Denies new lymphadenopathy or easy bruising Neurological: Denies numbness, tingling or new weaknesses Behavioral/Psych: Mood is stable, no new changes  Extremities: No lower extremity edema Breast: denies any pain or lumps or nodules in either breasts All other systems were reviewed with the patient and are negative.  I have reviewed the past medical history, past surgical history, social history and family history with the patient and they are unchanged from previous note.  ALLERGIES:  is allergic to alpha-gal.  MEDICATIONS:  Current Outpatient Medications  Medication Sig Dispense Refill  . atorvastatin (LIPITOR) 20 MG tablet Take 20 mg by mouth See admin instructions. Takes in the evening on Tuesdays only    . Biotin 5000 MCG TABS Take 5,000 mcg by mouth daily. 30 tablet   . cholecalciferol (VITAMIN D3) 25 MCG (1000 UT) tablet Take 2,000 Units by mouth daily.    Marland Kitchen ezetimibe (ZETIA) 10 MG tablet Take 10 mg by mouth at bedtime.    . finasteride (PROSCAR) 5 MG tablet Take 2.5 mg by mouth at bedtime.    Marland Kitchen levothyroxine (SYNTHROID) 88 MCG tablet Take 88 mcg by mouth daily before breakfast.    . Methylcellulose, Laxative, (CITRUCEL) 500 MG TABS Take 500 mg by mouth daily.    Marland Kitchen oxyCODONE (OXY IR/ROXICODONE) 5 MG immediate release tablet Take  1 tablet (5 mg total) by mouth every 6 (six) hours as needed for moderate pain, severe pain or breakthrough pain. 10 tablet 0  . pantoprazole (PROTONIX) 40 MG tablet Take 40 mg by mouth 2 (two) times daily before a meal.    . Polyethyl Glycol-Propyl Glycol (SYSTANE) 0.4-0.3 % SOLN Place 1 drop into both eyes at bedtime as needed (dry eyes).    Marland Kitchen telmisartan (MICARDIS) 40 MG tablet Take 40 mg by mouth daily.     No current facility-administered medications for this visit.     PHYSICAL EXAMINATION: ECOG PERFORMANCE STATUS: 1 - Symptomatic but completely ambulatory  Vitals:   04/07/19 1134  BP: 124/80  Pulse: 86  Resp: 17  Temp: 98.2 F (36.8 C)  SpO2: 98%   Filed Weights   04/07/19 1134  Weight: 155 lb 6.4 oz (70.5 kg)    GENERAL: alert, no distress and comfortable SKIN: skin color, texture, turgor are normal, no rashes or significant lesions EYES: normal, Conjunctiva are pink and non-injected, sclera clear OROPHARYNX: no exudate, no erythema and lips, buccal mucosa, and tongue normal  NECK: supple, thyroid normal size, non-tender, without nodularity LYMPH: no palpable lymphadenopathy in the cervical, axillary or inguinal LUNGS: clear to auscultation and percussion with normal breathing effort HEART: regular rate & rhythm and no murmurs and no lower extremity edema ABDOMEN: abdomen soft, non-tender and normal bowel  sounds MUSCULOSKELETAL: no cyanosis of digits and no clubbing  NEURO: alert & oriented x 3 with fluent speech, no focal motor/sensory deficits EXTREMITIES: No lower extremity edema  LABORATORY DATA:  I have reviewed the data as listed CMP Latest Ref Rng & Units 03/14/2019 02/23/2019 02/16/2019  Glucose 70 - 99 mg/dL 85 116(H) 102(H)  BUN 8 - 23 mg/dL 27(H) 19 23  Creatinine 0.44 - 1.00 mg/dL 1.19(H) 0.92 0.89  Sodium 135 - 145 mmol/L 142 141 140  Potassium 3.5 - 5.1 mmol/L 4.4 3.9 4.3  Chloride 98 - 111 mmol/L 105 107 106  CO2 22 - 32 mmol/L _0 Calcium  8.9 - 10.3 mg/dL 9.2 8.7(L) 8.8(L)  Total Protein 6.5 - 8.1 g/dL 7.0 6.7 6.4(L)  Total Bilirubin 0.3 - 1.2 mg/dL 0.5 0.6 0.6  Alkaline Phos 38 - 126 U/L 57 56 54  AST 15 - 41 U/L _1 ALT 0 - 44 U/L _2 Lab Results  Component Value Date   WBC 4.1 03/14/2019   HGB 9.9 (L) 03/14/2019   HCT 29.4 (L) 03/14/2019   MCV 106.9 (H) 03/14/2019   PLT 192 03/14/2019   NEUTROABS 2.4 03/14/2019    ASSESSMENT & PLAN:  Malignant neoplasm of upper-outer quadrant of left breast in female, estrogen receptor negative ([place]) 11/08/2018: Routine screening mammogram detected 2 masses in the left breast, measuring 1.6cm and 1.1cm, spanning a total of 3cm. Biopsy confirmed IDC with DCIS, HER-2 negative by FISH (2+ by IHC), ER/PR negative, Ki67 80%.  Staging: T2N0 stage IIb clinical stage Breast MRI: 11/18/2018: 2.9 cm bilobed mass UOQ left breast  Treatment plan 1.Neoadjuvant chemotherapy with dose dense Adriamycin and Cytoxan x4 followed by Taxol and carboplatin completed 01/25/2019 2.Breast conserving surgery with sentinel lymph node biopsy 03/29/2019 2.Followed by adjuvant radiation -------------------------------------------------------------------------------------------------- Current treatment:Plan is for adjuvant radiation followed by surveillance  05/28/18: Left lumpectomy [name]): scattered microscopic foci of high grade DCIS with no invasive carcinoma, clear margins, 2 left axillary lymph nodes negative.  Pathology counseling: I discussed the final pathology report of the patient provided  a copy of this report. I discussed the margins as well as lymph node surgeries. We also discussed the final staging along with previously performed ER/PR and HER-2/neu testing.  Plan: Adj XRT F/U at the end of radiation.   No orders of the defined types were placed in this encounter.  The patient has a good understanding of the overall plan. she agrees with it. she will call with any  problems that may develop before the next visit here.  Nicholas Lose, MD 04/07/2019  Julious Oka Dorshimer, am acting as scribe for Dr. Nicholas Lose.  I have reviewed the above documentation for accuracy and completeness, and I agree with the above.

## 2019-04-07 ENCOUNTER — Inpatient Hospital Stay: Payer: Medicare Other | Attending: Hematology and Oncology | Admitting: Hematology and Oncology

## 2019-04-07 ENCOUNTER — Other Ambulatory Visit: Payer: Self-pay

## 2019-04-07 ENCOUNTER — Other Ambulatory Visit: Payer: Self-pay | Admitting: *Deleted

## 2019-04-07 DIAGNOSIS — C50412 Malignant neoplasm of upper-outer quadrant of left female breast: Secondary | ICD-10-CM | POA: Diagnosis not present

## 2019-04-07 DIAGNOSIS — Z9221 Personal history of antineoplastic chemotherapy: Secondary | ICD-10-CM | POA: Insufficient documentation

## 2019-04-07 DIAGNOSIS — Z79899 Other long term (current) drug therapy: Secondary | ICD-10-CM | POA: Insufficient documentation

## 2019-04-07 DIAGNOSIS — Z171 Estrogen receptor negative status [ER-]: Secondary | ICD-10-CM | POA: Insufficient documentation

## 2019-04-07 NOTE — Assessment & Plan Note (Signed)
11/08/2018: Routine screening mammogram detected 2 masses in the left breast, measuring 1.6cm and 1.1cm, spanning a total of 3cm. Biopsy confirmed IDC with DCIS, HER-2 negative by FISH (2+ by IHC), ER/PR negative, Ki67 80%.  Staging: T2N0 stage IIb clinical stage Breast MRI: 11/18/2018: 2.9 cm bilobed mass UOQ left breast  Treatment plan 1.Neoadjuvant chemotherapy with dose dense Adriamycin and Cytoxan x4 followed by Taxol and carboplatin 2.Breast conserving surgery with sentinel lymph node biopsy 2.Followed by adjuvant radiation -------------------------------------------------------------------------------------------------- Current treatment:Completed 4 cycles of dose denseAdriamycin andCytoxan,received 1 cycle of Taxol with carboplatin, discontinued further chemo  03/29/19: Left lumpectomy [name]): scattered microscopic foci of high grade DCIS with no invasive carcinoma, clear margins, 2 left axillary lymph nodes negative.  Pathology counseling: I discussed the final pathology report of the patient provided  a copy of this report. I discussed the margins as well as lymph node surgeries. We also discussed the final staging along with previously performed ER/PR and HER-2/neu testing.  Plan: Adj XRT F/U in 3 months for SCP visit

## 2019-04-11 DIAGNOSIS — E039 Hypothyroidism, unspecified: Secondary | ICD-10-CM | POA: Diagnosis not present

## 2019-04-11 DIAGNOSIS — E785 Hyperlipidemia, unspecified: Secondary | ICD-10-CM | POA: Diagnosis not present

## 2019-04-11 DIAGNOSIS — I10 Essential (primary) hypertension: Secondary | ICD-10-CM | POA: Diagnosis not present

## 2019-04-11 DIAGNOSIS — C50912 Malignant neoplasm of unspecified site of left female breast: Secondary | ICD-10-CM | POA: Diagnosis not present

## 2019-04-13 ENCOUNTER — Ambulatory Visit: Payer: Medicare Other | Admitting: Radiation Oncology

## 2019-04-13 ENCOUNTER — Ambulatory Visit
Admission: RE | Admit: 2019-04-13 | Discharge: 2019-04-13 | Disposition: A | Discharge status: Home / Self Care | Payer: Medicare Other | Source: Ambulatory Visit | Attending: Radiation Oncology | Admitting: Radiation Oncology

## 2019-04-13 ENCOUNTER — Encounter: Payer: Self-pay | Admitting: Radiation Oncology

## 2019-04-13 ENCOUNTER — Ambulatory Visit: Payer: Medicare Other

## 2019-04-13 ENCOUNTER — Other Ambulatory Visit: Payer: Self-pay

## 2019-04-13 VITALS — BP 146/84 | HR 100 | Temp 98.3°F | Resp 18

## 2019-04-13 DIAGNOSIS — E559 Vitamin D deficiency, unspecified: Secondary | ICD-10-CM | POA: Diagnosis not present

## 2019-04-13 DIAGNOSIS — Z79899 Other long term (current) drug therapy: Secondary | ICD-10-CM | POA: Insufficient documentation

## 2019-04-13 DIAGNOSIS — Z171 Estrogen receptor negative status [ER-]: Secondary | ICD-10-CM | POA: Insufficient documentation

## 2019-04-13 DIAGNOSIS — Z801 Family history of malignant neoplasm of trachea, bronchus and lung: Secondary | ICD-10-CM | POA: Diagnosis not present

## 2019-04-13 DIAGNOSIS — E785 Hyperlipidemia, unspecified: Secondary | ICD-10-CM | POA: Insufficient documentation

## 2019-04-13 DIAGNOSIS — Z9889 Other specified postprocedural states: Secondary | ICD-10-CM | POA: Diagnosis not present

## 2019-04-13 DIAGNOSIS — E039 Hypothyroidism, unspecified: Secondary | ICD-10-CM | POA: Diagnosis not present

## 2019-04-13 DIAGNOSIS — Z87891 Personal history of nicotine dependence: Secondary | ICD-10-CM | POA: Diagnosis not present

## 2019-04-13 DIAGNOSIS — C50412 Malignant neoplasm of upper-outer quadrant of left female breast: Secondary | ICD-10-CM | POA: Diagnosis not present

## 2019-04-13 DIAGNOSIS — I10 Essential (primary) hypertension: Secondary | ICD-10-CM | POA: Insufficient documentation

## 2019-04-13 DIAGNOSIS — Z9221 Personal history of antineoplastic chemotherapy: Secondary | ICD-10-CM | POA: Insufficient documentation

## 2019-04-13 DIAGNOSIS — Z800 Family history of malignant neoplasm of digestive organs: Secondary | ICD-10-CM | POA: Diagnosis not present

## 2019-04-13 DIAGNOSIS — Z803 Family history of malignant neoplasm of breast: Secondary | ICD-10-CM | POA: Diagnosis not present

## 2019-04-13 NOTE — Progress Notes (Signed)
Location of Breast Cancer: Malignant neoplasm of upper-outer quadrant of left breast in female, estrogen receptor negative ([place])  Histology per Pathology Report: 03/29/19: SURGICAL PATHOLOGY  CASE: [id number]  PATIENT: Carol Byrd  Surgical Pathology Report   Clinical History: left breast cancer (cm)   FINAL MICROSCOPIC DIAGNOSIS:   A. BREAST, LEFT, LUMPECTOMY:  - Ductal carcinoma in situ, high-grade, scattered microscopic foci.  - No invasive carcinoma is identified.  - The surgical resection margins are negative for carcinoma.  - See oncology table below.   B. LYMPH NODE, LEFT AXILLARY, SENTINEL, BIOPSY:  - There is no evidence of carcinoma in 1 of 1 lymph node (0/1).   C. LYMPH NODE, LEFT AXILLARY, SENTINEL, BIOPSY:  - There is no evidence of carcinoma in 1 of 1 lymph node (0/1).  Receptor Status: ER(0%), PR (0%), Her2-neu (negative, 2+), Ki-(80%)  Did patient present with symptoms (if so, please note symptoms) or was this found on screening mammography?: The cancer was detected on a routine screening mammogram on 10/27/18. Diagnostic mammogram on 11/02/18 showed 2 adjacent masses in the left breast measuring collectively 2.9cm. US showed 2 masses at the 2 o'clock position measuring 1.6cm and 1.1cm, with no lymphadenopathy seen in the left axilla. Biopsy on 11/08/18 showed grade 3 IDC with DCIS, HER2 negative by FISH, ER/PR negative, Ki67 80%. She has a family history of recurrent breast cancer and melanoma in her sister and breast cancer in her maternal aunt.   Past/Anticipated interventions by surgeon, if any: Procedure: 1.  Injection of blue dye for sentinel lymph node identification 2.  Left breast radioactive seed guided lumpectomy 3.  Left deep axillary sentinel lymph node biopsy Surgeon: Dr. Serita Grammes  Past/Anticipated interventions by medical oncology, if any: Chemotherapy Per Dr. Lindi Adie 04/07/19: Treatment plan 1.Neoadjuvant chemotherapy with dose dense  Adriamycin and Cytoxan x4 followed by Taxol and carboplatin completed 01/25/2019 2.Breast conserving surgery with sentinel lymph node biopsy 03/29/2019 2.Followed by adjuvant radiation -------------------------------------------------------------------------------------------------- Current treatment:Plan is for adjuvant radiation followed by surveillance  05/28/18: Left lumpectomy Carol Byrd): scattered microscopic foci of high grade DCIS with no invasive carcinoma, clear margins, 2 left axillary lymph nodes negative.  Pathology counseling: I discussed the final pathology report of the patient provided  a copy of this report. I discussed the margins as well as lymph node surgeries. We also discussed the final staging along with previously performed ER/PR and HER-2/neu testing.  Plan: Adj XRT F/U at the end of radiation.  Lymphedema issues, if any:  Pt reports swelling in axilla. Pt states has improved since beginning post-op exercises.   Pain issues, if any: Pt reports soreness in axilla, rated 3/10  SAFETY ISSUES:  Prior radiation? no  Pacemaker/ICD? no  Possible current pregnancy?no  Is the patient on methotrexate? no  Current Complaints / other details:  Pt presents today for initial consult with Dr. Sondra Come for Radiation Oncology.  BP (!) 146/84 (BP Location: Left Arm, Patient Position: Bed low/side rails up, Cuff Size: Normal)   Pulse 100   Temp 98.3 F (36.8 C)   Resp 18   SpO2 98%   Wt Readings from Last 3 Encounters:  04/07/19 155 lb 6.4 oz (70.5 kg)  03/29/19 156 lb 8.4 oz (71 kg)  02/23/19 157 lb (71.2 kg)       Carol Sousa, RN 04/13/2019,2:17 PM

## 2019-04-13 NOTE — Progress Notes (Signed)
Radiation Oncology         [phone] ________________________________  Initial Outpatient Consultation  Name: Carol Byrd MRN: [id number]  Date: 04/13/2019  DOB: 03-22-1946  BR:AXENM, Carloyn Manner, MD  Nicholas Lose, MD   REFERRING PHYSICIAN: Nicholas Lose, MD  DIAGNOSIS: The encounter diagnosis was Malignant neoplasm of upper-outer quadrant of left breast in female, estrogen receptor negative ([place]).  Stage IIB (T2N0) Left Breast UOQ, Ductal Carcinoma DCIS, ER- / PR- / Her2-, High-grade   HISTORY OF PRESENT ILLNESS::Carol Byrd is a 73 y.o. female who is accompanied by no one due to COVID-19 restrictions. She had routine screening mammography on 10/27/2018 showing a possible abnormality in the left breast. She underwent unilateral diagnostic mammography and left breast ultrasonography on 11/02/2018 showing two suspicious masses in the left breast at the 2:30 position.  Biopsy on 11/08/2018 showed invasive ductal carcinoma  of left breast. Prognostic indicators significant for: estrogen receptor, 0% negative and progesterone receptor, 0% negative. Proliferation marker Ki67 at 80%. HER2 negative.  MRI of bilateral breasts on 11/17/2018 showed 2.9 cm bilobed mass in the UOQ of the left breast, consistent with known malignancy. 0.5 cm satellite nodule is 0.5 cm below this mass. Right breast was negative. She underwent genetic testing on 11/18/2018. Results were negative.  She has been treated with neoadjuvant chemotherapy with dose dense Adriamycin and Cytoxan x4 followed by Taxol and Carboplatin under the care of Dr. Lindi Adie. Chemotherapy began on 12/01/2018 and ended on 01/25/2019 secondary to toxicities. She experienced chemo induced anemia, severe fatigue, alopecia, and thrombocytopenia.  MRI of bilateral breasts on 02/16/2019 showed near complete resolution of the enhancement associated with the two biopsy proven sites of malignancy in the upper outer left breast. There  was 3 mm of residual enhancement. The 5 mm satellite nodule just inferior to the biopsy sites had also improved. There was no evidence of right breast malignancy.  She opted to proceed with left breast lumpectomy with nodal biopsy on 03/29/2019. Pathology from the procedure revealed ductal carcinoma in situ, high-grade, scattered microscopic foci of left breast without invasive carcinoma. The surgical resection margins were negative for carcinoma. Two left axillary sentinel lymph nodes were biopsied, both of which did not show any evidence of carcinoma.  PREVIOUS RADIATION THERAPY: No  PAST MEDICAL HISTORY:  Past Medical History:  Diagnosis Date  . Alopecia   . Breast cancer ([place])   . Cervical disc disease   . Essential hypertension   . Family history of breast cancer   . Family history of lung cancer   . Family history of melanoma   . Family history of multiple myeloma   . Family history of stomach cancer   . GERD (gastroesophageal reflux disease)   . Hypothyroidism   . Impaired fasting glucose   . Mixed hyperlipidemia   . Vitamin D deficiency     PAST SURGICAL HISTORY: Past Surgical History:  Procedure Laterality Date  . BACK SURGERY  1985   bulging lumbar disk  . BIOPSY  04/14/2018   Procedure: BIOPSY;  Surgeon: Rogene Houston, MD;  [facility];  Service: Endoscopy;;  esophagus  . BREAST LUMPECTOMY WITH RADIOACTIVE SEED AND SENTINEL LYMPH NODE BIOPSY Left 03/29/2019   Procedure: LEFT BREAST LUMPECTOMY WITH RADIOACTIVE SEED AND LEFT AXILLARY SENTINEL LYMPH NODE BIOPSY BLUE DYE INJECTION;  Surgeon: Rolm Bookbinder, MD;  Location: [facility];  Service: General;  Laterality: Left;  . cataracts    . COLONOSCOPY N/A 02/14/2015  Procedure: COLONOSCOPY;  Surgeon: Rogene Houston, MD;  [facility];  Service: Endoscopy;  Laterality: N/A;  1200  . ESOPHAGEAL DILATION N/A 04/14/2018   Procedure: ESOPHAGEAL DILATION;  Surgeon: Rogene Houston,  MD;  [facility];  Service: Endoscopy;  Laterality: N/A;  . ESOPHAGOGASTRODUODENOSCOPY N/A 04/14/2018   Procedure: ESOPHAGOGASTRODUODENOSCOPY (EGD);  Surgeon: Rogene Houston, MD;  [facility];  Service: Endoscopy;  Laterality: N/A;  3:10  . PORT-A-CATH REMOVAL Right 03/29/2019   Procedure: REMOVAL PORT-A-CATH;  Surgeon: Rolm Bookbinder, MD;  Location: [facility];  Service: General;  Laterality: Right;  . PORTACATH PLACEMENT Right 11/30/2018   Procedure: INSERTION PORT-A-CATH WITH ULTRASOUND;  Surgeon: Rolm Bookbinder, MD;  Location: [facility];  Service: General;  Laterality: Right;  . RETINAL TEAR REPAIR CRYOTHERAPY    . THYROIDECTOMY      FAMILY HISTORY:  Family History  Problem Relation Age of Onset  . Depression Mother   . Hypertension Mother   . Heart attack Father   . Heart disease Other   . Multiple myeloma Cousin 76       maternal first cousin  . Breast cancer Sister 26       second breast cancer diagnosed at 72  . Breast cancer Maternal Aunt 80  . Cancer Maternal Uncle        unknown  . Stomach cancer Maternal Grandfather 80  . Lung cancer Maternal Uncle 21  . Lung cancer Maternal Uncle 33  . Lung cancer Cousin        maternal first cousins  . Cancer Cousin        unknown, paternal first cousins  . Colon cancer Neg Hx     SOCIAL HISTORY:  Social History   Tobacco Use  . Smoking status: Former Smoker    Types: Cigarettes    Quit date: 04/14/1994    Years since quitting: 25.0  . Smokeless tobacco: Never Used  Substance Use Topics  . Alcohol use: Not Currently    Alcohol/week: 2.0 standard drinks    Types: 2 Glasses of wine per week    Comment: wine but not recently  . Drug use: No    ALLERGIES:  Allergies  Allergen Reactions  . Alpha-Gal Anaphylaxis, Itching and Swelling    MEDICATIONS:  Current Outpatient Medications  Medication Sig Dispense Refill  . atorvastatin (LIPITOR) 20 MG tablet Take 20 mg by mouth  See admin instructions. Takes in the evening on Tuesdays only    . Biotin 5000 MCG TABS Take 5,000 mcg by mouth daily. 30 tablet   . cholecalciferol (VITAMIN D3) 25 MCG (1000 UT) tablet Take 2,000 Units by mouth daily.    Marland Kitchen ezetimibe (ZETIA) 10 MG tablet Take 10 mg by mouth at bedtime.    Marland Kitchen levothyroxine (SYNTHROID) 75 MCG tablet Take 75 mcg by mouth daily.    . Methylcellulose, Laxative, (CITRUCEL) 500 MG TABS Take 500 mg by mouth daily.    . pantoprazole (PROTONIX) 40 MG tablet Take 40 mg by mouth 2 (two) times daily before a meal.    . Polyethyl Glycol-Propyl Glycol (SYSTANE) 0.4-0.3 % SOLN Place 1 drop into both eyes at bedtime as needed (dry eyes).    Marland Kitchen telmisartan (MICARDIS) 40 MG tablet Take 40 mg by mouth daily.     No current facility-administered medications for this encounter.     REVIEW OF SYSTEMS:  A 10+ POINT REVIEW OF SYSTEMS WAS OBTAINED including neurology, dermatology, psychiatry, cardiac,  respiratory, lymph, extremities, GI, GU, musculoskeletal, constitutional, reproductive, HEENT.  She reports some soreness in the breast and axillary region but no significant pain.  She denies any swelling in her left arm or hand.  She denies any new bony pain headaches dizziness or blurred vision.   PHYSICAL EXAM:  temperature is 98.3 F (36.8 C). Her blood pressure is 146/84 (abnormal) and her pulse is 100. Her respiration is 18 and oxygen saturation is 98%.   General: Alert and oriented, in no acute distress HEENT: Head is normocephalic. Extraocular movements are intact. Oropharynx is clear. Neck: Neck is supple, no palpable cervical or supraclavicular lymphadenopathy. Heart: Regular in rate and rhythm with no murmurs, rubs, or gallops. Chest: Clear to auscultation bilaterally, with no rhonchi, wheezes, or rales. Abdomen: Soft, nontender, nondistended, with no rigidity or guarding. Extremities: No cyanosis or edema. Lymphatics: see Neck Exam Skin: No concerning lesions.  Musculoskeletal: symmetric strength and muscle tone throughout. Neurologic: Cranial nerves II through XII are grossly intact. No obvious focalities. Speech is fluent. Coordination is intact. Psychiatric: Judgment and insight are intact. Affect is appropriate. Right Breast: no palpable mass, nipple discharge or bleeding. Left Breast: Well-healing scar in the upper outer quadrant with Steri-Strips in place.  No separate sentinel node scar appreciated. no signs of drainage or infection.  Some swelling noted in the breast.  No nipple discharge or bleeding.   ECOG = 1  0 - Asymptomatic (Fully active, able to carry on all predisease activities without restriction)  1 - Symptomatic but completely ambulatory (Restricted in physically strenuous activity but ambulatory and able to carry out work of a light or sedentary nature. For example, light housework, office work)  2 - Symptomatic, <50% in bed during the day (Ambulatory and capable of all self care but unable to carry out any work activities. Up and about more than 50% of waking hours)  3 - Symptomatic, >50% in bed, but not bedbound (Capable of only limited self-care, confined to bed or chair 50% or more of waking hours)  4 - Bedbound (Completely disabled. Cannot carry on any self-care. Totally confined to bed or chair)  5 - Death   Eustace Pen MM, Creech RH, Tormey DC, et al. [phone]). "Toxicity and response criteria of the [facility]". [place] Oncol. 5 (6): 649-55  LABORATORY DATA:  Lab Results  Component Value Date   WBC 4.1 03/14/2019   HGB 9.9 (L) 03/14/2019   HCT 29.4 (L) 03/14/2019   MCV 106.9 (H) 03/14/2019   PLT 192 03/14/2019   NEUTROABS 2.4 03/14/2019   Lab Results  Component Value Date   NA 142 03/14/2019   K 4.4 03/14/2019   CL 105 03/14/2019   CO2 24 03/14/2019   GLUCOSE 85 03/14/2019   CREATININE 1.19 (H) 03/14/2019   CALCIUM 9.2 03/14/2019      RADIOGRAPHY: Nm Sentinel Node Inj-no Rpt  (breast)  Result Date: 03/29/2019 Sulfur colloid was injected by the nuclear medicine technologist for melanoma sentinel node.   Mm Breast Surgical Specimen  Result Date: 03/29/2019 CLINICAL DATA:  Specimen radiograph status post left breast lumpectomy. EXAM: SPECIMEN RADIOGRAPH OF THE LEFT BREAST COMPARISON:  Previous exam(s). FINDINGS: Status post excision of the left breast. The radioactive seed and 2 biopsy marker clips are present and completely intact. These findings were communicated with the OR at 9:04 a.m. IMPRESSION: Specimen radiograph of the left breast. Electronically Signed   By: Ammie Ferrier M.D.   On: 03/29/2019 09:04  Mm Lt Radioactive Seed Loc Mammo Guide  Result Date: 03/28/2019 CLINICAL DATA:  Patient with abutting breast cancers in the upper-outer quadrant of the LEFT breast scheduled for breast conservation surgery requiring preoperative radioactive seed localization. EXAM: MAMMOGRAPHIC GUIDED RADIOACTIVE SEED LOCALIZATION OF THE LEFT BREAST COMPARISON:  Previous exam(s). FINDINGS: Patient presents for radioactive seed localization prior to breast conservation surgery. I met with the patient and we discussed the procedure of seed localization including benefits and alternatives. We discussed the high likelihood of a successful procedure. We discussed the risks of the procedure including infection, bleeding, tissue injury and further surgery. We discussed the low dose of radioactivity involved in the procedure. Informed, written consent was given. The usual time-out protocol was performed immediately prior to the procedure. Using mammographic guidance, sterile technique, 1% lidocaine and an I-125 radioactive seed, the area between the coil shaped and ribbon shaped clips was localized using a lateral approach. The follow-up mammogram images confirm the seed in the expected location and were marked for Dr. [name]. Follow-up survey of the patient confirms presence of the  radioactive seed. Order number of I-125 seed:  [id number]. Total activity:  9.937 millicurie reference Date: 02/25/2019 The patient tolerated the procedure well and was released from the [facility]. She was given instructions regarding seed removal. IMPRESSION: Radioactive seed localization left breast. No apparent complications. Electronically Signed   By: Franki Cabot M.D.   On: 03/28/2019 13:23      IMPRESSION: Stage IIB (T2N0) Left Breast UOQ, Ductal Carcinoma DCIS, ER- / PR- / Her2-, High-grade   Patient has had an excellent response to her neoadjuvant chemotherapy.  She would be a good candidate for breast conservation with radiation therapy directed at the left breast.  Today, I talked to the patient  about the findings and work-up thus far.  We discussed the natural history of breast cancer and general treatment, highlighting the role of radiotherapy in the management.  We discussed the available radiation techniques, and focused on the details of logistics and delivery.  We reviewed the anticipated acute and late sequelae associated with radiation in this setting.  The patient was encouraged to ask questions that I answered to the best of my ability.  A patient consent form was discussed and signed.  We retained a copy for our records.  The patient would like to proceed with radiation and will be scheduled for CT simulation.  PLAN: Patient is scheduled for CT simulation on December 7 at 11 AM with treatments to begin approximately a week later.  She would appear to be a good candidate for hypofractionated accelerated radiation therapy over 3-1/2 to 4 weeks.  Will use cardiac sparing techniques if necessary.    ------------------------------------------------  Blair Promise, PhD, MD  This document serves as a record of services personally performed by Gery Pray, MD. It was created on his behalf by Clerance Lav, a trained medical scribe. The creation of this record is based on the  scribe's personal observations and the provider's statements to them. This document has been checked and approved by the attending provider.

## 2019-04-13 NOTE — Patient Instructions (Signed)
Coronavirus (COVID-19) Are you at risk?  Are you at risk for the Coronavirus (COVID-19)?  To be considered HIGH RISK for Coronavirus (COVID-19), you have to meet the following criteria:  . Traveled to [place], [place], [place], [place] or [place]; or in the [place] to [place], [place], [place], or [place]; and have fever, cough, and shortness of breath within the last 2 weeks of travel OR . Been in close contact with a person diagnosed with COVID-19 within the last 2 weeks and have fever, cough, and shortness of breath . IF YOU DO NOT MEET THESE CRITERIA, YOU ARE CONSIDERED LOW RISK FOR COVID-19.  What to do if you are HIGH RISK for COVID-19?  . If you are having a medical emergency, call 911. . Seek medical care right away. Before you go to a doctor's office, urgent care or emergency department, call ahead and tell them about your recent travel, contact with someone diagnosed with COVID-19, and your symptoms. You should receive instructions from your physician's office regarding next steps of care.  . When you arrive at healthcare provider, tell the healthcare staff immediately you have returned from visiting [place], [place], [place], [place] or [place]; or traveled in the [place] to [place], [place], [place], or [place]; in the last two weeks or you have been in close contact with a person diagnosed with COVID-19 in the last 2 weeks.   . Tell the health care staff about your symptoms: fever, cough and shortness of breath. . After you have been seen by a medical provider, you will be either: o Tested for (COVID-19) and discharged home on quarantine except to seek medical care if symptoms worsen, and asked to  - Stay home and avoid contact with others until you get your results (4-5 days)  - Avoid travel on public transportation if possible (such as bus, train, or airplane) or o Sent to the Emergency Department by EMS for evaluation, COVID-19 testing, and possible  admission depending on your condition and test results.  What to do if you are LOW RISK for COVID-19?  Reduce your risk of any infection by using the same precautions used for avoiding the common cold or flu:  . Wash your hands often with soap and warm water for at least 20 seconds.  If soap and water are not readily available, use an alcohol-based hand sanitizer with at least 60% alcohol.  . If coughing or sneezing, cover your mouth and nose by coughing or sneezing into the elbow areas of your shirt or coat, into a tissue or into your sleeve (not your hands). . Avoid shaking hands with others and consider head nods or verbal greetings only. . Avoid touching your eyes, nose, or mouth with unwashed hands.  . Avoid close contact with people who are sick. . Avoid places or events with large numbers of people in one location, like concerts or sporting events. . Carefully consider travel plans you have or are making. . If you are planning any travel outside or inside the [place], visit the CDC's Travelers' Health webpage for the latest health notices. . If you have some symptoms but not all symptoms, continue to monitor at home and seek medical attention if your symptoms worsen. . If you are having a medical emergency, call 911.   ADDITIONAL HEALTHCARE OPTIONS FOR PATIENTS  [facility] / e-Visit: [email or web address]         [facility]: [phone]  [facility]: [phone]                   [facility]: [phone]   

## 2019-04-19 DIAGNOSIS — Z91018 Allergy to other foods: Secondary | ICD-10-CM | POA: Diagnosis not present

## 2019-04-19 DIAGNOSIS — Z87892 Personal history of anaphylaxis: Secondary | ICD-10-CM | POA: Diagnosis not present

## 2019-04-25 ENCOUNTER — Ambulatory Visit
Admission: RE | Admit: 2019-04-25 | Discharge: 2019-04-25 | Disposition: A | Discharge status: Home / Self Care | Payer: Medicare Other | Source: Ambulatory Visit | Attending: Radiation Oncology | Admitting: Radiation Oncology

## 2019-04-25 ENCOUNTER — Other Ambulatory Visit: Payer: Self-pay

## 2019-04-25 DIAGNOSIS — C50412 Malignant neoplasm of upper-outer quadrant of left female breast: Secondary | ICD-10-CM | POA: Insufficient documentation

## 2019-04-25 DIAGNOSIS — Z171 Estrogen receptor negative status [ER-]: Secondary | ICD-10-CM | POA: Insufficient documentation

## 2019-04-25 DIAGNOSIS — Z510 Encounter for antineoplastic radiation therapy: Secondary | ICD-10-CM | POA: Insufficient documentation

## 2019-04-28 DIAGNOSIS — C50412 Malignant neoplasm of upper-outer quadrant of left female breast: Secondary | ICD-10-CM | POA: Diagnosis not present

## 2019-04-28 DIAGNOSIS — Z171 Estrogen receptor negative status [ER-]: Secondary | ICD-10-CM | POA: Diagnosis not present

## 2019-04-28 DIAGNOSIS — Z510 Encounter for antineoplastic radiation therapy: Secondary | ICD-10-CM | POA: Diagnosis not present

## 2019-04-29 ENCOUNTER — Telehealth: Payer: Self-pay | Admitting: Hematology and Oncology

## 2019-04-29 NOTE — Telephone Encounter (Signed)
Scheduled appt per 12/10 sch message - pt is aware of appt date and time

## 2019-05-02 ENCOUNTER — Ambulatory Visit
Admission: RE | Admit: 2019-05-02 | Discharge: 2019-05-02 | Disposition: A | Discharge status: Home / Self Care | Payer: Medicare Other | Source: Ambulatory Visit | Attending: Radiation Oncology | Admitting: Radiation Oncology

## 2019-05-02 DIAGNOSIS — Z510 Encounter for antineoplastic radiation therapy: Secondary | ICD-10-CM | POA: Diagnosis not present

## 2019-05-02 DIAGNOSIS — C50412 Malignant neoplasm of upper-outer quadrant of left female breast: Secondary | ICD-10-CM | POA: Diagnosis not present

## 2019-05-02 DIAGNOSIS — Z171 Estrogen receptor negative status [ER-]: Secondary | ICD-10-CM | POA: Diagnosis not present

## 2019-05-02 NOTE — Progress Notes (Signed)
  Radiation Oncology         [phone]) [phone] ________________________________  Name: Carol Byrd MRN: [id number]  Date: 05/02/2019  DOB: Jan 05, 1946  Simulation Verification Note    ICD-10-CM   1. Malignant neoplasm of upper-outer quadrant of left breast in female, estrogen receptor negative ([place])  C50.412    Z17.1     Status: outpatient  NARRATIVE: The patient was brought to the treatment unit and placed in the planned treatment position. The clinical setup was verified. Then port films were obtained and uploaded to the radiation oncology medical record software.  The treatment beams were carefully compared against the planned radiation fields. The position location and shape of the radiation fields was reviewed. They targeted volume of tissue appears to be appropriately covered by the radiation beams. Organs at risk appear to be excluded as planned.  Based on my personal review, I approved the simulation verification. The patient's treatment will proceed as planned.  -----------------------------------  Blair Promise, PhD, MD

## 2019-05-03 ENCOUNTER — Ambulatory Visit
Admission: RE | Admit: 2019-05-03 | Discharge: 2019-05-03 | Disposition: A | Discharge status: Home / Self Care | Payer: Medicare Other | Source: Ambulatory Visit | Attending: Radiation Oncology | Admitting: Radiation Oncology

## 2019-05-03 ENCOUNTER — Ambulatory Visit: Payer: Medicare Other | Admitting: Radiation Oncology

## 2019-05-03 DIAGNOSIS — Z510 Encounter for antineoplastic radiation therapy: Secondary | ICD-10-CM | POA: Diagnosis not present

## 2019-05-03 DIAGNOSIS — C50412 Malignant neoplasm of upper-outer quadrant of left female breast: Secondary | ICD-10-CM | POA: Diagnosis not present

## 2019-05-03 DIAGNOSIS — Z171 Estrogen receptor negative status [ER-]: Secondary | ICD-10-CM | POA: Diagnosis not present

## 2019-05-03 MED ORDER — SONAFINE EX EMUL
1.0000 "application " | Freq: Two times a day (BID) | CUTANEOUS | Status: DC
  Administered 2019-05-03: 1 via TOPICAL

## 2019-05-03 MED ORDER — ALRA NON-METALLIC DEODORANT (RAD-ONC)
1.0000 "application " | Freq: Once | TOPICAL | Status: AC
  Administered 2019-05-03: 1 via TOPICAL

## 2019-05-04 ENCOUNTER — Other Ambulatory Visit: Payer: Self-pay

## 2019-05-04 ENCOUNTER — Ambulatory Visit
Admission: RE | Admit: 2019-05-04 | Discharge: 2019-05-04 | Disposition: A | Discharge status: Home / Self Care | Payer: Medicare Other | Source: Ambulatory Visit | Attending: Radiation Oncology | Admitting: Radiation Oncology

## 2019-05-04 DIAGNOSIS — Z510 Encounter for antineoplastic radiation therapy: Secondary | ICD-10-CM | POA: Diagnosis not present

## 2019-05-04 DIAGNOSIS — C50412 Malignant neoplasm of upper-outer quadrant of left female breast: Secondary | ICD-10-CM | POA: Diagnosis not present

## 2019-05-04 DIAGNOSIS — Z171 Estrogen receptor negative status [ER-]: Secondary | ICD-10-CM | POA: Diagnosis not present

## 2019-05-05 ENCOUNTER — Other Ambulatory Visit: Payer: Self-pay

## 2019-05-05 ENCOUNTER — Ambulatory Visit
Admission: RE | Admit: 2019-05-05 | Discharge: 2019-05-05 | Disposition: A | Discharge status: Home / Self Care | Payer: Medicare Other | Source: Ambulatory Visit | Attending: Radiation Oncology | Admitting: Radiation Oncology

## 2019-05-05 DIAGNOSIS — C50412 Malignant neoplasm of upper-outer quadrant of left female breast: Secondary | ICD-10-CM | POA: Diagnosis not present

## 2019-05-05 DIAGNOSIS — Z510 Encounter for antineoplastic radiation therapy: Secondary | ICD-10-CM | POA: Diagnosis not present

## 2019-05-05 DIAGNOSIS — Z171 Estrogen receptor negative status [ER-]: Secondary | ICD-10-CM | POA: Diagnosis not present

## 2019-05-06 ENCOUNTER — Ambulatory Visit
Admission: RE | Admit: 2019-05-06 | Discharge: 2019-05-06 | Disposition: A | Discharge status: Home / Self Care | Payer: Medicare Other | Source: Ambulatory Visit | Attending: Radiation Oncology | Admitting: Radiation Oncology

## 2019-05-06 ENCOUNTER — Other Ambulatory Visit: Payer: Self-pay

## 2019-05-06 DIAGNOSIS — C50412 Malignant neoplasm of upper-outer quadrant of left female breast: Secondary | ICD-10-CM | POA: Diagnosis not present

## 2019-05-06 DIAGNOSIS — Z171 Estrogen receptor negative status [ER-]: Secondary | ICD-10-CM | POA: Diagnosis not present

## 2019-05-06 DIAGNOSIS — Z510 Encounter for antineoplastic radiation therapy: Secondary | ICD-10-CM | POA: Diagnosis not present

## 2019-05-08 ENCOUNTER — Encounter: Payer: Self-pay | Admitting: Hematology and Oncology

## 2019-05-09 ENCOUNTER — Other Ambulatory Visit: Payer: Self-pay

## 2019-05-09 ENCOUNTER — Ambulatory Visit
Admission: RE | Admit: 2019-05-09 | Discharge: 2019-05-09 | Disposition: A | Discharge status: Home / Self Care | Payer: Medicare Other | Source: Ambulatory Visit | Attending: Radiation Oncology | Admitting: Radiation Oncology

## 2019-05-09 DIAGNOSIS — Z510 Encounter for antineoplastic radiation therapy: Secondary | ICD-10-CM | POA: Diagnosis not present

## 2019-05-09 DIAGNOSIS — C50412 Malignant neoplasm of upper-outer quadrant of left female breast: Secondary | ICD-10-CM | POA: Diagnosis not present

## 2019-05-09 DIAGNOSIS — Z171 Estrogen receptor negative status [ER-]: Secondary | ICD-10-CM | POA: Diagnosis not present

## 2019-05-10 ENCOUNTER — Ambulatory Visit
Admission: RE | Admit: 2019-05-10 | Discharge: 2019-05-10 | Disposition: A | Discharge status: Home / Self Care | Payer: Medicare Other | Source: Ambulatory Visit | Attending: Radiation Oncology | Admitting: Radiation Oncology

## 2019-05-10 ENCOUNTER — Other Ambulatory Visit: Payer: Self-pay

## 2019-05-10 DIAGNOSIS — Z171 Estrogen receptor negative status [ER-]: Secondary | ICD-10-CM | POA: Diagnosis not present

## 2019-05-10 DIAGNOSIS — Z510 Encounter for antineoplastic radiation therapy: Secondary | ICD-10-CM | POA: Diagnosis not present

## 2019-05-10 DIAGNOSIS — C50412 Malignant neoplasm of upper-outer quadrant of left female breast: Secondary | ICD-10-CM | POA: Diagnosis not present

## 2019-05-11 ENCOUNTER — Ambulatory Visit
Admission: RE | Admit: 2019-05-11 | Discharge: 2019-05-11 | Disposition: A | Discharge status: Home / Self Care | Payer: Medicare Other | Source: Ambulatory Visit | Attending: Radiation Oncology | Admitting: Radiation Oncology

## 2019-05-11 ENCOUNTER — Other Ambulatory Visit: Payer: Self-pay

## 2019-05-11 DIAGNOSIS — C50412 Malignant neoplasm of upper-outer quadrant of left female breast: Secondary | ICD-10-CM | POA: Diagnosis not present

## 2019-05-11 DIAGNOSIS — Z510 Encounter for antineoplastic radiation therapy: Secondary | ICD-10-CM | POA: Diagnosis not present

## 2019-05-11 DIAGNOSIS — Z171 Estrogen receptor negative status [ER-]: Secondary | ICD-10-CM | POA: Diagnosis not present

## 2019-05-12 ENCOUNTER — Ambulatory Visit
Admission: RE | Admit: 2019-05-12 | Discharge: 2019-05-12 | Disposition: A | Discharge status: Home / Self Care | Payer: Medicare Other | Source: Ambulatory Visit | Attending: Radiation Oncology | Admitting: Radiation Oncology

## 2019-05-12 ENCOUNTER — Other Ambulatory Visit: Payer: Self-pay

## 2019-05-12 DIAGNOSIS — Z510 Encounter for antineoplastic radiation therapy: Secondary | ICD-10-CM | POA: Diagnosis not present

## 2019-05-12 DIAGNOSIS — Z171 Estrogen receptor negative status [ER-]: Secondary | ICD-10-CM | POA: Diagnosis not present

## 2019-05-12 DIAGNOSIS — C50412 Malignant neoplasm of upper-outer quadrant of left female breast: Secondary | ICD-10-CM | POA: Diagnosis not present

## 2019-05-16 ENCOUNTER — Encounter: Payer: Self-pay | Admitting: *Deleted

## 2019-05-16 ENCOUNTER — Other Ambulatory Visit: Payer: Self-pay

## 2019-05-16 ENCOUNTER — Ambulatory Visit
Admission: RE | Admit: 2019-05-16 | Discharge: 2019-05-16 | Disposition: A | Discharge status: Home / Self Care | Payer: Medicare Other | Source: Ambulatory Visit | Attending: Radiation Oncology | Admitting: Radiation Oncology

## 2019-05-16 DIAGNOSIS — Z171 Estrogen receptor negative status [ER-]: Secondary | ICD-10-CM | POA: Diagnosis not present

## 2019-05-16 DIAGNOSIS — Z510 Encounter for antineoplastic radiation therapy: Secondary | ICD-10-CM | POA: Diagnosis not present

## 2019-05-16 DIAGNOSIS — C50412 Malignant neoplasm of upper-outer quadrant of left female breast: Secondary | ICD-10-CM | POA: Diagnosis not present

## 2019-05-17 ENCOUNTER — Other Ambulatory Visit: Payer: Self-pay

## 2019-05-17 ENCOUNTER — Ambulatory Visit
Admission: RE | Admit: 2019-05-17 | Discharge: 2019-05-17 | Disposition: A | Discharge status: Home / Self Care | Payer: Medicare Other | Source: Ambulatory Visit | Attending: Radiation Oncology | Admitting: Radiation Oncology

## 2019-05-17 DIAGNOSIS — Z171 Estrogen receptor negative status [ER-]: Secondary | ICD-10-CM | POA: Diagnosis not present

## 2019-05-17 DIAGNOSIS — C50412 Malignant neoplasm of upper-outer quadrant of left female breast: Secondary | ICD-10-CM

## 2019-05-17 DIAGNOSIS — Z510 Encounter for antineoplastic radiation therapy: Secondary | ICD-10-CM | POA: Diagnosis not present

## 2019-05-17 MED ORDER — SONAFINE EX EMUL
1.0000 "application " | Freq: Two times a day (BID) | CUTANEOUS | Status: DC
  Administered 2019-05-17: 1 via TOPICAL

## 2019-05-18 ENCOUNTER — Ambulatory Visit
Admission: RE | Admit: 2019-05-18 | Discharge: 2019-05-18 | Disposition: A | Discharge status: Home / Self Care | Payer: Medicare Other | Source: Ambulatory Visit | Attending: Radiation Oncology | Admitting: Radiation Oncology

## 2019-05-18 ENCOUNTER — Other Ambulatory Visit: Payer: Self-pay

## 2019-05-18 DIAGNOSIS — Z171 Estrogen receptor negative status [ER-]: Secondary | ICD-10-CM | POA: Diagnosis not present

## 2019-05-18 DIAGNOSIS — Z510 Encounter for antineoplastic radiation therapy: Secondary | ICD-10-CM | POA: Diagnosis not present

## 2019-05-18 DIAGNOSIS — C50412 Malignant neoplasm of upper-outer quadrant of left female breast: Secondary | ICD-10-CM | POA: Diagnosis not present

## 2019-05-19 ENCOUNTER — Other Ambulatory Visit: Payer: Self-pay

## 2019-05-19 ENCOUNTER — Ambulatory Visit
Admission: RE | Admit: 2019-05-19 | Discharge: 2019-05-19 | Disposition: A | Discharge status: Home / Self Care | Payer: Medicare Other | Source: Ambulatory Visit | Attending: Radiation Oncology | Admitting: Radiation Oncology

## 2019-05-19 DIAGNOSIS — Z171 Estrogen receptor negative status [ER-]: Secondary | ICD-10-CM | POA: Diagnosis not present

## 2019-05-19 DIAGNOSIS — C50412 Malignant neoplasm of upper-outer quadrant of left female breast: Secondary | ICD-10-CM | POA: Diagnosis not present

## 2019-05-19 DIAGNOSIS — Z510 Encounter for antineoplastic radiation therapy: Secondary | ICD-10-CM | POA: Diagnosis not present

## 2019-05-23 ENCOUNTER — Ambulatory Visit
Admission: RE | Admit: 2019-05-23 | Discharge: 2019-05-23 | Disposition: A | Discharge status: Home / Self Care | Payer: Medicare Other | Source: Ambulatory Visit | Attending: Radiation Oncology | Admitting: Radiation Oncology

## 2019-05-23 DIAGNOSIS — C50412 Malignant neoplasm of upper-outer quadrant of left female breast: Secondary | ICD-10-CM | POA: Insufficient documentation

## 2019-05-23 DIAGNOSIS — Z510 Encounter for antineoplastic radiation therapy: Secondary | ICD-10-CM | POA: Insufficient documentation

## 2019-05-23 DIAGNOSIS — Z171 Estrogen receptor negative status [ER-]: Secondary | ICD-10-CM | POA: Insufficient documentation

## 2019-05-23 NOTE — Progress Notes (Signed)
Patient Care Team: [name], MD as PCP - General (Internal Medicine) Satira Sark, MD as PCP - Cardiology (Cardiology) Mauro Kaufmann, RN as Oncology Nurse Navigator Rockwell Germany, RN as Oncology Nurse Navigator  DIAGNOSIS:    ICD-10-CM   1. Malignant neoplasm of upper-outer quadrant of left breast in female, estrogen receptor negative ([place])  C50.412    Z17.1     SUMMARY OF ONCOLOGIC HISTORY: Oncology History  Malignant neoplasm of upper-outer quadrant of left breast in female, estrogen receptor negative ([place])  11/08/2018 Initial Diagnosis   Routine screening mammogram detected 2 masses in the left breast, measuring 1.6cm and 1.1cm, spanning a total of 3cm. Biopsy confirmed IDC with DCIS, HER-2 negative by FISH (2+ by IHC), ER/PR negative, Ki67 80%.    11/18/2018 Breast MRI   2.9 centimeter bilobed mass in the UOQ of the left breast, consistent with known malignancy. 0.5 centimeter satellite nodule is 0.5 centimeters below this mass.   11/22/2018 Cancer Staging   Staging form: Breast, AJCC 8th Edition - Clinical stage from 11/22/2018: Stage IIB (cT2, cN0, cM0, G3, ER-, PR-, HER2-) - Signed by Nicholas Lose, MD on 11/22/2018   11/27/2018 Genetic Testing   Patient had genetic testing done for a personal and family history of breast cancer.  Results were negative on the [facility] Breast cancer STAT gene panel. The STAT Breast cancer panel offered by [facility] includes sequencing and rearrangement analysis for the following 7 genes:  BRCA1, BRCA2, CDH1, PALB2, PTEN, STK11 and TP53.  The report date is 11/27/2018.   12/01/2018 - 01/25/2019 Chemotherapy   The patient had DOXOrubicin (ADRIAMYCIN) chemo injection 114 mg, 60 mg/m2 = 114 mg, Intravenous,  Once, 4 of 4 cycles Dose modification: 50 mg/m2 (original dose 60 mg/m2, Cycle 2, Reason: Dose not tolerated), 40 mg/m2 (original dose 60 mg/m2, Cycle 4, Reason: Dose not tolerated) Administration: 114 mg (12/01/2018), 96 mg (12/15/2018), 96 mg  (12/29/2018), 76 mg (01/12/2019) palonosetron (ALOXI) injection 0.25 mg, 0.25 mg, Intravenous,  Once, 5 of 8 cycles Administration: 0.25 mg (12/01/2018), 0.25 mg (01/25/2019), 0.25 mg (12/15/2018), 0.25 mg (12/29/2018), 0.25 mg (01/12/2019) pegfilgrastim-jmdb (FULPHILA) injection 6 mg, 6 mg, Subcutaneous,  Once, 4 of 4 cycles Administration: 6 mg (12/03/2018), 6 mg (12/17/2018), 6 mg (12/31/2018), 6 mg (01/14/2019) CARBOplatin (PARAPLATIN) 510 mg in sodium chloride 0.9 % 250 mL chemo infusion, 510 mg (106.9 % of original dose 478.8 mg), Intravenous,  Once, 1 of 4 cycles Dose modification:   (original dose 478.8 mg, Cycle 5) Administration: 510 mg (01/25/2019) cyclophosphamide (CYTOXAN) 1,140 mg in sodium chloride 0.9 % 250 mL chemo infusion, 600 mg/m2 = 1,140 mg, Intravenous,  Once, 4 of 4 cycles Dose modification: 500 mg/m2 (original dose 600 mg/m2, Cycle 2, Reason: Dose not tolerated), 400 mg/m2 (original dose 600 mg/m2, Cycle 4, Reason: Dose not tolerated) Administration: 1,140 mg (12/01/2018), 960 mg (12/15/2018), 960 mg (12/29/2018), 760 mg (01/12/2019) PACLitaxel (TAXOL) 150 mg in sodium chloride 0.9 % 250 mL chemo infusion (</= 65m/m2), 80 mg/m2 = 150 mg, Intravenous,  Once, 1 of 4 cycles Administration: 150 mg (01/25/2019) fosaprepitant (EMEND) 150 mg, dexamethasone (DECADRON) 12 mg in sodium chloride 0.9 % 145 mL IVPB, , Intravenous,  Once, 5 of 8 cycles Administration:  (12/01/2018),  (01/25/2019),  (12/15/2018),  (12/29/2018),  (01/12/2019)  for chemotherapy treatment.    03/29/2019 Surgery   Left lumpectomy ([name]: scattered microscopic foci of high grade DCIS with no invasive carcinoma, clear margins, 2 left axillary lymph nodes negative.  05/03/2019 -  Radiation Therapy   Adjuvant radiation therapy     CHIEF COMPLIANT: Follow-up of triple negative left breast cancer   INTERVAL HISTORY: Carol Byrd is a 74 y.o. with above-mentioned history of triple negative left breast cancerwho  completedneoadjuvant chemotherapy, underwent a left lumpectomy, and is currently undergoing radiation therapy. She presents to the clinic today for follow-up.    I have reviewed the past medical history, past surgical history, social history and family history with the patient and they are unchanged from previous note.  ALLERGIES:  is allergic to alpha-gal.  MEDICATIONS:  Current Outpatient Medications  Medication Sig Dispense Refill  . atorvastatin (LIPITOR) 20 MG tablet Take 20 mg by mouth See admin instructions. Takes in the evening on Tuesdays only    . Biotin 5000 MCG TABS Take 5,000 mcg by mouth daily. 30 tablet   . cholecalciferol (VITAMIN D3) 25 MCG (1000 UT) tablet Take 2,000 Units by mouth daily.    Marland Kitchen ezetimibe (ZETIA) 10 MG tablet Take 10 mg by mouth at bedtime.    Marland Kitchen levothyroxine (SYNTHROID) 75 MCG tablet Take 75 mcg by mouth daily.    . Methylcellulose, Laxative, (CITRUCEL) 500 MG TABS Take 500 mg by mouth daily.    . pantoprazole (PROTONIX) 40 MG tablet Take 40 mg by mouth 2 (two) times daily before a meal.    . Polyethyl Glycol-Propyl Glycol (SYSTANE) 0.4-0.3 % SOLN Place 1 drop into both eyes at bedtime as needed (dry eyes).    Marland Kitchen telmisartan (MICARDIS) 40 MG tablet Take 40 mg by mouth daily.     No current facility-administered medications for this visit.    PHYSICAL EXAMINATION: ECOG PERFORMANCE STATUS: 1 - Symptomatic but completely ambulatory  Vitals:   05/24/19 1434  BP: 110/72  Pulse: 77  Resp: 17  Temp: (!) 97.5 F (36.4 C)  SpO2: 99%   Filed Weights   05/24/19 1434  Weight: 157 lb 6.4 oz (71.4 kg)    LABORATORY DATA:  I have reviewed the data as listed CMP Latest Ref Rng & Units 03/14/2019 02/23/2019 02/16/2019  Glucose 70 - 99 mg/dL 85 116(H) 102(H)  BUN 8 - 23 mg/dL 27(H) 19 23  Creatinine 0.44 - 1.00 mg/dL 1.19(H) 0.92 0.89  Sodium 135 - 145 mmol/L 142 141 140  Potassium 3.5 - 5.1 mmol/L 4.4 3.9 4.3  Chloride 98 - 111 mmol/L 105 107 106  CO2  22 - 32 mmol/L _0 Calcium 8.9 - 10.3 mg/dL 9.2 8.7(L) 8.8(L)  Total Protein 6.5 - 8.1 g/dL 7.0 6.7 6.4(L)  Total Bilirubin 0.3 - 1.2 mg/dL 0.5 0.6 0.6  Alkaline Phos 38 - 126 U/L 57 56 54  AST 15 - 41 U/L _1 ALT 0 - 44 U/L _2 Lab Results  Component Value Date   WBC 4.1 03/14/2019   HGB 9.9 (L) 03/14/2019   HCT 29.4 (L) 03/14/2019   MCV 106.9 (H) 03/14/2019   PLT 192 03/14/2019   NEUTROABS 2.4 03/14/2019    ASSESSMENT & PLAN:  Malignant neoplasm of upper-outer quadrant of left breast in female, estrogen receptor negative ([place]) 11/08/2018: Routine screening mammogram detected 2 masses in the left breast, measuring 1.6cm and 1.1cm, spanning a total of 3cm. Biopsy confirmed IDC with DCIS, HER-2 negative by FISH (2+ by IHC), ER/PR negative, Ki67 80%.  Staging: T2N0 stage IIb clinical stage Breast MRI: 11/18/2018: 2.9 cm bilobed mass UOQ left breast  Treatment plan 1.Neoadjuvant  chemotherapy with dose dense Adriamycin and Cytoxan x4 followed by Taxol and carboplatin completed 01/25/2019 2.05/28/18: Left lumpectomy [name]): scattered microscopic foci of high grade DCIS with no invasive carcinoma, clear margins, 2 left axillary lymph nodes negative. 2.Followed by adjuvant radiation 05/03/2019-05/24/2019 -------------------------------------------------------------------------------------------------- Treatment plan: surveillance Chemo toxicities: 1.  Mild peripheral neuropathy: Stable to improved 2.  Fatigue: Improved 3.  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  After that I can see her once a year for surveillance checks and follow-up.    No orders of the defined types were placed in this encounter.  The patient has a good understanding of the overall plan. she agrees with it. she will call with any problems that may develop before the next visit here.  Total time spent: 30 mins including face to  face time and time spent for planning, charting and coordination of care  Nicholas Lose, MD 05/24/2019  I, Cloyde Reams Dorshimer, am acting as scribe for Dr. Nicholas Lose.  I have reviewed the above document for accuracy and completeness, and I agree with the above.

## 2019-05-24 ENCOUNTER — Other Ambulatory Visit: Payer: Self-pay

## 2019-05-24 ENCOUNTER — Ambulatory Visit: Admission: RE | Admit: 2019-05-24 | Payer: Medicare Other | Source: Ambulatory Visit | Admitting: Radiation Oncology

## 2019-05-24 ENCOUNTER — Inpatient Hospital Stay: Payer: Medicare Other | Attending: Hematology and Oncology | Admitting: Hematology and Oncology

## 2019-05-24 ENCOUNTER — Ambulatory Visit
Admission: RE | Admit: 2019-05-24 | Discharge: 2019-05-24 | Disposition: A | Discharge status: Home / Self Care | Payer: Medicare Other | Source: Ambulatory Visit | Attending: Radiation Oncology | Admitting: Radiation Oncology

## 2019-05-24 DIAGNOSIS — C50412 Malignant neoplasm of upper-outer quadrant of left female breast: Secondary | ICD-10-CM | POA: Diagnosis not present

## 2019-05-24 DIAGNOSIS — Z171 Estrogen receptor negative status [ER-]: Secondary | ICD-10-CM | POA: Diagnosis not present

## 2019-05-24 DIAGNOSIS — Z9221 Personal history of antineoplastic chemotherapy: Secondary | ICD-10-CM | POA: Diagnosis not present

## 2019-05-24 DIAGNOSIS — Z510 Encounter for antineoplastic radiation therapy: Secondary | ICD-10-CM | POA: Diagnosis not present

## 2019-05-24 NOTE — Assessment & Plan Note (Signed)
11/08/2018: Routine screening mammogram detected 2 masses in the left breast, measuring 1.6cm and 1.1cm, spanning a total of 3cm. Biopsy confirmed IDC with DCIS, HER-2 negative by FISH (2+ by IHC), ER/PR negative, Ki67 80%.  Staging: T2N0 stage IIb clinical stage Breast MRI: 11/18/2018: 2.9 cm bilobed mass UOQ left breast  Treatment plan 1.Neoadjuvant chemotherapy with dose dense Adriamycin and Cytoxan x4 followed by Taxol and carboplatin completed 01/25/2019 2.05/28/18: Left lumpectomy Carol Byrd): scattered microscopic foci of high grade DCIS with no invasive carcinoma, clear margins, 2 left axillary lymph nodes negative. 2.Followed by adjuvant radiation 05/03/2019-05/24/2019 -------------------------------------------------------------------------------------------------- Treatment plan: surveillance  Return to clinic in 3 months for survivorship care plan visit.  After that I can see her once a year for surveillance checks and follow-up.

## 2019-05-25 ENCOUNTER — Other Ambulatory Visit: Payer: Self-pay

## 2019-05-25 ENCOUNTER — Telehealth: Payer: Self-pay | Admitting: Hematology and Oncology

## 2019-05-25 ENCOUNTER — Ambulatory Visit
Admission: RE | Admit: 2019-05-25 | Discharge: 2019-05-25 | Disposition: A | Discharge status: Home / Self Care | Payer: Medicare Other | Source: Ambulatory Visit | Attending: Radiation Oncology | Admitting: Radiation Oncology

## 2019-05-25 DIAGNOSIS — Z171 Estrogen receptor negative status [ER-]: Secondary | ICD-10-CM | POA: Diagnosis not present

## 2019-05-25 DIAGNOSIS — C50412 Malignant neoplasm of upper-outer quadrant of left female breast: Secondary | ICD-10-CM | POA: Diagnosis not present

## 2019-05-25 DIAGNOSIS — Z510 Encounter for antineoplastic radiation therapy: Secondary | ICD-10-CM | POA: Diagnosis not present

## 2019-05-25 NOTE — Telephone Encounter (Signed)
I talk with patient regarding schedule  

## 2019-05-26 ENCOUNTER — Ambulatory Visit
Admission: RE | Admit: 2019-05-26 | Discharge: 2019-05-26 | Disposition: A | Discharge status: Home / Self Care | Payer: Medicare Other | Source: Ambulatory Visit | Attending: Radiation Oncology | Admitting: Radiation Oncology

## 2019-05-26 ENCOUNTER — Other Ambulatory Visit: Payer: Self-pay

## 2019-05-26 DIAGNOSIS — Z171 Estrogen receptor negative status [ER-]: Secondary | ICD-10-CM | POA: Diagnosis not present

## 2019-05-26 DIAGNOSIS — C50412 Malignant neoplasm of upper-outer quadrant of left female breast: Secondary | ICD-10-CM | POA: Diagnosis not present

## 2019-05-26 DIAGNOSIS — Z510 Encounter for antineoplastic radiation therapy: Secondary | ICD-10-CM | POA: Diagnosis not present

## 2019-05-27 ENCOUNTER — Ambulatory Visit
Admission: RE | Admit: 2019-05-27 | Discharge: 2019-05-27 | Disposition: A | Discharge status: Home / Self Care | Payer: Medicare Other | Source: Ambulatory Visit | Attending: Radiation Oncology | Admitting: Radiation Oncology

## 2019-05-27 ENCOUNTER — Other Ambulatory Visit: Payer: Self-pay

## 2019-05-27 DIAGNOSIS — Z510 Encounter for antineoplastic radiation therapy: Secondary | ICD-10-CM | POA: Diagnosis not present

## 2019-05-27 DIAGNOSIS — Z171 Estrogen receptor negative status [ER-]: Secondary | ICD-10-CM | POA: Diagnosis not present

## 2019-05-27 DIAGNOSIS — C50412 Malignant neoplasm of upper-outer quadrant of left female breast: Secondary | ICD-10-CM | POA: Diagnosis not present

## 2019-05-30 ENCOUNTER — Other Ambulatory Visit: Payer: Self-pay

## 2019-05-30 ENCOUNTER — Ambulatory Visit
Admission: RE | Admit: 2019-05-30 | Discharge: 2019-05-30 | Disposition: A | Discharge status: Home / Self Care | Payer: Medicare Other | Source: Ambulatory Visit | Attending: Radiation Oncology | Admitting: Radiation Oncology

## 2019-05-30 DIAGNOSIS — Z171 Estrogen receptor negative status [ER-]: Secondary | ICD-10-CM | POA: Diagnosis not present

## 2019-05-30 DIAGNOSIS — Z510 Encounter for antineoplastic radiation therapy: Secondary | ICD-10-CM | POA: Diagnosis not present

## 2019-05-30 DIAGNOSIS — C50412 Malignant neoplasm of upper-outer quadrant of left female breast: Secondary | ICD-10-CM | POA: Diagnosis not present

## 2019-05-31 ENCOUNTER — Other Ambulatory Visit: Payer: Self-pay

## 2019-05-31 ENCOUNTER — Encounter: Payer: Self-pay | Admitting: Radiation Oncology

## 2019-05-31 ENCOUNTER — Ambulatory Visit
Admission: RE | Admit: 2019-05-31 | Discharge: 2019-05-31 | Disposition: A | Discharge status: Home / Self Care | Payer: Medicare Other | Source: Ambulatory Visit | Attending: Radiation Oncology | Admitting: Radiation Oncology

## 2019-05-31 ENCOUNTER — Encounter: Payer: Self-pay | Admitting: *Deleted

## 2019-05-31 DIAGNOSIS — C50412 Malignant neoplasm of upper-outer quadrant of left female breast: Secondary | ICD-10-CM | POA: Diagnosis not present

## 2019-05-31 DIAGNOSIS — Z171 Estrogen receptor negative status [ER-]: Secondary | ICD-10-CM | POA: Diagnosis not present

## 2019-05-31 DIAGNOSIS — Z510 Encounter for antineoplastic radiation therapy: Secondary | ICD-10-CM | POA: Diagnosis not present

## 2019-06-01 NOTE — Progress Notes (Incomplete)
  Patient Name: Carol Byrd MRN: [id number] DOB: 28-Jan-1946 Referring Physician: [name] (Profile Not Attached) Date of Service: 05/31/2019 [facility], [place]                                                        End Of Treatment Note  Diagnoses: C50.412-Malignant neoplasm of upper-outer quadrant of left female breast  Cancer Staging: Stage IIB (T2N0) Left Breast UOQ, Ductal Carcinoma DCIS, ER- / PR- / Her2-, High-grade   Intent: Curative  Radiation Treatment Dates: 05/02/2019 through 05/31/2019 Site Technique Total Dose (Gy) Dose per Fx (Gy) Completed Fx Beam Energies  Breast, Left: Breast_Lt 3D 40.05/40.05 2.67 15/15 6X, 10X  Breast, Left: Breast_Lt_Bst 3D 10/10 2 5/5 6X, 10X   Narrative: The patient tolerated radiation therapy relatively well. Patient did report some mild fatigue and rare sharp pain/tenderness in the left breast. During treatment, there was some mild erythema and hyperpigmentation changes noted. There was some radiation dermatitis present in the upper inner aspect of the left breast towards the end of treatment. No skin breakdown.  Plan: The patient will follow-up with radiation oncology in one month.  ________________________________________________   Blair Promise, PhD, MD  This document serves as a record of services personally performed by Gery Pray, MD. It was created on his behalf by Clerance Lav, a trained medical scribe. The creation of this record is based on the scribe's personal observations and the provider's statements to them. This document has been checked and approved by the attending provider.

## 2019-06-19 ENCOUNTER — Encounter: Payer: Self-pay | Admitting: Hematology and Oncology

## 2019-06-23 ENCOUNTER — Encounter: Payer: Self-pay | Admitting: Radiation Oncology

## 2019-06-25 ENCOUNTER — Encounter: Payer: Self-pay | Admitting: Hematology and Oncology

## 2019-06-27 DIAGNOSIS — L0291 Cutaneous abscess, unspecified: Secondary | ICD-10-CM | POA: Diagnosis not present

## 2019-07-05 ENCOUNTER — Other Ambulatory Visit: Payer: Self-pay | Admitting: General Surgery

## 2019-07-05 DIAGNOSIS — N611 Abscess of the breast and nipple: Secondary | ICD-10-CM

## 2019-07-06 ENCOUNTER — Telehealth: Payer: Self-pay | Admitting: *Deleted

## 2019-07-06 ENCOUNTER — Other Ambulatory Visit: Payer: Self-pay | Admitting: General Surgery

## 2019-07-06 ENCOUNTER — Ambulatory Visit
Admission: RE | Admit: 2019-07-06 | Discharge: 2019-07-06 | Disposition: A | Discharge status: Home / Self Care | Payer: Medicare Other | Source: Ambulatory Visit | Attending: General Surgery | Admitting: General Surgery

## 2019-07-06 ENCOUNTER — Telehealth: Payer: Self-pay

## 2019-07-06 ENCOUNTER — Other Ambulatory Visit: Payer: Medicare Other

## 2019-07-06 ENCOUNTER — Other Ambulatory Visit: Payer: Self-pay

## 2019-07-06 DIAGNOSIS — N611 Abscess of the breast and nipple: Secondary | ICD-10-CM

## 2019-07-06 DIAGNOSIS — N6321 Unspecified lump in the left breast, upper outer quadrant: Secondary | ICD-10-CM | POA: Diagnosis not present

## 2019-07-06 DIAGNOSIS — N6489 Other specified disorders of breast: Secondary | ICD-10-CM | POA: Diagnosis not present

## 2019-07-06 DIAGNOSIS — R922 Inconclusive mammogram: Secondary | ICD-10-CM | POA: Diagnosis not present

## 2019-07-06 HISTORY — DX: Personal history of antineoplastic chemotherapy: Z92.21

## 2019-07-06 NOTE — Telephone Encounter (Signed)
CALLED PATIENT TO ALTER FU APPT. DUE TO WEATHER, APPT. RESCHEDULED FOR 07-21-19 @ 9 AM, LVM FOR A RETURN CALL

## 2019-07-06 NOTE — Telephone Encounter (Signed)
Left VM for pt re: any lingering side effects from XRT, inquired if pt had sought Dr. Cristal Generous advice about lump in breast, and offered pt to reschedule tomorrow's appt with Dr. Sondra Come due to forecasted bad weather. This RN's direct number left with instructions to return call. Await return call. Loma Sousa, RN BSN

## 2019-07-07 ENCOUNTER — Ambulatory Visit: Payer: Medicare Other | Admitting: Radiation Oncology

## 2019-07-11 DIAGNOSIS — E039 Hypothyroidism, unspecified: Secondary | ICD-10-CM | POA: Diagnosis not present

## 2019-07-11 DIAGNOSIS — Z79899 Other long term (current) drug therapy: Secondary | ICD-10-CM | POA: Diagnosis not present

## 2019-07-11 LAB — AEROBIC/ANAEROBIC CULTURE W GRAM STAIN (SURGICAL/DEEP WOUND)

## 2019-07-12 DIAGNOSIS — Z91018 Allergy to other foods: Secondary | ICD-10-CM | POA: Diagnosis not present

## 2019-07-13 ENCOUNTER — Ambulatory Visit
Admission: RE | Admit: 2019-07-13 | Discharge: 2019-07-13 | Disposition: A | Discharge status: Home / Self Care | Payer: Medicare Other | Source: Ambulatory Visit | Attending: General Surgery | Admitting: General Surgery

## 2019-07-13 ENCOUNTER — Other Ambulatory Visit: Payer: Self-pay

## 2019-07-13 ENCOUNTER — Other Ambulatory Visit: Payer: Self-pay | Admitting: General Surgery

## 2019-07-13 DIAGNOSIS — N6489 Other specified disorders of breast: Secondary | ICD-10-CM | POA: Diagnosis not present

## 2019-07-13 DIAGNOSIS — T8189XA Other complications of procedures, not elsewhere classified, initial encounter: Secondary | ICD-10-CM | POA: Diagnosis not present

## 2019-07-13 DIAGNOSIS — N611 Abscess of the breast and nipple: Secondary | ICD-10-CM

## 2019-07-14 DIAGNOSIS — L0291 Cutaneous abscess, unspecified: Secondary | ICD-10-CM | POA: Diagnosis not present

## 2019-07-14 DIAGNOSIS — C50412 Malignant neoplasm of upper-outer quadrant of left female breast: Secondary | ICD-10-CM | POA: Diagnosis not present

## 2019-07-18 ENCOUNTER — Ambulatory Visit
Admission: RE | Admit: 2019-07-18 | Discharge: 2019-07-18 | Disposition: A | Discharge status: Home / Self Care | Payer: Medicare Other | Source: Ambulatory Visit | Attending: Radiation Oncology | Admitting: Radiation Oncology

## 2019-07-18 ENCOUNTER — Encounter: Payer: Self-pay | Admitting: Radiation Oncology

## 2019-07-18 ENCOUNTER — Other Ambulatory Visit: Payer: Self-pay

## 2019-07-18 VITALS — BP 155/73 | HR 77 | Temp 98.3°F | Resp 18 | Ht 66.0 in | Wt 158.4 lb

## 2019-07-18 DIAGNOSIS — Z171 Estrogen receptor negative status [ER-]: Secondary | ICD-10-CM | POA: Insufficient documentation

## 2019-07-18 DIAGNOSIS — Z923 Personal history of irradiation: Secondary | ICD-10-CM | POA: Insufficient documentation

## 2019-07-18 DIAGNOSIS — C50412 Malignant neoplasm of upper-outer quadrant of left female breast: Secondary | ICD-10-CM | POA: Diagnosis not present

## 2019-07-18 DIAGNOSIS — Z79899 Other long term (current) drug therapy: Secondary | ICD-10-CM | POA: Insufficient documentation

## 2019-07-18 NOTE — Progress Notes (Signed)
Ms. Haubner presents today for f/u with Dr. Sondra Come. Pt denies c/o pain. Pt reports fatigue from XRT is resolving. Pt is using Sonafine on breast. Breast skin is WNL. Incision is bright red, very firm. Pt reports has had fluid drawn off twice, last time was last week by Dr. Nolon Nations at the [facility]. Pt has upcoming appt with Dr. Nolon Nations 07/20/19 for re-eval of area.   BP (!) 155/73 (BP Location: Right Arm, Patient Position: Sitting)   Pulse 77   Temp 98.3 F (36.8 C) (Temporal)   Resp 18   Ht 5\' 6"  (1.676 m)   Wt 158 lb 6 oz (71.8 kg)   SpO2 100%   BMI 25.56 kg/m   Wt Readings from Last 3 Encounters:  07/18/19 158 lb 6 oz (71.8 kg)  05/24/19 157 lb 6.4 oz (71.4 kg)  04/07/19 155 lb 6.4 oz (70.5 kg)   Loma Sousa, RN BSN

## 2019-07-18 NOTE — Progress Notes (Signed)
Radiation Oncology         [phone] ________________________________  Name: Carol Byrd MRN: [id number]  Date: 07/18/2019  DOB: 1945/09/15  Follow-Up Visit Note  CC: [name], MD  [name], MD    ICD-10-CM   1. Malignant neoplasm of upper-outer quadrant of left breast in female, estrogen receptor negative ([place])  C50.412    Z17.1     Diagnosis: StageIIB(T2N0)LeftBreast UOQ,DuctalCarcinomaDCIS, ER-/ PR-/ Her2-,High-grade  Interval Since Last Radiation: One month, two weeks, and three days.  Radiation Treatment Dates: 05/02/2019 through 05/31/2019 Site Technique Total Dose (Gy) Dose per Fx (Gy) Completed Fx Beam Energies  Breast, Left: Breast_Lt 3D 40.05/40.05 2.67 15/15 6X, 10X  Breast, Left: Breast_Lt_Bst 3D 10/10 2 5/5 6X, 10X    Narrative:  The patient returns today for routine follow-up. The patient's initial one-month appointment was rescheduled secondary to bad weather.  Few weeks after the patient completed her radiation therapy she began developing some swelling in the upper outer aspect of the breast and erythema.  She was seen by general surgery and is now being seen in the breast center.  The patient was placed on Septra initially and then doxycycline which improved the situation significantly.  Patient did require fluid drainage has been several occasions and is scheduled for possible aspiration of fluid drainage later this week with Dr. Owens Shark at the breast center.  Patient denies any significant pain in the breast nipple discharge or bleeding.  She denies any swelling in her left arm or hand.                 ALLERGIES:  is allergic to alpha-gal.  Meds: Current Outpatient Medications  Medication Sig Dispense Refill  . atorvastatin (LIPITOR) 20 MG tablet Take 20 mg by mouth See admin instructions. Takes in the evening on Tuesdays only    . Biotin 5000 MCG TABS Take 5,000 mcg by mouth daily. 30 tablet   . cholecalciferol (VITAMIN D3) 25 MCG  (1000 UT) tablet Take 2,000 Units by mouth daily.    Marland Kitchen EPINEPHrine 0.3 mg/0.3 mL IJ SOAJ injection Inject into the muscle.    . ezetimibe (ZETIA) 10 MG tablet Take 10 mg by mouth at bedtime.    Marland Kitchen levothyroxine (SYNTHROID) 75 MCG tablet Take 75 mcg by mouth daily.    . Methylcellulose, Laxative, (CITRUCEL) 500 MG TABS Take 500 mg by mouth daily.    . pantoprazole (PROTONIX) 40 MG tablet Take 40 mg by mouth 2 (two) times daily before a meal.    . Polyethyl Glycol-Propyl Glycol (SYSTANE) 0.4-0.3 % SOLN Place 1 drop into both eyes at bedtime as needed (dry eyes).    Marland Kitchen telmisartan (MICARDIS) 40 MG tablet Take 40 mg by mouth daily.     No current facility-administered medications for this encounter.    Physical Findings: The patient is in no acute distress. Patient is alert and oriented.  height is 5' 6"  (1.676 m) and weight is 158 lb 6 oz (71.8 kg). Her temporal temperature is 98.3 F (36.8 C). Her blood pressure is 155/73 (abnormal) and her pulse is 77. Her respiration is 18 and oxygen saturation is 100%. .   Lungs are clear to auscultation bilaterally. Heart has regular rate and rhythm. No palpable cervical, supraclavicular, or axillary adenopathy. Abdomen soft, non-tender, normal bowel sounds. Left breast: Skin is well-healed from her radiation therapy.  Along her scar in the upper outer quadrant she has some erythema laterally but no obvious signs of infection.  There is induration underneath the lumpectomy scar estimated to be approximately 3.5 x 4 cm consistent with possible seroma no nipple discharge or bleeding.   Lab Findings: Lab Results  Component Value Date   WBC 4.1 03/14/2019   HGB 9.9 (L) 03/14/2019   HCT 29.4 (L) 03/14/2019   MCV 106.9 (H) 03/14/2019   PLT 192 03/14/2019    Radiographic Findings: US BREAST LTD UNI LEFT INC AXILLA  Result Date: 07/13/2019 CLINICAL DATA:  Patient returns for evaluation of the LEFT breast. History of LEFT lumpectomy in November 2020. Patient  had surgical incision and drainage on 06/27/2019. Patient was treated with a course of Septra. She had ultrasound-guided aspiration of lumpectomy cavity on 07/06/2019, yielding 6 ml of pink, cloudy fluid. Culture and sensitivity showed Staphylococcus epidermis sensitive to tetracycline. Patient has just completed a course of doxycycline. She notes a minimal sensitivity at the lumpectomy site but overall feels much better. EXAM: ULTRASOUND OF THE LEFT BREAST COMPARISON:  07/06/2019 and earlier FINDINGS: On physical exam, there is residual 8-9 centimeter area of erythema overlying the lumpectomy cavity. I palpate a 10 centimeter tense mass in the area of erythema. Well-healed sites of incision and drainage and previous ultrasound-guided aspiration. There is no drainage today. Patient denies any significant drainage since aspiration. Targeted ultrasound is performed, showing a heterogeneous collection of fluid and air in the 1 o'clock location of the LEFT breast 10 centimeters from the nipple, measuring 3.0 centimeters. IMPRESSION: Persistent heterogeneous collection of the lumpectomy cavity, associated with improving signs of infection. I discussed the findings with the patient. Aspiration is performed on the same day and dictated separately. RECOMMENDATION: Aspiration of LEFT lumpectomy cavity. I have discussed the findings and recommendations with the patient. If applicable, a reminder letter will be sent to the patient regarding the next appointment. BI-RADS CATEGORY  2: Benign. Electronically Signed   By: Nolon Nations M.D.   On: 07/13/2019 15:28   US BREAST LTD UNI LEFT INC AXILLA  Result Date: 07/06/2019 : CLINICAL DATA:   Patient has redness and tenderness at the surgical site since lumpectomy was performed in November 2020. On 06/27/2019 the patient is seen at [facility] surgery and 15 ml of cloudy fluid was aspirated from the lumpectomy site. Patient had the incision packed for several days.  Patient completed a course of Bactrim without significant improvement, and was started on doxycycline yesterday. EXAM: DIGITAL DIAGNOSTIC LEFT MAMMOGRAM WITH CAD AND TOMO ULTRASOUND LEFT BREAST COMPARISON:   03/29/2019 ACR Breast Density Category c: The breast tissue is heterogeneously dense, which may obscure small masses. FINDINGS: There is indistinct mass in the UPPER-OUTER QUADRANT of the LEFT breast, associated with surgical clips and consistent with lumpectomy scar. Skin of the LEFT breast is thickened compared to prior studies.  Mammographic images were processed with CAD. On physical exam, there is a 20 centimeter area of erythema in the UPPER-OUTER QUADRANT of the LEFT breast, most focally inflamed centrally, where I palpate a mass in the 2 o'clock location. There is no drainage or skin breakdown. Targeted ultrasound is performed, showing an irregular collection in the 2 o'clock location of the LEFT breast 9 centimeters from the nipple measuring 2.8 x 2.6 x 2.4 centimeters. The collection contains numerous internal septations. IMPRESSION: Fluid collection in the UPPER-OUTER QUADRANT of the LEFT breast consistent with seroma cavity or abscess. RECOMMENDATION: Recommend ultrasound-guided aspiration of fluid collection in the [place] 2 sent for microbiology and for symptomatic relief. Following aspiration, recommend LEFT breast ultrasound  in 1 week. I have discussed the findings and recommendations with the patient. If applicable, a reminder letter will be sent to the patient regarding the next appointment. BI-RADS CATEGORY  2: Benign. Electronically Signed   By: Nolon Nations M.D.   On: 07/06/2019 15:38   MM DIAG BREAST TOMO UNI LEFT  Result Date: 07/06/2019 CLINICAL DATA:  Patient has redness and tenderness at the surgical site since lumpectomy was performed in November 2020. On 06/27/2019 the patient is seen at [facility] surgery and 15 ml of cloudy fluid was aspirated from the  lumpectomy site. Patient had the incision packed for several days. Patient completed a course of Bactrim without significant improvement, and was started on doxycycline yesterday. EXAM: DIGITAL DIAGNOSTIC LEFT MAMMOGRAM WITH CAD AND TOMO ULTRASOUND LEFT BREAST COMPARISON:  03/29/2019 ACR Breast Density Category c: The breast tissue is heterogeneously dense, which may obscure small masses. FINDINGS: There is indistinct mass in the UPPER-OUTER QUADRANT of the LEFT breast, associated with surgical clips and consistent with lumpectomy scar. Skin of the LEFT breast is thickened compared to prior studies. Mammographic images were processed with CAD. On physical exam, there is a 20 centimeter area of erythema in the UPPER-OUTER QUADRANT of the LEFT breast, most focally inflamed centrally, where I palpate a mass in the 2 o'clock location. There is no drainage or skin breakdown. Targeted ultrasound is performed, showing an irregular collection in the 2 o'clock location of the LEFT breast 9 centimeters from the nipple measuring 2.8 x 2.6 x 2.4 centimeters. The collection contains numerous internal septations. IMPRESSION: Fluid collection in the UPPER-OUTER QUADRANT of the LEFT breast consistent with seroma cavity or abscess. RECOMMENDATION: Recommend ultrasound-guided aspiration of fluid collection in the [place] 2 sent for microbiology and for symptomatic relief. Following aspiration, recommend LEFT breast ultrasound in 1 week. I have discussed the findings and recommendations with the patient. If applicable, a reminder letter will be sent to the patient regarding the next appointment. BI-RADS CATEGORY  2: Benign. Electronically Signed   By: Nolon Nations M.D.   On: 07/06/2019 15:06   US BREAST ASPIRATION LEFT  Addendum Date: 07/15/2019   ADDENDUM REPORT: 07/14/2019 08:08 ADDENDUM: LEFT breast aspiration yielded Gram Stain-ABUNDANT WBC PRESENT,BOTH PMN AND MONONUCLEAR, NO ORGANISMS SEEN. Culture-RARE  STAPHYLOCOCCUS EPIDERMIDIS, NO ANAEROBES ISOLATED. The patient was notified of results in person by Dr. Shon Hale on July 13, 2019, at the time of LEFT breast ultrasound with aspiration. These procedures are dictated in separate reports. The patient reported minimal sensitivity at the lumpectomy site but overall feels much better and has just completed a course of doxycycline. The patient is scheduled to return to [facility] on July 20, 2019 for LEFT breast ultrasound with possible aspiration to ensure continued healing and resolution of the abscess. She was instructed to call for any additional questions or concerns. Pathology results reported by Terie Purser, RN on 07/14/2019. Electronically Signed   By: Nolon Nations M.D.   On: 07/14/2019 08:08   Result Date: 07/15/2019 CLINICAL DATA:  Complex fluid collection at lumpectomy site, associated with clinical findings of infection. EXAM: ULTRASOUND GUIDED LEFT BREAST CYST ASPIRATION COMPARISON:  Previous exams. PROCEDURE: Using sterile technique, 1% lidocaine, under direct ultrasound visualization, needle aspiration of LEFT lumpectomy site was performed. 9 ml of pink cloudy fluid was aspirated and sent for microbiology. IMPRESSION: Ultrasound-guided aspiration of LEFT lumpectomy cavity no apparent complications. RECOMMENDATIONS: Recommended continued treatment with doxycycline. Recommend LEFT breast ultrasound in 1  week. Electronically Signed: By: Nolon Nations M.D. On: 07/06/2019 15:36   US BREAST ASPIRATION LEFT  Result Date: 07/13/2019 CLINICAL DATA:  Persistent LEFT lumpectomy fluid collection. EXAM: ULTRASOUND GUIDED LEFT BREAST CYST ASPIRATION COMPARISON:  07/06/2019 and earlier PROCEDURE: Using sterile technique, 1% lidocaine, under direct ultrasound visualization, needle aspiration of LEFT lumpectomy site was performed using an 11 gauge introducer. 5 ml of light clear fluid was aspirated and discarded. IMPRESSION:  Ultrasound-guided aspiration of LEFT lumpectomy site no apparent complications. RECOMMENDATIONS: Recommend follow-up LEFT breast ultrasound 1 week. Electronically Signed   By: Nolon Nations M.D.   On: 07/13/2019 15:29    Impression: StageIIB(T2N0)LeftBreast UOQ,DuctalCarcinomaDCIS, ER-/ PR-/ Her2-,High-grade  Patient has recovered well from her radiation therapy but as above has issues with seroma.  She is being followed very closely at the breast center concerning this issue.  Plan:  She is scheduled for a left breast ultrasound on 07/20/2019. She will follow-up with radiation oncology in three months and with Dr. Lindi Adie on 08/30/2019.  ____________________________________   Blair Promise, PhD, MD  This document serves as a record of services personally performed by Gery Pray, MD. It was created on his behalf by Clerance Lav, a trained medical scribe. The creation of this record is based on the scribe's personal observations and the provider's statements to them. This document has been checked and approved by the attending provider.

## 2019-07-18 NOTE — Patient Instructions (Signed)
Coronavirus (COVID-19) Are you at risk?  Are you at risk for the Coronavirus (COVID-19)?  To be considered HIGH RISK for Coronavirus (COVID-19), you have to meet the following criteria:  . Traveled to [place], [place], [place], [place] or [place]; or in the [place] to [place], [place], [place], or [place]; and have fever, cough, and shortness of breath within the last 2 weeks of travel OR . Been in close contact with a person diagnosed with COVID-19 within the last 2 weeks and have fever, cough, and shortness of breath . IF YOU DO NOT MEET THESE CRITERIA, YOU ARE CONSIDERED LOW RISK FOR COVID-19.  What to do if you are HIGH RISK for COVID-19?  . If you are having a medical emergency, call 911. . Seek medical care right away. Before you go to a doctor's office, urgent care or emergency department, call ahead and tell them about your recent travel, contact with someone diagnosed with COVID-19, and your symptoms. You should receive instructions from your physician's office regarding next steps of care.  . When you arrive at healthcare provider, tell the healthcare staff immediately you have returned from visiting [place], [place], [place], [place] or [place]; or traveled in the [place] to [place], [place], [place], or [place]; in the last two weeks or you have been in close contact with a person diagnosed with COVID-19 in the last 2 weeks.   . Tell the health care staff about your symptoms: fever, cough and shortness of breath. . After you have been seen by a medical provider, you will be either: o Tested for (COVID-19) and discharged home on quarantine except to seek medical care if symptoms worsen, and asked to  - Stay home and avoid contact with others until you get your results (4-5 days)  - Avoid travel on public transportation if possible (such as bus, train, or airplane) or o Sent to the Emergency Department by EMS for evaluation, COVID-19 testing, and possible  admission depending on your condition and test results.  What to do if you are LOW RISK for COVID-19?  Reduce your risk of any infection by using the same precautions used for avoiding the common cold or flu:  . Wash your hands often with soap and warm water for at least 20 seconds.  If soap and water are not readily available, use an alcohol-based hand sanitizer with at least 60% alcohol.  . If coughing or sneezing, cover your mouth and nose by coughing or sneezing into the elbow areas of your shirt or coat, into a tissue or into your sleeve (not your hands). . Avoid shaking hands with others and consider head nods or verbal greetings only. . Avoid touching your eyes, nose, or mouth with unwashed hands.  . Avoid close contact with people who are sick. . Avoid places or events with large numbers of people in one location, like concerts or sporting events. . Carefully consider travel plans you have or are making. . If you are planning any travel outside or inside the [place], visit the CDC's Travelers' Health webpage for the latest health notices. . If you have some symptoms but not all symptoms, continue to monitor at home and seek medical attention if your symptoms worsen. . If you are having a medical emergency, call 911.   ADDITIONAL HEALTHCARE OPTIONS FOR PATIENTS  [facility] / e-Visit: [email or web address]         [facility]: [phone]  [facility]: [phone]                   [facility]: [phone]   

## 2019-07-19 ENCOUNTER — Telehealth: Payer: Self-pay | Admitting: *Deleted

## 2019-07-19 DIAGNOSIS — D649 Anemia, unspecified: Secondary | ICD-10-CM | POA: Diagnosis not present

## 2019-07-19 DIAGNOSIS — E039 Hypothyroidism, unspecified: Secondary | ICD-10-CM | POA: Diagnosis not present

## 2019-07-19 NOTE — Telephone Encounter (Signed)
CALLED PATIENT TO INFORM OF FU WITH DR. KINARD ON 10-24-19 @ 3:30 PM, LVM FOR A RETURN CALL

## 2019-07-20 ENCOUNTER — Other Ambulatory Visit: Payer: Self-pay

## 2019-07-20 ENCOUNTER — Other Ambulatory Visit: Payer: Self-pay | Admitting: General Surgery

## 2019-07-20 ENCOUNTER — Ambulatory Visit
Admission: RE | Admit: 2019-07-20 | Discharge: 2019-07-20 | Disposition: A | Discharge status: Home / Self Care | Payer: Medicare Other | Source: Ambulatory Visit | Attending: General Surgery | Admitting: General Surgery

## 2019-07-20 ENCOUNTER — Encounter: Payer: Self-pay | Admitting: Hematology and Oncology

## 2019-07-20 DIAGNOSIS — N611 Abscess of the breast and nipple: Secondary | ICD-10-CM

## 2019-07-20 DIAGNOSIS — N6489 Other specified disorders of breast: Secondary | ICD-10-CM | POA: Diagnosis not present

## 2019-07-21 ENCOUNTER — Ambulatory Visit: Payer: Medicare Other | Admitting: Radiation Oncology

## 2019-08-02 ENCOUNTER — Other Ambulatory Visit: Payer: Self-pay

## 2019-08-02 ENCOUNTER — Ambulatory Visit
Admission: RE | Admit: 2019-08-02 | Discharge: 2019-08-02 | Disposition: A | Discharge status: Home / Self Care | Payer: Medicare Other | Source: Ambulatory Visit | Attending: General Surgery | Admitting: General Surgery

## 2019-08-02 ENCOUNTER — Other Ambulatory Visit: Payer: Self-pay | Admitting: General Surgery

## 2019-08-02 DIAGNOSIS — N611 Abscess of the breast and nipple: Secondary | ICD-10-CM

## 2019-08-02 DIAGNOSIS — L7682 Other postprocedural complications of skin and subcutaneous tissue: Secondary | ICD-10-CM | POA: Diagnosis not present

## 2019-08-03 ENCOUNTER — Other Ambulatory Visit: Payer: Medicare Other

## 2019-08-03 DIAGNOSIS — Z91018 Allergy to other foods: Secondary | ICD-10-CM | POA: Diagnosis not present

## 2019-08-05 DIAGNOSIS — L0291 Cutaneous abscess, unspecified: Secondary | ICD-10-CM | POA: Diagnosis not present

## 2019-08-30 ENCOUNTER — Encounter: Payer: Self-pay | Admitting: Adult Health

## 2019-08-30 ENCOUNTER — Inpatient Hospital Stay: Payer: Medicare Other | Attending: Hematology and Oncology | Admitting: Adult Health

## 2019-08-30 ENCOUNTER — Other Ambulatory Visit: Payer: Self-pay

## 2019-08-30 VITALS — BP 126/76 | HR 72 | Temp 98.7°F | Resp 18 | Ht 66.0 in | Wt 160.8 lb

## 2019-08-30 DIAGNOSIS — Z9221 Personal history of antineoplastic chemotherapy: Secondary | ICD-10-CM | POA: Diagnosis not present

## 2019-08-30 DIAGNOSIS — C50412 Malignant neoplasm of upper-outer quadrant of left female breast: Secondary | ICD-10-CM | POA: Diagnosis not present

## 2019-08-30 DIAGNOSIS — Z8349 Family history of other endocrine, nutritional and metabolic diseases: Secondary | ICD-10-CM | POA: Diagnosis not present

## 2019-08-30 DIAGNOSIS — Z807 Family history of other malignant neoplasms of lymphoid, hematopoietic and related tissues: Secondary | ICD-10-CM | POA: Diagnosis not present

## 2019-08-30 DIAGNOSIS — Z853 Personal history of malignant neoplasm of breast: Secondary | ICD-10-CM | POA: Insufficient documentation

## 2019-08-30 DIAGNOSIS — Z800 Family history of malignant neoplasm of digestive organs: Secondary | ICD-10-CM | POA: Insufficient documentation

## 2019-08-30 DIAGNOSIS — Z8249 Family history of ischemic heart disease and other diseases of the circulatory system: Secondary | ICD-10-CM | POA: Diagnosis not present

## 2019-08-30 DIAGNOSIS — Z803 Family history of malignant neoplasm of breast: Secondary | ICD-10-CM | POA: Diagnosis not present

## 2019-08-30 DIAGNOSIS — Z87891 Personal history of nicotine dependence: Secondary | ICD-10-CM | POA: Insufficient documentation

## 2019-08-30 DIAGNOSIS — Z801 Family history of malignant neoplasm of trachea, bronchus and lung: Secondary | ICD-10-CM | POA: Diagnosis not present

## 2019-08-30 DIAGNOSIS — Z923 Personal history of irradiation: Secondary | ICD-10-CM | POA: Insufficient documentation

## 2019-08-30 DIAGNOSIS — Z171 Estrogen receptor negative status [ER-]: Secondary | ICD-10-CM | POA: Diagnosis not present

## 2019-08-30 DIAGNOSIS — I10 Essential (primary) hypertension: Secondary | ICD-10-CM | POA: Diagnosis not present

## 2019-08-30 DIAGNOSIS — Z808 Family history of malignant neoplasm of other organs or systems: Secondary | ICD-10-CM | POA: Insufficient documentation

## 2019-08-30 DIAGNOSIS — Z79899 Other long term (current) drug therapy: Secondary | ICD-10-CM | POA: Diagnosis not present

## 2019-08-31 ENCOUNTER — Telehealth: Payer: Self-pay | Admitting: Adult Health

## 2019-08-31 NOTE — Telephone Encounter (Signed)
Scheduled appts per 4/13 los. Left voicemail with new appt date and time. Sent msg to HIM to mail reminder letter and calendar.

## 2019-09-01 NOTE — Progress Notes (Signed)
SURVIVORSHIP VISIT:   BRIEF ONCOLOGIC HISTORY:  Oncology History  Malignant neoplasm of upper-outer quadrant of left breast in female, estrogen receptor negative ([place])  11/08/2018 Initial Diagnosis   Routine screening mammogram detected 2 masses in the left breast, measuring 1.6cm and 1.1cm, spanning a total of 3cm. Biopsy confirmed IDC with DCIS, HER-2 negative by FISH (2+ by IHC), ER/PR negative, Ki67 80%.    11/18/2018 Breast MRI   2.9 centimeter bilobed mass in the UOQ of the left breast, consistent with known malignancy. 0.5 centimeter satellite nodule is 0.5 centimeters below this mass.   11/22/2018 Cancer Staging   Staging form: Breast, AJCC 8th Edition - Clinical stage from 11/22/2018: Stage IIB (cT2, cN0, cM0, G3, ER-, PR-, HER2-)    11/27/2018 Genetic Testing   Patient had genetic testing done for a personal and family history of breast cancer.  Results were negative on the [facility] Breast cancer STAT gene panel. The STAT Breast cancer panel offered by [facility] includes sequencing and rearrangement analysis for the following 7 genes:  BRCA1, BRCA2, CDH1, PALB2, PTEN, STK11 and TP53.  The report date is 11/27/2018.   12/01/2018 - 01/25/2019 Chemotherapy   DOXOrubicin (ADRIAMYCIN) chemo injection 114 mg, 60 mg/m2 = 114 mg, Intravenous,  Once, 4 of 4 cycles. Dose modification: 50 mg/m2 (original dose 60 mg/m2, Cycle 2, Reason: Dose not tolerated), 40 mg/m2 (original dose 60 mg/m2, Cycle 4, Reason: Dose not tolerated). Administration: 114 mg (12/01/2018), 96 mg (12/15/2018), 96 mg (12/29/2018), 76 mg (01/12/2019)  palonosetron (ALOXI) injection 0.25 mg, 0.25 mg, Intravenous,  Once, 5 of 8 cycles. Administration: 0.25 mg (12/01/2018), 0.25 mg (01/25/2019), 0.25 mg (12/15/2018), 0.25 mg (12/29/2018), 0.25 mg (01/12/2019)  pegfilgrastim-jmdb (FULPHILA) injection 6 mg, 6 mg, Subcutaneous,  Once, 4 of 4 cycles. Administration: 6 mg (12/03/2018), 6 mg (12/17/2018), 6 mg (12/31/2018), 6 mg (01/14/2019)  CARBOplatin  (PARAPLATIN) 510 mg in sodium chloride 0.9 % 250 mL chemo infusion, 510 mg (106.9 % of original dose 478.8 mg), Intravenous,  Once, 1 of 4 cycles. Dose modification: (original dose 478.8 mg, Cycle 5). Administration: 510 mg (01/25/2019)  cyclophosphamide (CYTOXAN) 1,140 mg in sodium chloride 0.9 % 250 mL chemo infusion, 600 mg/m2 = 1,140 mg, Intravenous,  Once, 4 of 4 cycles. Dose modification: 500 mg/m2 (original dose 600 mg/m2, Cycle 2, Reason: Dose not tolerated), 400 mg/m2 (original dose 600 mg/m2, Cycle 4, Reason: Dose not tolerated). Administration: 1,140 mg (12/01/2018), 960 mg (12/15/2018), 960 mg (12/29/2018), 760 mg (01/12/2019)  PACLitaxel (TAXOL) 150 mg in sodium chloride 0.9 % 250 mL chemo infusion (</= '80mg'$ /m2), 80 mg/m2 = 150 mg, Intravenous,  Once, 1 of 4 cycles Administration: 150 mg (01/25/2019)  fosaprepitant (EMEND) 150 mg   dexamethasone (DECADRON) 12 mg in sodium chloride 0.9 % 145 mL IVPB, , Intravenous,  Once, 5 of 8 cycles Administration:  (12/01/2018),  (01/25/2019),  (12/15/2018),  (12/29/2018),  (01/12/2019)   03/29/2019 Surgery   Left lumpectomy [name]) [phone]): scattered microscopic foci of high grade DCIS with no invasive carcinoma, clear margins, 2 left axillary lymph nodes negative.   03/29/2019 Cancer Staging   Staging form: Breast, AJCC 8th Edition - Pathologic stage from 03/29/2019: No Stage Recommended (ypTis (DCIS), pN0, cM0)    05/03/2019 - 05/31/2019 Radiation Therapy   The patient initially received a dose of 40.05 Gy in 15 fractions to the breast using whole-breast tangent fields. This was delivered using a 3-D conformal technique. The pt received a boost delivering an additional 10 Gy in 5 fractions using  a electron boost with 2mV electrons. The total dose was 50.05 Gy.      INTERVAL HISTORY:  Ms. CSandato review her survivorship care plan detailing her treatment course for breast cancer, as well as monitoring long-term side effects of that  treatment, education regarding health maintenance, screening, and overall wellness and health promotion.     Overall, Ms. CTuelreports feeling moderately well today.  She took the survivorship survey and notes that she does have some cognitive difficulties with her thinking and slight fatigue.  She has some mild breast pain, and has been known to occasionally snore.  She is otherwise feeling pretty well.    REVIEW OF SYSTEMS:  Review of Systems  Constitutional: Positive for fatigue. Negative for appetite change, chills, fever and unexpected weight change.  HENT:   Negative for hearing loss, lump/mass and trouble swallowing.   Eyes: Negative for eye problems and icterus.  Respiratory: Negative for chest tightness, cough and shortness of breath.   Cardiovascular: Negative for chest pain, leg swelling and palpitations.  Gastrointestinal: Negative for abdominal distention, abdominal pain, constipation, diarrhea, nausea and vomiting.  Endocrine: Negative for hot flashes.  Genitourinary: Negative for difficulty urinating.   Musculoskeletal: Negative for arthralgias.  Skin: Negative for itching and rash.  Neurological: Negative for dizziness, extremity weakness, headaches and numbness.  Hematological: Negative for adenopathy. Does not bruise/bleed easily.  Psychiatric/Behavioral: Negative for depression. The patient is not nervous/anxious.    Breast: Denies any new nodularity, masses, tenderness, nipple changes, or nipple discharge.      ONCOLOGY TREATMENT TEAM:  1. Surgeon:  [facility]. WDonne [facility]at [facility] 2. Medical Oncologist: Dr. gLindi Adie 3. Radiation Oncologist: Dr. KSondra Come   PAST MEDICAL/SURGICAL HISTORY:  Past Medical History:  Diagnosis Date  . Alopecia   . [place]   . Cervical disc disease   . Essential hypertension   . Family history of breast cancer   . Family history of lung cancer   . Family history of melanoma   . Family history of multiple  myeloma   . Family history of stomach cancer   . GERD (gastroesophageal reflux disease)   . Hypothyroidism   . Impaired fasting glucose   . Mixed hyperlipidemia   . Personal history of chemotherapy   . Vitamin D deficiency    Past Surgical History:  Procedure Laterality Date  . BACK SURGERY  1985   bulging lumbar disk  . BIOPSY  04/14/2018   Procedure: BIOPSY;  Surgeon: RRogene [place] MD;  [facility];  Service: Endoscopy;;  esophagus  . BREAST LUMPECTOMY Left 03/2019  . BREAST LUMPECTOMY WITH RADIOACTIVE SEED AND SENTINEL LYMPH NODE BIOPSY Left 03/29/2019   Procedure: LEFT BREAST LUMPECTOMY WITH RADIOACTIVE SEED AND LEFT AXILLARY SENTINEL LYMPH NODE BIOPSY BLUE DYE INJECTION;  Surgeon: WRolm Bookbinder MD;  [place]  Service: General;  Laterality: Left;  . cataracts    . COLONOSCOPY N/A 02/14/2015   Procedure: COLONOSCOPY;  Surgeon: NRogene [place] MD;  [facility];  Service: Endoscopy;  Laterality: N/A;  1200  . ESOPHAGEAL DILATION N/A 04/14/2018   Procedure: ESOPHAGEAL DILATION;  Surgeon: RRogene [place] MD;  [facility];  Service: Endoscopy;  Laterality: N/A;  . ESOPHAGOGASTRODUODENOSCOPY N/A 04/14/2018   Procedure: ESOPHAGOGASTRODUODENOSCOPY (EGD);  Surgeon: RRogene [place] MD;  [facility];  Service: Endoscopy;  Laterality: N/A;  3:10  . PORT-A-CATH REMOVAL Right 03/29/2019   Procedure: REMOVAL  PORT-A-CATH;  Surgeon: Rolm Bookbinder, MD;  Location: [place];  Service: General;  Laterality: Right;  . PORTACATH PLACEMENT Right 11/30/2018   Procedure: INSERTION PORT-A-CATH WITH ULTRASOUND;  Surgeon: Rolm Bookbinder, MD;  Location: [place];  Service: General;  Laterality: Right;  . RETINAL TEAR REPAIR CRYOTHERAPY    . THYROIDECTOMY       ALLERGIES:  Allergies  Allergen Reactions  . Alpha-Gal Anaphylaxis, Itching and Swelling     CURRENT MEDICATIONS:  Outpatient  Encounter Medications as of 08/30/2019  Medication Sig  . atorvastatin (LIPITOR) 20 MG tablet Take 20 mg by mouth See admin instructions. Takes in the evening on Tuesdays only  . Biotin 5000 MCG TABS Take 5,000 mcg by mouth daily.  . cholecalciferol (VITAMIN D3) 25 MCG (1000 UT) tablet Take 2,000 Units by mouth daily.  Marland Kitchen EPINEPHrine 0.3 mg/0.3 mL IJ SOAJ injection Inject into the muscle.  . ezetimibe (ZETIA) 10 MG tablet Take 10 mg by mouth at bedtime.  Marland Kitchen levothyroxine (SYNTHROID) 75 MCG tablet Take 75 mcg by mouth daily.  . Methylcellulose, Laxative, (CITRUCEL) 500 MG TABS Take 500 mg by mouth daily.  . pantoprazole (PROTONIX) 40 MG tablet Take 40 mg by mouth 2 (two) times daily before a meal.  . Polyethyl Glycol-Propyl Glycol (SYSTANE) 0.4-0.3 % SOLN Place 1 drop into both eyes at bedtime as needed (dry eyes).  Marland Kitchen telmisartan (MICARDIS) 40 MG tablet Take 40 mg by mouth daily.  . [DISCONTINUED] prochlorperazine (COMPAZINE) 10 MG tablet TAKE ONE TABLET (10MG TOTAL) BY MOUTH EVERY SIX HOURS AS NEEDED FOR NAUSEA OR VOMITING   No facility-administered encounter medications on file as of 08/30/2019.     ONCOLOGIC FAMILY HISTORY:  Family History  Problem Relation Age of Onset  . Depression Mother   . Hypertension Mother   . Heart attack Father   . Heart disease Other   . Multiple myeloma Cousin 52       maternal first cousin  . Breast cancer Sister 63       second breast cancer diagnosed at 36  . Breast cancer Maternal Aunt 80  . Cancer Maternal Uncle        unknown  . Stomach cancer Maternal Grandfather 80  . Lung cancer Maternal Uncle 49  . Lung cancer Maternal Uncle 61  . Lung cancer Cousin        maternal first cousins  . Cancer Cousin        unknown, paternal first cousins  . Colon cancer Neg Hx      GENETIC COUNSELING/TESTING: See above  SOCIAL HISTORY:  Social History   Socioeconomic History  . Marital status: Married    Spouse name: Not on file  . Number of  children: Not on file  . Years of education: Not on file  . Highest education level: Not on file  Occupational History  . Occupation: retired  Tobacco Use  . Smoking status: Former Smoker    Types: Cigarettes    Quit date: 04/14/1994    Years since quitting: 25.4  . Smokeless tobacco: Never Used  Substance and Sexual Activity  . Alcohol use: Not Currently    Alcohol/week: 2.0 standard drinks    Types: 2 Glasses of wine per week    Comment: wine but not recently  . Drug use: No  . Sexual activity: Not on file  Other Topics Concern  . Not on file  Social History Narrative  . Not on file   Social Determinants  of Health   Financial Resource Strain:   . Difficulty of Paying Living Expenses:   Food Insecurity:   . Worried About Charity fundraiser in the Last Year:   . Arboriculturist in the Last Year:   Transportation Needs:   . Film/video editor (Medical):   Marland Kitchen Lack of Transportation (Non-Medical):   Physical Activity:   . Days of Exercise per Week:   . Minutes of Exercise per Session:   Stress:   . Feeling of Stress :   Social Connections:   . Frequency of Communication with Friends and Family:   . Frequency of Social Gatherings with Friends and Family:   . Attends Religious Services:   . Active Member of Clubs or Organizations:   . Attends Archivist Meetings:   Marland Kitchen Marital Status:   Intimate Partner Violence:   . Fear of Current or Ex-Partner:   . Emotionally Abused:   Marland Kitchen Physically Abused:   . Sexually Abused:      OBSERVATIONS/OBJECTIVE:  BP 126/76 (BP Location: Left Arm, Patient Position: Sitting)   Pulse 72   Temp 98.7 F (37.1 C) (Temporal)   Resp 18   Ht '5\' 6"'$  (1.676 m)   Wt 160 lb 12.8 oz (72.9 kg)   SpO2 96%   BMI 25.95 kg/m  GENERAL: Patient is a well appearing female in no acute distress HEENT:  Sclerae anicteric.  Mask in place. Neck is supple.  NODES:  No cervical, supraclavicular, or axillary lymphadenopathy palpated.  BREAST  EXAM:  S/p left lumpectomy, no sign of local recurrence, right breast benign LUNGS:  Clear to auscultation bilaterally.  No wheezes or rhonchi. HEART:  Regular rate and rhythm. No murmur appreciated. ABDOMEN:  Soft, nontender.  Positive, normoactive bowel sounds. No organomegaly palpated. MSK:  No focal spinal tenderness to palpation. Full range of motion bilaterally in the upper extremities. EXTREMITIES:  No peripheral edema.   SKIN:  Clear with no obvious rashes or skin changes. No nail dyscrasia. NEURO:  Nonfocal. Well oriented.  Appropriate affect.    LABORATORY DATA:  None for this visit.  DIAGNOSTIC IMAGING:  None for this visit.      ASSESSMENT AND PLAN:  Ms.. Robart is a pleasant 74 y.o. female with Stage IIB left breast invasive ductal carcinoma, ER-/PR-/HER2-, diagnosed in 10/2018, treated with neoadjuvant chemotherapy, lumpectomy, and adjuvant radiation therapy.  She presents to the Survivorship Clinic for our initial meeting and routine follow-up post-completion of treatment for breast cancer.    1. Stage IIB left breast cancer:  Ms. Yasin is continuing to recover from definitive treatment for breast cancer. She will follow-up with her medical oncologist, Dr. Lindi Adie in 6 months with history and physical exam per surveillance protocol. . Her mammogram is due 10/2019.  She did screen positive for cognitive dysfunction on today's survivorship survey.  I offered her an appointment to see Dr. Mickeal Skinner, our neuro oncologist, and she will think about this.    Today, a comprehensive survivorship care plan and treatment summary was reviewed with the patient today detailing her breast cancer diagnosis, treatment course, potential late/long-term effects of treatment, appropriate follow-up care with recommendations for the future, and patient education resources.  A copy of this summary, along with a letter will be sent to the patient's primary care provider via mail/fax/In Basket message  after today's visit.    2. Bone health:  Given Ms. Voyles's age/history of breast cancer, she is at risk for bone demineralization.  She was given education on specific activities to promote bone health.  3. Cancer screening:  Due to Ms. Domke's history and her age, she should receive screening for skin cancers, colon cancer, and gynecologic cancers.  The information and recommendations are listed on the patient's comprehensive care plan/treatment summary and were reviewed in detail with the patient.    4. Health maintenance and wellness promotion: Ms. Steinhilber was encouraged to consume 5-7 servings of fruits and vegetables per day. We reviewed the "Nutrition Rainbow" handout, as well as the handout "Take Control of Your Health and Reduce Your Cancer Risk" from the [place].  She was also encouraged to engage in moderate to vigorous exercise for 30 minutes per day most days of the week. We discussed the [employer] fitness program, which is designed for cancer survivors to help them become more physically fit after cancer treatments.  She was instructed to limit her alcohol consumption and continue to abstain from tobacco use.   5. Support services/counseling: It is not uncommon for this period of the patient's cancer care trajectory to be one of many emotions and stressors.  We discussed how this can be increasingly difficult during the times of quarantine and social distancing due to the COVID-19 pandemic.   She was given information regarding our available services and encouraged to contact me with any questions or for help enrolling in any of our support group/programs.    Follow up instructions:    -Return to cancer center in 6 months for f/u with Dr. Lindi Adie  -Mammogram due in 10/2019 -Follow up with Dr. Chrissie Noa in 11/2019 -She is welcome to return back to the Survivorship Clinic at any time; no additional follow-up needed at this time.  -Consider referral back to  survivorship as a long-term survivor for continued surveillance  The patient was provided an opportunity to ask questions and all were answered. The patient agreed with the plan and demonstrated an understanding of the instructions.   Total encounter time: 30 minutes*  Wilber Bihari, NP 09/01/19 4:22 AM Medical Oncology and Hematology [facility] [place], [place] [address] Tel. [phone]    Fax. [phone]  *Total Encounter Time as defined by the Centers for Medicare and Medicaid Services includes, in addition to the face-to-face time of a patient visit (documented in the note above) non-face-to-face time: obtaining and reviewing outside history, ordering and reviewing medications, tests or procedures, care coordination (communications with other health care professionals or caregivers) and documentation in the medical record.

## 2019-09-06 DIAGNOSIS — C50412 Malignant neoplasm of upper-outer quadrant of left female breast: Secondary | ICD-10-CM | POA: Diagnosis not present

## 2019-10-24 ENCOUNTER — Ambulatory Visit: Payer: Self-pay | Admitting: Radiation Oncology

## 2019-10-24 DIAGNOSIS — H524 Presbyopia: Secondary | ICD-10-CM | POA: Diagnosis not present

## 2019-10-24 DIAGNOSIS — Z961 Presence of intraocular lens: Secondary | ICD-10-CM | POA: Diagnosis not present

## 2019-10-31 ENCOUNTER — Other Ambulatory Visit: Payer: Self-pay | Admitting: General Surgery

## 2019-10-31 ENCOUNTER — Other Ambulatory Visit: Payer: Self-pay

## 2019-10-31 ENCOUNTER — Ambulatory Visit: Admission: RE | Admit: 2019-10-31 | Payer: Medicare Other | Source: Ambulatory Visit

## 2019-10-31 ENCOUNTER — Ambulatory Visit
Admission: RE | Admit: 2019-10-31 | Discharge: 2019-10-31 | Disposition: A | Discharge status: Home / Self Care | Payer: Medicare Other | Source: Ambulatory Visit | Attending: General Surgery | Admitting: General Surgery

## 2019-10-31 DIAGNOSIS — Z853 Personal history of malignant neoplasm of breast: Secondary | ICD-10-CM

## 2019-10-31 DIAGNOSIS — Z9889 Other specified postprocedural states: Secondary | ICD-10-CM

## 2019-10-31 DIAGNOSIS — N611 Abscess of the breast and nipple: Secondary | ICD-10-CM

## 2019-10-31 DIAGNOSIS — R922 Inconclusive mammogram: Secondary | ICD-10-CM | POA: Diagnosis not present

## 2019-10-31 HISTORY — DX: Personal history of irradiation: Z92.3

## 2019-11-02 NOTE — Progress Notes (Signed)
Radiation Oncology         [phone] ________________________________  Name: Carol Byrd MRN: [id number]  Date: 11/03/2019  DOB: 1946/02/24  Follow-Up Visit Note  CC: [name], MD  [name], MD    ICD-10-CM   1. Malignant neoplasm of upper-outer quadrant of left breast in female, estrogen receptor negative ([place])  C50.412    Z17.1     Diagnosis: StageIIB(T2N0)LeftBreast UOQ,DuctalCarcinomaDCIS, ER-/ PR-/ Her2-,High-grade  Interval Since Last Radiation: Five months and five days  Radiation Treatment Dates: 05/02/2019 through 05/31/2019 Site Technique Total Dose (Gy) Dose per Fx (Gy) Completed Fx Beam Energies  Breast, Left: Breast_Lt 3D 40.05/40.05 2.67 15/15 6X, 10X  Breast, Left: Breast_Lt_Bst 3D 10/10 2 5/5 6X, 10X    Narrative:  The patient returns today for routine follow-up. Since her last visit, she had a left breast ultrasound on 07/20/2019 that showed clinically improving infection at the lumpectomy site in the upper outer left breast. There was a re-accumulation of probable sterile fluid in the seroma cavity. She was given a course of Doxycycline and then had a repeat left breast ultrasound on 08/02/2019 that showed a decrease in the size of the lumpectomy seroma. The patient's symptoms had significantly improved and she did not have a flare up of the symptoms since finishing her antibiotics.  She was seen by Wilber Bihari, NP, on 08/30/2019 to review her survivorship plan. At that time, she screened positive for cognitive dysfunction on the survivorship survey and was offered an appointment to see Dr. Mickeal Skinner, neuro-oncologist. She stated that she would think about it.  She underwent a bilateral diagnostic mammogram on 10/31/2019 that showed expected post-lumpectomy changes and residual mild post-radiation changes involving the left breast. There was no mammographic evidence of malignancy involving either breast.  On review of systems, she  reports mild swelling in the left breast but denies any pain in this area.  She denies any nipple discharge or bleeding. She denies swelling in her left arm or hand..  ALLERGIES:  is allergic to alpha-gal.  Meds: Current Outpatient Medications  Medication Sig Dispense Refill  . atorvastatin (LIPITOR) 20 MG tablet Take 20 mg by mouth See admin instructions. Takes in the evening on Tuesdays only    . cholecalciferol (VITAMIN D3) 25 MCG (1000 UT) tablet Take 2,000 Units by mouth daily.    Marland Kitchen EPINEPHrine 0.3 mg/0.3 mL IJ SOAJ injection Inject into the muscle.    . ezetimibe (ZETIA) 10 MG tablet Take 10 mg by mouth at bedtime.    Marland Kitchen levothyroxine (SYNTHROID) 75 MCG tablet Take 75 mcg by mouth daily.    . Methylcellulose, Laxative, (CITRUCEL) 500 MG TABS Take 500 mg by mouth daily.    . pantoprazole (PROTONIX) 40 MG tablet Take 40 mg by mouth 2 (two) times daily before a meal.    . Polyethyl Glycol-Propyl Glycol (SYSTANE) 0.4-0.3 % SOLN Place 1 drop into both eyes at bedtime as needed (dry eyes).    Marland Kitchen telmisartan (MICARDIS) 40 MG tablet Take 40 mg by mouth daily.    . Biotin 5000 MCG TABS Take 5,000 mcg by mouth daily. (Patient not taking: Reported on 11/03/2019) 30 tablet    No current facility-administered medications for this encounter.    Physical Findings: The patient is in no acute distress. Patient is alert and oriented.  height is _0  (1.676 m) and weight is 162 lb (73.5 kg). Her temporal temperature is 97.6 F (36.4 C). Her blood pressure is 137/61 and her pulse  is 63. Her oxygen saturation is 98%. .   Lungs are clear to auscultation bilaterally. Heart has regular rate and rhythm. No palpable cervical, supraclavicular, or axillary adenopathy. Abdomen soft, non-tender, normal bowel sounds. Right breast: No palpable mass, nipple discharge, or bleeding.  Left breast: Minimal hyperpigmentation changes.  No dominant mass appreciated in the breast nipple discharge or bleeding.  Very minimal  edema noted   Lab Findings: Lab Results  Component Value Date   WBC 4.1 03/14/2019   HGB 9.9 (L) 03/14/2019   HCT 29.4 (L) 03/14/2019   MCV 106.9 (H) 03/14/2019   PLT 192 03/14/2019    Radiographic Findings: MM DIAG BREAST TOMO BILATERAL  Result Date: 10/31/2019 CLINICAL DATA:  Malignant lumpectomy of the UPPER OUTER QUADRANT of the LEFT breast in November, 2020 with adjuvant radiation therapy. Patient did not tolerate 1 of the chemotherapeutic agents used for adjuvant therapy. She developed an abscess at the lumpectomy site which was percutaneously drained in February. Annual evaluation. EXAM: DIGITAL DIAGNOSTIC BILATERAL MAMMOGRAM WITH TOMO AND CAD COMPARISON:  Previous exam(s). ACR Breast Density Category c: The breast tissue is heterogeneously dense, which may obscure small masses. FINDINGS: Tomosynthesis and synthesized full field CC and MLO views of both breasts were obtained. Standard spot magnification MLO view of the lumpectomy site LEFT breast tomosynthesis and synthesized spot compression CC and MLO views of a mammographic finding in the RIGHT breast were also obtained. Post surgical scar/architectural distortion at the lumpectomy site in the UPPER OUTER LEFT breast at POSTERIOR depth. No mammographic asymmetry at the lumpectomy site to suggest residual or recurrent abscess in this location. Residual mild post radiation skin thickening and trabecular thickening throughout the LEFT breast. No findings suspicious for malignancy in the LEFT breast. A focal asymmetry in the OUTER breast at MIDDLE depth on the full field images disperses with compression and there is no underlying mass or architectural distortion. No findings suspicious for malignancy in the RIGHT breast. Mammographic images were processed with CAD. IMPRESSION: 1. No mammographic evidence of malignancy involving either breast. 2. Expected post lumpectomy changes and residual mild post radiation changes involving the LEFT  breast. RECOMMENDATION: Screening mammogram in one year.(Code:[id number]-B-01Y) I have discussed the findings and recommendations with the patient. If applicable, a reminder letter will be sent to the patient regarding the next appointment. BI-RADS CATEGORY  2: Benign. Electronically Signed   By: Evangeline Dakin M.D.   On: 10/31/2019 11:53    Impression: StageIIB(T2N0)LeftBreast UOQ,DuctalCarcinomaDCIS, ER-/ PR-/ Her2-,High-grade  No clinical evidence of recurrence on examination today.  Plan:  The patient is scheduled to see Dr. Lindi Adie on 02/29/2020. Follow-up with radiation oncology in as-needed basis.  She continues to follow-up with surgery on a yearly basis.  Total time spent in this encounter was 15 minutes which included reviewing the patient's most recent ultrasounds, mammogram, interval history, physical examination, and documentation.  ____________________________________   Blair Promise, PhD, MD  This document serves as a record of services personally performed by Gery Pray, MD. It was created on his behalf by Clerance Lav, a trained medical scribe. The creation of this record is based on the scribe's personal observations and the provider's statements to them. This document has been checked and approved by the attending provider.

## 2019-11-03 ENCOUNTER — Ambulatory Visit
Admission: RE | Admit: 2019-11-03 | Discharge: 2019-11-03 | Disposition: A | Discharge status: Home / Self Care | Payer: Medicare Other | Source: Ambulatory Visit | Attending: Radiation Oncology | Admitting: Radiation Oncology

## 2019-11-03 ENCOUNTER — Other Ambulatory Visit: Payer: Self-pay

## 2019-11-03 ENCOUNTER — Encounter: Payer: Self-pay | Admitting: Radiation Oncology

## 2019-11-03 DIAGNOSIS — Z171 Estrogen receptor negative status [ER-]: Secondary | ICD-10-CM

## 2019-11-03 DIAGNOSIS — Z86000 Personal history of in-situ neoplasm of breast: Secondary | ICD-10-CM | POA: Diagnosis not present

## 2019-11-03 DIAGNOSIS — Z923 Personal history of irradiation: Secondary | ICD-10-CM | POA: Diagnosis not present

## 2019-11-03 DIAGNOSIS — C50412 Malignant neoplasm of upper-outer quadrant of left female breast: Secondary | ICD-10-CM

## 2019-11-03 DIAGNOSIS — Z79899 Other long term (current) drug therapy: Secondary | ICD-10-CM | POA: Diagnosis not present

## 2019-11-03 DIAGNOSIS — Z08 Encounter for follow-up examination after completed treatment for malignant neoplasm: Secondary | ICD-10-CM | POA: Diagnosis not present

## 2019-11-03 NOTE — Progress Notes (Signed)
Patient here for a f/u visit with Dr. Sondra Come. Patient denies pain other than " occasional zingers( brief shooting pains)" through both breasts. She denies problems with  ROM or swelling.  BP 137/61 (BP Location: Left Arm, Patient Position: Sitting)   Pulse 63   Temp 97.6 F (36.4 C) (Temporal)   Ht 5\' 6"  (1.676 m)   Wt 162 lb (73.5 kg)   SpO2 98%   BMI 26.15 kg/m    Wt Readings from Last 3 Encounters:  11/03/19 162 lb (73.5 kg)  08/30/19 160 lb 12.8 oz (72.9 kg)  07/18/19 158 lb 6 oz (71.8 kg)

## 2019-11-08 ENCOUNTER — Encounter: Payer: Self-pay | Admitting: Hematology and Oncology

## 2019-11-09 ENCOUNTER — Telehealth: Payer: Self-pay | Admitting: Hematology and Oncology

## 2019-11-09 NOTE — Telephone Encounter (Signed)
Scheduled per 6/22 sch message. Unable to reach pt. Left voicemail with appt time and date.

## 2019-11-18 DIAGNOSIS — C50412 Malignant neoplasm of upper-outer quadrant of left female breast: Secondary | ICD-10-CM | POA: Diagnosis not present

## 2019-11-18 DIAGNOSIS — I10 Essential (primary) hypertension: Secondary | ICD-10-CM | POA: Diagnosis not present

## 2019-11-18 DIAGNOSIS — K219 Gastro-esophageal reflux disease without esophagitis: Secondary | ICD-10-CM | POA: Diagnosis not present

## 2019-11-22 DIAGNOSIS — L821 Other seborrheic keratosis: Secondary | ICD-10-CM | POA: Diagnosis not present

## 2019-11-22 DIAGNOSIS — L578 Other skin changes due to chronic exposure to nonionizing radiation: Secondary | ICD-10-CM | POA: Diagnosis not present

## 2019-11-22 DIAGNOSIS — L720 Epidermal cyst: Secondary | ICD-10-CM | POA: Diagnosis not present

## 2019-11-22 DIAGNOSIS — L814 Other melanin hyperpigmentation: Secondary | ICD-10-CM | POA: Diagnosis not present

## 2019-11-22 DIAGNOSIS — D1801 Hemangioma of skin and subcutaneous tissue: Secondary | ICD-10-CM | POA: Diagnosis not present

## 2019-12-04 NOTE — Progress Notes (Signed)
Patient Care Team: [name], MD as PCP - General (Internal Medicine) Satira Sark, MD as PCP - Cardiology (Cardiology) Gery Pray, MD as Consulting Physician (Radiation Oncology) Nicholas Lose, MD as Consulting Physician (Hematology and Oncology) Rolm Bookbinder, MD as Consulting Physician (General Surgery)  DIAGNOSIS:    ICD-10-CM   1. Malignant neoplasm of upper-outer quadrant of left breast in female, estrogen receptor negative ([place])  C50.412    Z17.1     SUMMARY OF ONCOLOGIC HISTORY: Oncology History  Malignant neoplasm of upper-outer quadrant of left breast in female, estrogen receptor negative ([place])  11/08/2018 Initial Diagnosis   Routine screening mammogram detected 2 masses in the left breast, measuring 1.6cm and 1.1cm, spanning a total of 3cm. Biopsy confirmed IDC with DCIS, HER-2 negative by FISH (2+ by IHC), ER/PR negative, Ki67 80%.    11/18/2018 Breast MRI   2.9 centimeter bilobed mass in the UOQ of the left breast, consistent with known malignancy. 0.5 centimeter satellite nodule is 0.5 centimeters below this mass.   11/22/2018 Cancer Staging   Staging form: Breast, AJCC 8th Edition - Clinical stage from 11/22/2018: Stage IIB (cT2, cN0, cM0, G3, ER-, PR-, HER2-)    11/27/2018 Genetic Testing   Patient had genetic testing done for a personal and family history of breast cancer.  Results were negative on the [facility] Breast cancer STAT gene panel. The STAT Breast cancer panel offered by [facility] includes sequencing and rearrangement analysis for the following 7 genes:  BRCA1, BRCA2, CDH1, PALB2, PTEN, STK11 and TP53.  The report date is 11/27/2018.   12/01/2018 - 01/25/2019 Chemotherapy   DOXOrubicin (ADRIAMYCIN) chemo injection 114 mg, 60 mg/m2 = 114 mg, Intravenous,  Once, 4 of 4 cycles. Dose modification: 50 mg/m2 (original dose 60 mg/m2, Cycle 2, Reason: Dose not tolerated), 40 mg/m2 (original dose 60 mg/m2, Cycle 4, Reason: Dose not tolerated). Administration:  114 mg (12/01/2018), 96 mg (12/15/2018), 96 mg (12/29/2018), 76 mg (01/12/2019)  palonosetron (ALOXI) injection 0.25 mg, 0.25 mg, Intravenous,  Once, 5 of 8 cycles. Administration: 0.25 mg (12/01/2018), 0.25 mg (01/25/2019), 0.25 mg (12/15/2018), 0.25 mg (12/29/2018), 0.25 mg (01/12/2019)  pegfilgrastim-jmdb (FULPHILA) injection 6 mg, 6 mg, Subcutaneous,  Once, 4 of 4 cycles. Administration: 6 mg (12/03/2018), 6 mg (12/17/2018), 6 mg (12/31/2018), 6 mg (01/14/2019)  CARBOplatin (PARAPLATIN) 510 mg in sodium chloride 0.9 % 250 mL chemo infusion, 510 mg (106.9 % of original dose 478.8 mg), Intravenous,  Once, 1 of 4 cycles. Dose modification: (original dose 478.8 mg, Cycle 5). Administration: 510 mg (01/25/2019)  cyclophosphamide (CYTOXAN) 1,140 mg in sodium chloride 0.9 % 250 mL chemo infusion, 600 mg/m2 = 1,140 mg, Intravenous,  Once, 4 of 4 cycles. Dose modification: 500 mg/m2 (original dose 600 mg/m2, Cycle 2, Reason: Dose not tolerated), 400 mg/m2 (original dose 600 mg/m2, Cycle 4, Reason: Dose not tolerated). Administration: 1,140 mg (12/01/2018), 960 mg (12/15/2018), 960 mg (12/29/2018), 760 mg (01/12/2019)  PACLitaxel (TAXOL) 150 mg in sodium chloride 0.9 % 250 mL chemo infusion (</= 81m/m2), 80 mg/m2 = 150 mg, Intravenous,  Once, 1 of 4 cycles Administration: 150 mg (01/25/2019)  fosaprepitant (EMEND) 150 mg   dexamethasone (DECADRON) 12 mg in sodium chloride 0.9 % 145 mL IVPB, , Intravenous,  Once, 5 of 8 cycles Administration:  (12/01/2018),  (01/25/2019),  (12/15/2018),  (12/29/2018),  (01/12/2019)   03/29/2019 Surgery   Left lumpectomy ([name] ([phone]: scattered microscopic foci of high grade DCIS with no invasive carcinoma, clear margins, 2 left axillary lymph nodes negative.  03/29/2019 Cancer Staging   Staging form: Breast, AJCC 8th Edition - Pathologic stage from 03/29/2019: No Stage Recommended (ypTis (DCIS), pN0, cM0)    05/03/2019 - 05/31/2019 Radiation Therapy   The patient initially  received a dose of 40.05 Gy in 15 fractions to the breast using whole-breast tangent fields. This was delivered using a 3-D conformal technique. The pt received a boost delivering an additional 10 Gy in 5 fractions using a electron boost with 57mV electrons. The total dose was 50.05 Gy.      CHIEF COMPLIANT: Follow-up of triple negative left breast cancer   INTERVAL HISTORY: Carol Haverstickis a 74y.o. with above-mentioned history of triple negative left breast cancertreated withneoadjuvant chemotherapy, left lumpectomy, radiation, and is currently on surveillance.Mammogram on 6/14.21 showed no evidence of malignancy bilaterally. She presents to the clinic today for follow-up  ALLERGIES:  is allergic to alpha-gal.  MEDICATIONS:  Current Outpatient Medications  Medication Sig Dispense Refill  . atorvastatin (LIPITOR) 20 MG tablet Take 20 mg by mouth See admin instructions. Takes in the evening on Tuesdays only    . Biotin 5000 MCG TABS Take 5,000 mcg by mouth daily. (Patient not taking: Reported on 11/03/2019) 30 tablet   . cholecalciferol (VITAMIN D3) 25 MCG (1000 UT) tablet Take 2,000 Units by mouth daily.    .Marland KitchenEPINEPHrine 0.3 mg/0.3 mL IJ SOAJ injection Inject into the muscle.    . ezetimibe (ZETIA) 10 MG tablet Take 10 mg by mouth at bedtime.    .Marland Kitchenlevothyroxine (SYNTHROID) 75 MCG tablet Take 75 mcg by mouth daily.    . Methylcellulose, Laxative, (CITRUCEL) 500 MG TABS Take 500 mg by mouth daily.    . pantoprazole (PROTONIX) 40 MG tablet Take 40 mg by mouth 2 (two) times daily before a meal.    . Polyethyl Glycol-Propyl Glycol (SYSTANE) 0.4-0.3 % SOLN Place 1 drop into both eyes at bedtime as needed (dry eyes).    .Marland Kitchentelmisartan (MICARDIS) 40 MG tablet Take 40 mg by mouth daily.     No current facility-administered medications for this visit.    PHYSICAL EXAMINATION: ECOG PERFORMANCE STATUS: 1 - Symptomatic but completely ambulatory  Vitals:   12/05/19 1444  BP: 104/78    Pulse: 96  Resp: 17  Temp: 98.3 F (36.8 C)  SpO2: 98%   Filed Weights   12/05/19 1444  Weight: 159 lb 12.8 oz (72.5 kg)    BREAST: No palpable masses or nodules in either right or left breasts. No palpable axillary supraclavicular or infraclavicular adenopathy no breast tenderness or nipple discharge. (exam performed in the presence of a chaperone)  LABORATORY DATA:  I have reviewed the data as listed CMP Latest Ref Rng & Units 03/14/2019 02/23/2019 02/16/2019  Glucose 70 - 99 mg/dL 85 116(H) 102(H)  BUN 8 - 23 mg/dL 27(H) 19 23  Creatinine 0.44 - 1.00 mg/dL 1.19(H) 0.92 0.89  Sodium 135 - 145 mmol/L 142 141 140  Potassium 3.5 - 5.1 mmol/L 4.4 3.9 4.3  Chloride 98 - 111 mmol/L 105 107 106  CO2 22 - 32 mmol/L 24 24 24   Calcium 8.9 - 10.3 mg/dL 9.2 8.7(L) 8.8(L)  Total Protein 6.5 - 8.1 g/dL 7.0 6.7 6.4(L)  Total Bilirubin 0.3 - 1.2 mg/dL 0.5 0.6 0.6  Alkaline Phos 38 - 126 U/L 57 56 54  AST 15 - 41 U/L 16 16 15   ALT 0 - 44 U/L 14 13 9     Lab Results  Component Value Date  WBC 4.1 03/14/2019   HGB 9.9 (L) 03/14/2019   HCT 29.4 (L) 03/14/2019   MCV 106.9 (H) 03/14/2019   PLT 192 03/14/2019   NEUTROABS 2.4 03/14/2019    ASSESSMENT & PLAN:  Malignant neoplasm of upper-outer quadrant of left breast in female, estrogen receptor negative ([place]) 11/08/2018: Routine screening mammogram detected 2 masses in the left breast, measuring 1.6cm and 1.1cm, spanning a total of 3cm. Biopsy confirmed IDC with DCIS, HER-2 negative by FISH (2+ by IHC), ER/PR negative, Ki67 80%.  Staging: T2N0 stage IIb clinical stage Breast MRI: 11/18/2018: 2.9 cm bilobed mass UOQ left breast  Treatment plan 1.Neoadjuvant chemotherapy with dose dense Adriamycin and Cytoxan x4 followed by Taxol and carboplatincompleted 01/25/2019 2.05/28/18:Left lumpectomy [name]): scattered microscopic foci of high grade DCIS with no invasive carcinoma, clear margins, 2 left axillary lymph nodes  negative. 2.Followed by adjuvant radiation 05/03/2019-05/24/2019 -------------------------------------------------------------------------------------------------- Treatment plan: surveillance Breast cancer surveillance: 1.  Mammogram 10/31/2019: Benign no evidence of malignancy expected postlumpectomy changes.  Density C Breast exam: 12/05/2019: Benign  Chemo-induced peripheral neuropathy: Mild Survivorship: I discussed with her about 3 important things.  These include drinking a glass of water, 30 minutes of walking or any form of exercise daily, cutting down on the carbohydrates, and using an Insight timer app for meditation. Return to clinic in 6 months for follow-up after that once a year.    No orders of the defined types were placed in this encounter.  The patient has a good understanding of the overall plan. she agrees with it. she will call with any problems that may develop before the next visit here.  Total time spent: 20 mins including face to face time and time spent for planning, charting and coordination of care  Nicholas Lose, MD 12/05/2019  I, Cloyde Reams Dorshimer, am acting as scribe for Dr. Nicholas Lose.  I have reviewed the above documentation for accuracy and completeness, and I agree with the above.

## 2019-12-05 ENCOUNTER — Inpatient Hospital Stay: Payer: Medicare Other | Attending: Hematology and Oncology | Admitting: Hematology and Oncology

## 2019-12-05 ENCOUNTER — Other Ambulatory Visit: Payer: Self-pay

## 2019-12-05 DIAGNOSIS — Z923 Personal history of irradiation: Secondary | ICD-10-CM | POA: Diagnosis not present

## 2019-12-05 DIAGNOSIS — Z79899 Other long term (current) drug therapy: Secondary | ICD-10-CM | POA: Insufficient documentation

## 2019-12-05 DIAGNOSIS — G620 Drug-induced polyneuropathy: Secondary | ICD-10-CM | POA: Insufficient documentation

## 2019-12-05 DIAGNOSIS — T451X5A Adverse effect of antineoplastic and immunosuppressive drugs, initial encounter: Secondary | ICD-10-CM | POA: Insufficient documentation

## 2019-12-05 DIAGNOSIS — Z9221 Personal history of antineoplastic chemotherapy: Secondary | ICD-10-CM | POA: Diagnosis not present

## 2019-12-05 DIAGNOSIS — Z171 Estrogen receptor negative status [ER-]: Secondary | ICD-10-CM | POA: Diagnosis not present

## 2019-12-05 DIAGNOSIS — C50412 Malignant neoplasm of upper-outer quadrant of left female breast: Secondary | ICD-10-CM | POA: Diagnosis not present

## 2019-12-05 NOTE — Assessment & Plan Note (Signed)
11/08/2018: Routine screening mammogram detected 2 masses in the left breast, measuring 1.6cm and 1.1cm, spanning a total of 3cm. Biopsy confirmed IDC with DCIS, HER-2 negative by FISH (2+ by IHC), ER/PR negative, Ki67 80%.  Staging: T2N0 stage IIb clinical stage Breast MRI: 11/18/2018: 2.9 cm bilobed mass UOQ left breast  Treatment plan 1.Neoadjuvant chemotherapy with dose dense Adriamycin and Cytoxan x4 followed by Taxol and carboplatincompleted 01/25/2019 2.05/28/18:Left lumpectomy Carol Byrd): scattered microscopic foci of high grade DCIS with no invasive carcinoma, clear margins, 2 left axillary lymph nodes negative. 2.Followed by adjuvant radiation 05/03/2019-05/24/2019 -------------------------------------------------------------------------------------------------- Treatment plan: surveillance Breast cancer surveillance: 1.  Mammogram 10/31/2019: Benign no evidence of malignancy expected postlumpectomy changes.  Density C Breast exam: 12/05/2019: Benign  Chemo-induced peripheral neuropathy: Mild  Return to clinic in 1 year for follow-up

## 2019-12-06 ENCOUNTER — Telehealth: Payer: Self-pay | Admitting: Hematology and Oncology

## 2019-12-06 NOTE — Telephone Encounter (Signed)
Scheduled per 7/19 los. Called and left a msg, mailing appt letter and calendar printout

## 2020-01-01 ENCOUNTER — Encounter: Payer: Self-pay | Admitting: Hematology and Oncology

## 2020-02-14 DIAGNOSIS — H9313 Tinnitus, bilateral: Secondary | ICD-10-CM | POA: Diagnosis not present

## 2020-02-14 DIAGNOSIS — H903 Sensorineural hearing loss, bilateral: Secondary | ICD-10-CM | POA: Insufficient documentation

## 2020-02-29 ENCOUNTER — Ambulatory Visit: Payer: Medicare Other | Admitting: Hematology and Oncology

## 2020-04-16 DIAGNOSIS — I10 Essential (primary) hypertension: Secondary | ICD-10-CM | POA: Diagnosis not present

## 2020-04-16 DIAGNOSIS — C50919 Malignant neoplasm of unspecified site of unspecified female breast: Secondary | ICD-10-CM | POA: Diagnosis not present

## 2020-04-16 DIAGNOSIS — E559 Vitamin D deficiency, unspecified: Secondary | ICD-10-CM | POA: Diagnosis not present

## 2020-04-16 DIAGNOSIS — Z79899 Other long term (current) drug therapy: Secondary | ICD-10-CM | POA: Diagnosis not present

## 2020-04-16 DIAGNOSIS — N181 Chronic kidney disease, stage 1: Secondary | ICD-10-CM | POA: Diagnosis not present

## 2020-04-16 DIAGNOSIS — K219 Gastro-esophageal reflux disease without esophagitis: Secondary | ICD-10-CM | POA: Diagnosis not present

## 2020-04-16 DIAGNOSIS — E039 Hypothyroidism, unspecified: Secondary | ICD-10-CM | POA: Diagnosis not present

## 2020-04-16 DIAGNOSIS — R413 Other amnesia: Secondary | ICD-10-CM | POA: Diagnosis not present

## 2020-04-16 DIAGNOSIS — E785 Hyperlipidemia, unspecified: Secondary | ICD-10-CM | POA: Diagnosis not present

## 2020-04-23 DIAGNOSIS — Z853 Personal history of malignant neoplasm of breast: Secondary | ICD-10-CM | POA: Diagnosis not present

## 2020-04-23 DIAGNOSIS — Z0000 Encounter for general adult medical examination without abnormal findings: Secondary | ICD-10-CM | POA: Diagnosis not present

## 2020-04-23 DIAGNOSIS — E785 Hyperlipidemia, unspecified: Secondary | ICD-10-CM | POA: Diagnosis not present

## 2020-04-23 DIAGNOSIS — I10 Essential (primary) hypertension: Secondary | ICD-10-CM | POA: Diagnosis not present

## 2020-04-23 DIAGNOSIS — E039 Hypothyroidism, unspecified: Secondary | ICD-10-CM | POA: Diagnosis not present

## 2020-06-05 NOTE — Progress Notes (Incomplete)
Patient Care Team: [name], MD as PCP - General (Internal Medicine) Satira Sark, MD as PCP - Cardiology (Cardiology) Gery Pray, MD as Consulting Physician (Radiation Oncology) Nicholas Lose, MD as Consulting Physician (Hematology and Oncology) Rolm Bookbinder, MD as Consulting Physician (General Surgery)  DIAGNOSIS:    ICD-10-CM   1. Malignant neoplasm of upper-outer quadrant of left breast in female, estrogen receptor negative ([place])  C50.412    Z17.1     SUMMARY OF ONCOLOGIC HISTORY: Oncology History  Malignant neoplasm of upper-outer quadrant of left breast in female, estrogen receptor negative ([place])  11/08/2018 Initial Diagnosis   Routine screening mammogram detected 2 masses in the left breast, measuring 1.6cm and 1.1cm, spanning a total of 3cm. Biopsy confirmed IDC with DCIS, HER-2 negative by FISH (2+ by IHC), ER/PR negative, Ki67 80%.    11/18/2018 Breast MRI   2.9 centimeter bilobed mass in the UOQ of the left breast, consistent with known malignancy. 0.5 centimeter satellite nodule is 0.5 centimeters below this mass.   11/22/2018 Cancer Staging   Staging form: Breast, AJCC 8th Edition - Clinical stage from 11/22/2018: Stage IIB (cT2, cN0, cM0, G3, ER-, PR-, HER2-)    11/27/2018 Genetic Testing   Patient had genetic testing done for a personal and family history of breast cancer.  Results were negative on the [facility] Breast cancer STAT gene panel. The STAT Breast cancer panel offered by [facility] includes sequencing and rearrangement analysis for the following 7 genes:  BRCA1, BRCA2, CDH1, PALB2, PTEN, STK11 and TP53.  The report date is 11/27/2018.   12/01/2018 - 01/25/2019 Chemotherapy   DOXOrubicin (ADRIAMYCIN) chemo injection 114 mg, 60 mg/m2 = 114 mg, Intravenous,  Once, 4 of 4 cycles. Dose modification: 50 mg/m2 (original dose 60 mg/m2, Cycle 2, Reason: Dose not tolerated), 40 mg/m2 (original dose 60 mg/m2, Cycle 4, Reason: Dose not tolerated). Administration:  114 mg (12/01/2018), 96 mg (12/15/2018), 96 mg (12/29/2018), 76 mg (01/12/2019)  palonosetron (ALOXI) injection 0.25 mg, 0.25 mg, Intravenous,  Once, 5 of 8 cycles. Administration: 0.25 mg (12/01/2018), 0.25 mg (01/25/2019), 0.25 mg (12/15/2018), 0.25 mg (12/29/2018), 0.25 mg (01/12/2019)  pegfilgrastim-jmdb (FULPHILA) injection 6 mg, 6 mg, Subcutaneous,  Once, 4 of 4 cycles. Administration: 6 mg (12/03/2018), 6 mg (12/17/2018), 6 mg (12/31/2018), 6 mg (01/14/2019)  CARBOplatin (PARAPLATIN) 510 mg in sodium chloride 0.9 % 250 mL chemo infusion, 510 mg (106.9 % of original dose 478.8 mg), Intravenous,  Once, 1 of 4 cycles. Dose modification: (original dose 478.8 mg, Cycle 5). Administration: 510 mg (01/25/2019)  cyclophosphamide (CYTOXAN) 1,140 mg in sodium chloride 0.9 % 250 mL chemo infusion, 600 mg/m2 = 1,140 mg, Intravenous,  Once, 4 of 4 cycles. Dose modification: 500 mg/m2 (original dose 600 mg/m2, Cycle 2, Reason: Dose not tolerated), 400 mg/m2 (original dose 600 mg/m2, Cycle 4, Reason: Dose not tolerated). Administration: 1,140 mg (12/01/2018), 960 mg (12/15/2018), 960 mg (12/29/2018), 760 mg (01/12/2019)  PACLitaxel (TAXOL) 150 mg in sodium chloride 0.9 % 250 mL chemo infusion (</= 9m/m2), 80 mg/m2 = 150 mg, Intravenous,  Once, 1 of 4 cycles Administration: 150 mg (01/25/2019)  fosaprepitant (EMEND) 150 mg   dexamethasone (DECADRON) 12 mg in sodium chloride 0.9 % 145 mL IVPB, , Intravenous,  Once, 5 of 8 cycles Administration:  (12/01/2018),  (01/25/2019),  (12/15/2018),  (12/29/2018),  (01/12/2019)   03/29/2019 Surgery   Left lumpectomy (Carol Byrd ([phone]: scattered microscopic foci of high grade DCIS with no invasive carcinoma, clear margins, 2 left axillary lymph nodes negative.  03/29/2019 Cancer Staging   Staging form: Breast, AJCC 8th Edition - Pathologic stage from 03/29/2019: No Stage Recommended (ypTis (DCIS), pN0, cM0)    05/03/2019 - 05/31/2019 Radiation Therapy   The patient initially  received a dose of 40.05 Gy in 15 fractions to the breast using whole-breast tangent fields. This was delivered using a 3-D conformal technique. The pt received a boost delivering an additional 10 Gy in 5 fractions using a electron boost with 46mV electrons. The total dose was 50.05 Gy.      CHIEF COMPLIANT: Follow-up of triple negative left breast cancer  INTERVAL HISTORY: Carol Miyazakiis a 75y.o. with above-mentioned history of triple negativeleft breast cancertreated withneoadjuvant chemotherapy, left lumpectomy, radiation, and is currently on surveillance. She presents to the clinic todayfor follow-up   ALLERGIES:  is allergic to alpha-gal.  MEDICATIONS:  Current Outpatient Medications  Medication Sig Dispense Refill  . atorvastatin (LIPITOR) 20 MG tablet Take 20 mg by mouth See admin instructions. Takes in the evening on Tuesdays only    . Biotin 5000 MCG TABS Take 5,000 mcg by mouth daily. (Patient not taking: Reported on 11/03/2019) 30 tablet   . cholecalciferol (VITAMIN D3) 25 MCG (1000 UT) tablet Take 2,000 Units by mouth daily.    .Marland KitchenEPINEPHrine 0.3 mg/0.3 mL IJ SOAJ injection Inject into the muscle.    . ezetimibe (ZETIA) 10 MG tablet Take 10 mg by mouth at bedtime.    .Marland Kitchenlevothyroxine (SYNTHROID) 75 MCG tablet Take 75 mcg by mouth daily.    . Methylcellulose, Laxative, (CITRUCEL) 500 MG TABS Take 500 mg by mouth daily.    . pantoprazole (PROTONIX) 40 MG tablet Take 40 mg by mouth 2 (two) times daily before a meal.    . Polyethyl Glycol-Propyl Glycol (SYSTANE) 0.4-0.3 % SOLN Place 1 drop into both eyes at bedtime as needed (dry eyes).    .Marland Kitchentelmisartan (MICARDIS) 40 MG tablet Take 40 mg by mouth daily.     No current facility-administered medications for this visit.    PHYSICAL EXAMINATION: ECOG PERFORMANCE STATUS: {CHL ONC ECOG PS:[phone]}  There were no vitals filed for this visit. There were no vitals filed for this visit.  BREAST:*** No palpable  masses or nodules in either right or left breasts. No palpable axillary supraclavicular or infraclavicular adenopathy no breast tenderness or nipple discharge. (exam performed in the presence of a chaperone)  LABORATORY DATA:  I have reviewed the data as listed CMP Latest Ref Rng & Units 03/14/2019 02/23/2019 02/16/2019  Glucose 70 - 99 mg/dL 85 116(H) 102(H)  BUN 8 - 23 mg/dL 27(H) 19 23  Creatinine 0.44 - 1.00 mg/dL 1.19(H) 0.92 0.89  Sodium 135 - 145 mmol/L 142 141 140  Potassium 3.5 - 5.1 mmol/L 4.4 3.9 4.3  Chloride 98 - 111 mmol/L 105 107 106  CO2 22 - 32 mmol/L 24 24 24   Calcium 8.9 - 10.3 mg/dL 9.2 8.7(L) 8.8(L)  Total Protein 6.5 - 8.1 g/dL 7.0 6.7 6.4(L)  Total Bilirubin 0.3 - 1.2 mg/dL 0.5 0.6 0.6  Alkaline Phos 38 - 126 U/L 57 56 54  AST 15 - 41 U/L 16 16 15   ALT 0 - 44 U/L 14 13 9     Lab Results  Component Value Date   WBC 4.1 03/14/2019   HGB 9.9 (L) 03/14/2019   HCT 29.4 (L) 03/14/2019   MCV 106.9 (H) 03/14/2019   PLT 192 03/14/2019   NEUTROABS 2.4 03/14/2019    ASSESSMENT & PLAN:  No problem-specific Assessment & Plan notes found for this encounter.    No orders of the defined types were placed in this encounter.  The patient has a good understanding of the overall plan. she agrees with it. she will call with any problems that may develop before the next visit here.  Total time spent: *** mins including face to face time and time spent for planning, charting and coordination of care  Nicholas Lose, MD 06/05/2020  I, Cloyde Reams Dorshimer, am acting as scribe for Dr. Nicholas Lose.  {insert scribe attestation}

## 2020-06-05 NOTE — Assessment & Plan Note (Deleted)
11/08/2018: Routine screening mammogram detected 2 masses in the left breast, measuring 1.6cm and 1.1cm, spanning a total of 3cm. Biopsy confirmed IDC with DCIS, HER-2 negative by FISH (2+ by IHC), ER/PR negative, Ki67 80%.  Staging: T2N0 stage IIb clinical stage Breast MRI: 11/18/2018: 2.9 cm bilobed mass UOQ left breast  Treatment plan 1.Neoadjuvant chemotherapy with dose dense Adriamycin and Cytoxan x4 followed by Taxol and carboplatincompleted 01/25/2019 2.05/28/18:Left lumpectomy Carol Byrd): scattered microscopic foci of high grade DCIS with no invasive carcinoma, clear margins, 2 left axillary lymph nodes negative. 2.Followed by adjuvant radiation12/15/2020-05/24/2019 -------------------------------------------------------------------------------------------------- Treatment plan: surveillance Breast cancer surveillance: 1.  Mammogram 10/31/2019: Benign no evidence of malignancy expected postlumpectomy changes.  Density C 2. Breast exam: 12/05/2019: Benign  Chemo-induced peripheral neuropathy: Mild  Return to clinic in 1 year for follow-up

## 2020-06-06 ENCOUNTER — Encounter: Payer: Self-pay | Admitting: Hematology and Oncology

## 2020-06-06 ENCOUNTER — Inpatient Hospital Stay: Payer: Medicare Other | Admitting: Hematology and Oncology

## 2020-06-06 DIAGNOSIS — C50412 Malignant neoplasm of upper-outer quadrant of left female breast: Secondary | ICD-10-CM

## 2020-06-06 DIAGNOSIS — Z20822 Contact with and (suspected) exposure to covid-19: Secondary | ICD-10-CM | POA: Diagnosis not present

## 2020-06-19 NOTE — Assessment & Plan Note (Addendum)
11/08/2018: Routine screening mammogram detected 2 masses in the left breast, measuring 1.6cm and 1.1cm, spanning a total of 3cm. Biopsy confirmed IDC with DCIS, HER-2 negative by FISH (2+ by IHC), ER/PR negative, Ki67 80%.  Staging: T2N0 stage IIb clinical stage Breast MRI: 11/18/2018: 2.9 cm bilobed mass UOQ left breast  Treatment plan 1.Neoadjuvant chemotherapy with dose dense Adriamycin and Cytoxan x4 followed by Taxol and carboplatincompleted 01/25/2019 2.05/28/18:Left lumpectomy Carol Byrd): scattered microscopic foci of high grade DCIS with no invasive carcinoma, clear margins, 2 left axillary lymph nodes negative. 2.Followed by adjuvant radiation12/15/2020-05/24/2019 -------------------------------------------------------------------------------------------------- Treatment plan: surveillance Breast cancer surveillance: 1.  Mammogram 10/31/2019: Benign no evidence of malignancy expected postlumpectomy changes.  Density C 2. Breast exam: 06/25/2020: Benign  Chemo-induced peripheral neuropathy: Mild Return to clinic in 1 year for follow-up

## 2020-06-24 NOTE — Progress Notes (Signed)
Patient Care Team: [name], MD as PCP - General (Internal Medicine) Satira Sark, MD as PCP - Cardiology (Cardiology) Gery Pray, MD as Consulting Physician (Radiation Oncology) Nicholas Lose, MD as Consulting Physician (Hematology and Oncology) Rolm Bookbinder, MD as Consulting Physician (General Surgery)  DIAGNOSIS:    ICD-10-CM   1. Malignant neoplasm of upper-outer quadrant of left breast in female, estrogen receptor negative ([place])  C50.412    Z17.1     SUMMARY OF ONCOLOGIC HISTORY: Oncology History  Malignant neoplasm of upper-outer quadrant of left breast in female, estrogen receptor negative (Carol Byrd)  11/08/2018 Initial Diagnosis   Routine screening mammogram detected 2 masses in the left breast, measuring 1.6cm and 1.1cm, spanning a total of 3cm. Biopsy confirmed IDC with DCIS, HER-2 negative by FISH (2+ by IHC), ER/PR negative, Ki67 80%.    11/18/2018 Breast MRI   2.9 centimeter bilobed mass in the UOQ of the left breast, consistent with known malignancy. 0.5 centimeter satellite nodule is 0.5 centimeters below this mass.   11/22/2018 Cancer Staging   Staging form: Breast, AJCC 8th Edition - Clinical stage from 11/22/2018: Stage IIB (cT2, cN0, cM0, G3, ER-, PR-, HER2-)    11/27/2018 Genetic Testing   Patient had genetic testing done for a personal and family history of breast cancer.  Results were negative on the [facility] Breast cancer STAT gene panel. The STAT Breast cancer panel offered by [facility] includes sequencing and rearrangement analysis for the following 7 genes:  BRCA1, BRCA2, CDH1, PALB2, PTEN, STK11 and TP53.  The report date is 11/27/2018.   12/01/2018 - 01/25/2019 Chemotherapy   DOXOrubicin (ADRIAMYCIN) chemo injection 114 mg, 60 mg/m2 = 114 mg, Intravenous,  Once, 4 of 4 cycles. Dose modification: 50 mg/m2 (original dose 60 mg/m2, Cycle 2, Reason: Dose not tolerated), 40 mg/m2 (original dose 60 mg/m2, Cycle 4, Reason: Dose not tolerated). Administration:  114 mg (12/01/2018), 96 mg (12/15/2018), 96 mg (12/29/2018), 76 mg (01/12/2019)  palonosetron (ALOXI) injection 0.25 mg, 0.25 mg, Intravenous,  Once, 5 of 8 cycles. Administration: 0.25 mg (12/01/2018), 0.25 mg (01/25/2019), 0.25 mg (12/15/2018), 0.25 mg (12/29/2018), 0.25 mg (01/12/2019)  pegfilgrastim-jmdb (FULPHILA) injection 6 mg, 6 mg, Subcutaneous,  Once, 4 of 4 cycles. Administration: 6 mg (12/03/2018), 6 mg (12/17/2018), 6 mg (12/31/2018), 6 mg (01/14/2019)  CARBOplatin (PARAPLATIN) 510 mg in sodium chloride 0.9 % 250 mL chemo infusion, 510 mg (106.9 % of original dose 478.8 mg), Intravenous,  Once, 1 of 4 cycles. Dose modification: (original dose 478.8 mg, Cycle 5). Administration: 510 mg (01/25/2019)  cyclophosphamide (CYTOXAN) 1,140 mg in sodium chloride 0.9 % 250 mL chemo infusion, 600 mg/m2 = 1,140 mg, Intravenous,  Once, 4 of 4 cycles. Dose modification: 500 mg/m2 (original dose 600 mg/m2, Cycle 2, Reason: Dose not tolerated), 400 mg/m2 (original dose 600 mg/m2, Cycle 4, Reason: Dose not tolerated). Administration: 1,140 mg (12/01/2018), 960 mg (12/15/2018), 960 mg (12/29/2018), 760 mg (01/12/2019)  PACLitaxel (TAXOL) 150 mg in sodium chloride 0.9 % 250 mL chemo infusion (</= 38m/m2), 80 mg/m2 = 150 mg, Intravenous,  Once, 1 of 4 cycles Administration: 150 mg (01/25/2019)  fosaprepitant (EMEND) 150 mg   dexamethasone (DECADRON) 12 mg in sodium chloride 0.9 % 145 mL IVPB, , Intravenous,  Once, 5 of 8 cycles Administration:  (12/01/2018),  (01/25/2019),  (12/15/2018),  (12/29/2018),  (01/12/2019)   03/29/2019 Surgery   Left lumpectomy (Carol Byrd ([phone]: scattered microscopic foci of high grade DCIS with no invasive carcinoma, clear margins, 2 left axillary lymph nodes negative.  03/29/2019 Cancer Staging   Staging form: Breast, AJCC 8th Edition - Pathologic stage from 03/29/2019: No Stage Recommended (ypTis (DCIS), pN0, cM0)    05/03/2019 - 05/31/2019 Radiation Therapy   The patient initially  received a dose of 40.05 Gy in 15 fractions to the breast using whole-breast tangent fields. This was delivered using a 3-D conformal technique. The pt received a boost delivering an additional 10 Gy in 5 fractions using a electron boost with 45mV electrons. The total dose was 50.05 Gy.      CHIEF COMPLIANT: Follow-up of triple negative left breast cancer  INTERVAL HISTORY: Carol Byrd a 75y.o. with above-mentioned history of triple negativeleft breast cancertreated withneoadjuvant chemotherapy, left lumpectomy, radiation, and is currently on surveillance. She presents to the clinic todayfor follow-up   ALLERGIES:  is allergic to alpha-gal.  MEDICATIONS:  Current Outpatient Medications  Medication Sig Dispense Refill   atorvastatin (LIPITOR) 20 MG tablet Take 20 mg by mouth See admin instructions. Takes in the evening on Tuesdays only     Biotin 5000 MCG TABS Take 5,000 mcg by mouth daily. (Patient not taking: Reported on 11/03/2019) 30 tablet    cholecalciferol (VITAMIN D3) 25 MCG (1000 UT) tablet Take 2,000 Units by mouth daily.     EPINEPHrine 0.3 mg/0.3 mL IJ SOAJ injection Inject into the muscle.     ezetimibe (ZETIA) 10 MG tablet Take 10 mg by mouth at bedtime.     levothyroxine (SYNTHROID) 75 MCG tablet Take 75 mcg by mouth daily.     Methylcellulose, Laxative, (CITRUCEL) 500 MG TABS Take 500 mg by mouth daily.     pantoprazole (PROTONIX) 40 MG tablet Take 40 mg by mouth 2 (two) times daily before a meal.     Polyethyl Glycol-Propyl Glycol (SYSTANE) 0.4-0.3 % SOLN Place 1 drop into both eyes at bedtime as needed (dry eyes).     telmisartan (MICARDIS) 40 MG tablet Take 40 mg by mouth daily.     No current facility-administered medications for this visit.    PHYSICAL EXAMINATION: ECOG PERFORMANCE STATUS: 1 - Symptomatic but completely ambulatory  There were no vitals filed for this visit. There were no vitals filed for this visit.  BREAST: No  palpable masses or nodules in either right or left breasts. No palpable axillary supraclavicular or infraclavicular adenopathy no breast tenderness or nipple discharge. (exam performed in the presence of a chaperone)  LABORATORY DATA:  I have reviewed the data as listed CMP Latest Ref Rng & Units 03/14/2019 02/23/2019 02/16/2019  Glucose 70 - 99 mg/dL 85 116(H) 102(H)  BUN 8 - 23 mg/dL 27(H) 19 23  Creatinine 0.44 - 1.00 mg/dL 1.19(H) 0.92 0.89  Sodium 135 - 145 mmol/L 142 141 140  Potassium 3.5 - 5.1 mmol/L 4.4 3.9 4.3  Chloride 98 - 111 mmol/L 105 107 106  CO2 22 - 32 mmol/L 24 24 24   Calcium 8.9 - 10.3 mg/dL 9.2 8.7(L) 8.8(L)  Total Protein 6.5 - 8.1 g/dL 7.0 6.7 6.4(L)  Total Bilirubin 0.3 - 1.2 mg/dL 0.5 0.6 0.6  Alkaline Phos 38 - 126 U/L 57 56 54  AST 15 - 41 U/L 16 16 15   ALT 0 - 44 U/L 14 13 9     Lab Results  Component Value Date   WBC 4.1 03/14/2019   HGB 9.9 (L) 03/14/2019   HCT 29.4 (L) 03/14/2019   MCV 106.9 (H) 03/14/2019   PLT 192 03/14/2019   NEUTROABS 2.4 03/14/2019    ASSESSMENT &  PLAN:  Malignant neoplasm of upper-outer quadrant of left breast in female, estrogen receptor negative ([place]) 11/08/2018: Routine screening mammogram detected 2 masses in the left breast, measuring 1.6cm and 1.1cm, spanning a total of 3cm. Biopsy confirmed IDC with DCIS, HER-2 negative by FISH (2+ by IHC), ER/PR negative, Ki67 80%.  Staging: T2N0 stage IIb clinical stage Breast MRI: 11/18/2018: 2.9 cm bilobed mass UOQ left breast  Treatment plan 1.Neoadjuvant chemotherapy with dose dense Adriamycin and Cytoxan x4 followed by Taxol and carboplatincompleted 01/25/2019 2.05/28/18:Left lumpectomy Carol Byrd): scattered microscopic foci of high grade DCIS with no invasive carcinoma, clear margins, 2 left axillary lymph nodes negative. 2.Followed by adjuvant  radiation12/15/2020-05/24/2019 -------------------------------------------------------------------------------------------------- Treatment plan: surveillance Breast cancer surveillance: 1.  Mammogram 10/31/2019: Benign no evidence of malignancy expected postlumpectomy changes.  Density C 2. Breast exam: 06/25/2020: Benign  Chemo-induced peripheral neuropathy: Resolved Abdominal pain and flank pain: We will obtain CT chest abdomen pelvis. We will need to obtain lab results from Dr. Ria Comment office regarding her kidney function. Telephone visit after the scans to discuss results.  Return to clinic in 1 year for follow-up    No orders of the defined types were placed in this encounter.  The patient has a good understanding of the overall plan. she agrees with it. she will call with any problems that may develop before the next visit here.  Total time spent: 20 mins including face to face time and time spent for planning, charting and coordination of care  Rulon Eisenmenger, MD, MPH 06/25/2020  I, Cloyde Reams Dorshimer, am acting as scribe for Dr. Nicholas Lose.  I have reviewed the above documentation for accuracy and completeness, and I agree with the above.

## 2020-06-25 ENCOUNTER — Inpatient Hospital Stay: Payer: Medicare Other | Attending: Hematology and Oncology | Admitting: Hematology and Oncology

## 2020-06-25 ENCOUNTER — Other Ambulatory Visit: Payer: Self-pay

## 2020-06-25 ENCOUNTER — Other Ambulatory Visit: Payer: Self-pay | Admitting: Hematology and Oncology

## 2020-06-25 DIAGNOSIS — Z923 Personal history of irradiation: Secondary | ICD-10-CM | POA: Insufficient documentation

## 2020-06-25 DIAGNOSIS — Z171 Estrogen receptor negative status [ER-]: Secondary | ICD-10-CM | POA: Insufficient documentation

## 2020-06-25 DIAGNOSIS — C50412 Malignant neoplasm of upper-outer quadrant of left female breast: Secondary | ICD-10-CM | POA: Insufficient documentation

## 2020-06-25 DIAGNOSIS — R109 Unspecified abdominal pain: Secondary | ICD-10-CM | POA: Insufficient documentation

## 2020-06-26 ENCOUNTER — Telehealth: Payer: Self-pay | Admitting: Hematology and Oncology

## 2020-06-26 DIAGNOSIS — C50912 Malignant neoplasm of unspecified site of left female breast: Secondary | ICD-10-CM | POA: Diagnosis not present

## 2020-06-26 DIAGNOSIS — E039 Hypothyroidism, unspecified: Secondary | ICD-10-CM | POA: Diagnosis not present

## 2020-06-26 DIAGNOSIS — E785 Hyperlipidemia, unspecified: Secondary | ICD-10-CM | POA: Diagnosis not present

## 2020-06-26 DIAGNOSIS — I10 Essential (primary) hypertension: Secondary | ICD-10-CM | POA: Diagnosis not present

## 2020-06-26 DIAGNOSIS — Z79899 Other long term (current) drug therapy: Secondary | ICD-10-CM | POA: Diagnosis not present

## 2020-06-26 NOTE — Telephone Encounter (Signed)
Scheduled appts per 2/7 los. Pt confirmed appt date, time, and that it's a phone visit

## 2020-06-27 ENCOUNTER — Encounter: Payer: Self-pay | Admitting: Internal Medicine

## 2020-07-02 IMAGING — MG MM DIGITAL DIAGNOSTIC UNILAT*L* W/ TOMO W/ CAD
6 series · 6 of 18 positions shown · non-contrast
Comparison: 03/29/2019

CLINICAL DATA: Patient has redness and tenderness at the surgical
site since lumpectomy was performed in March 2019. On 06/27/2019
the patient is seen at [REDACTED] and 15 ml of cloudy
fluid was aspirated from the lumpectomy site. Patient had the
incision packed for several days. Patient completed a course of
Bactrim without significant improvement, and was started on
doxycycline yesterday.

EXAM:
DIGITAL DIAGNOSTIC LEFT MAMMOGRAM WITH CAD AND TOMO
ULTRASOUND LEFT BREAST

[L CC synth-2D]
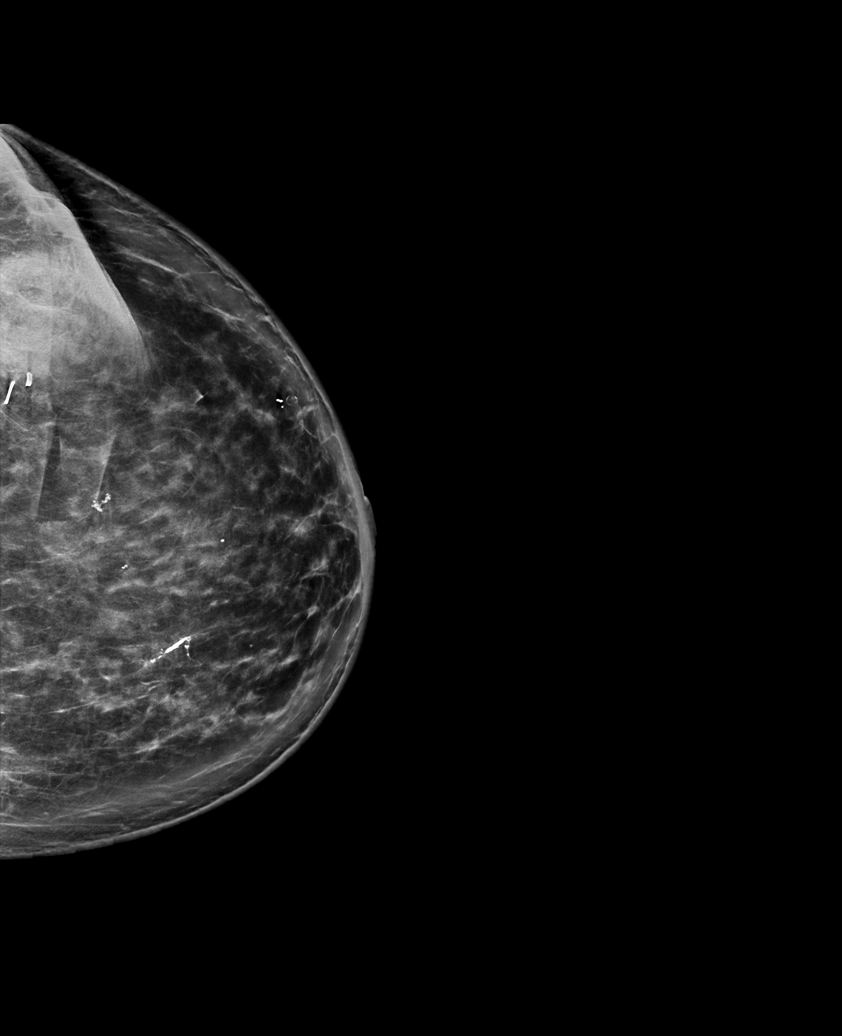

[L MLO synth-2D]
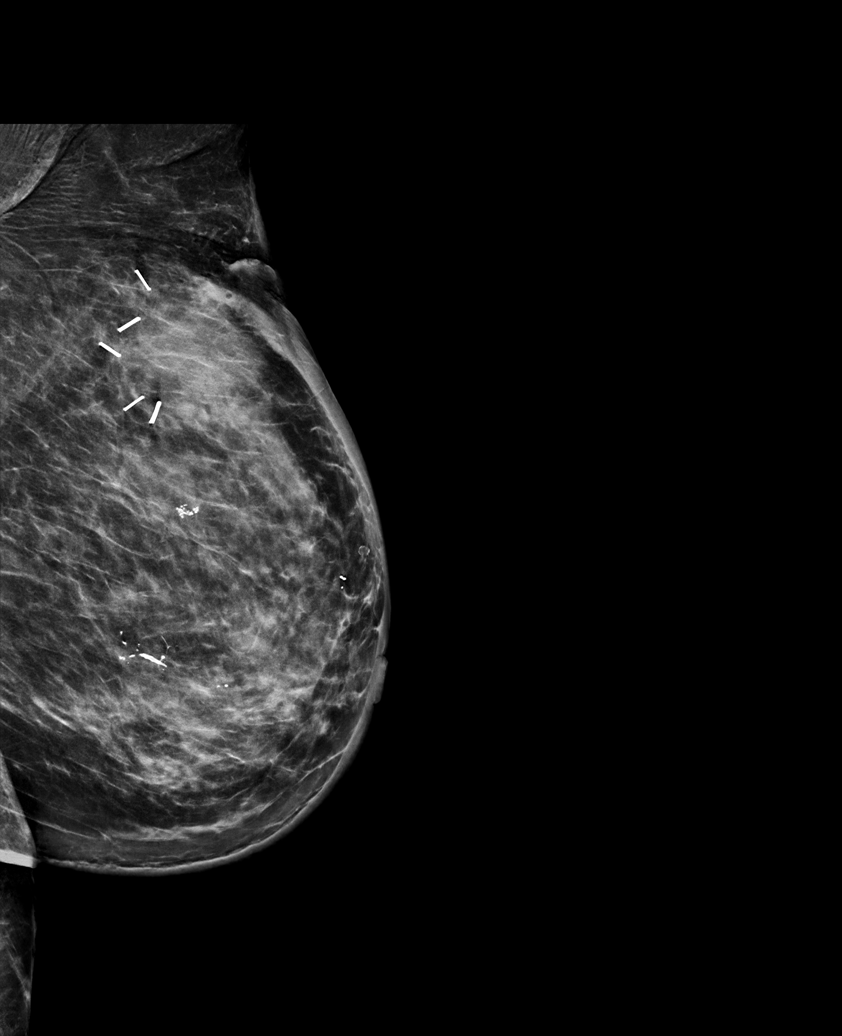

[L XCCL synth-2D]
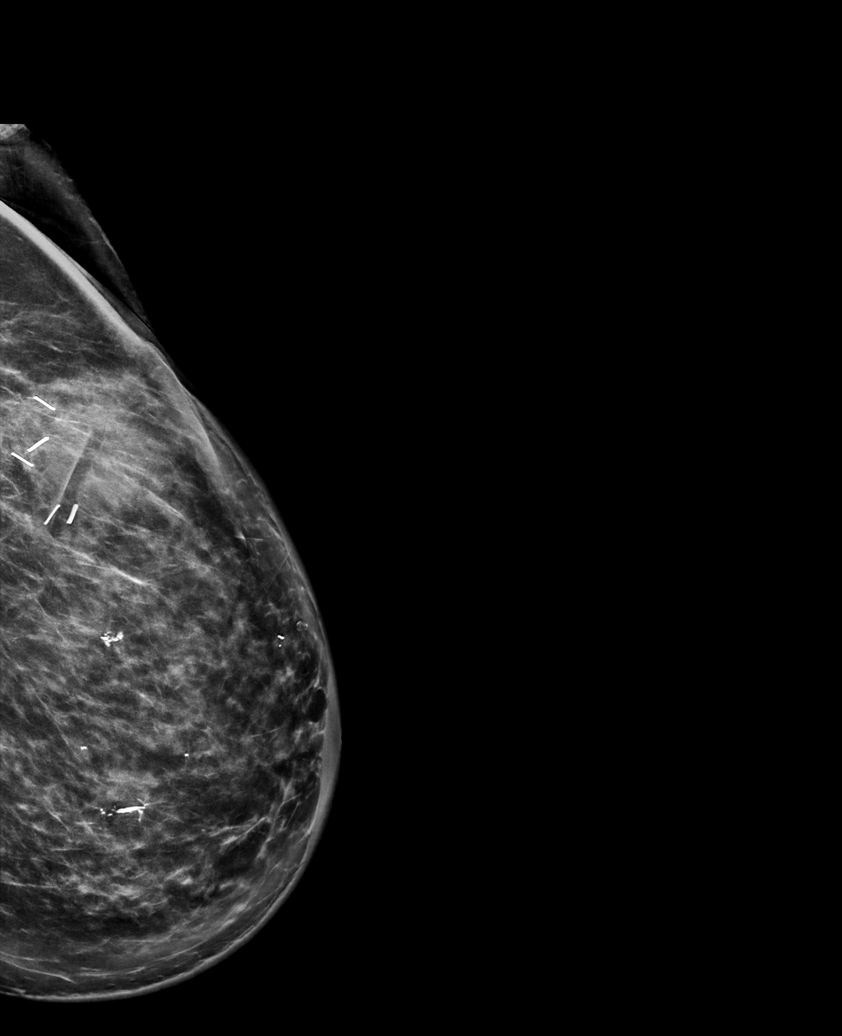

[L MLO tomo · tomo slice 51/100.0]
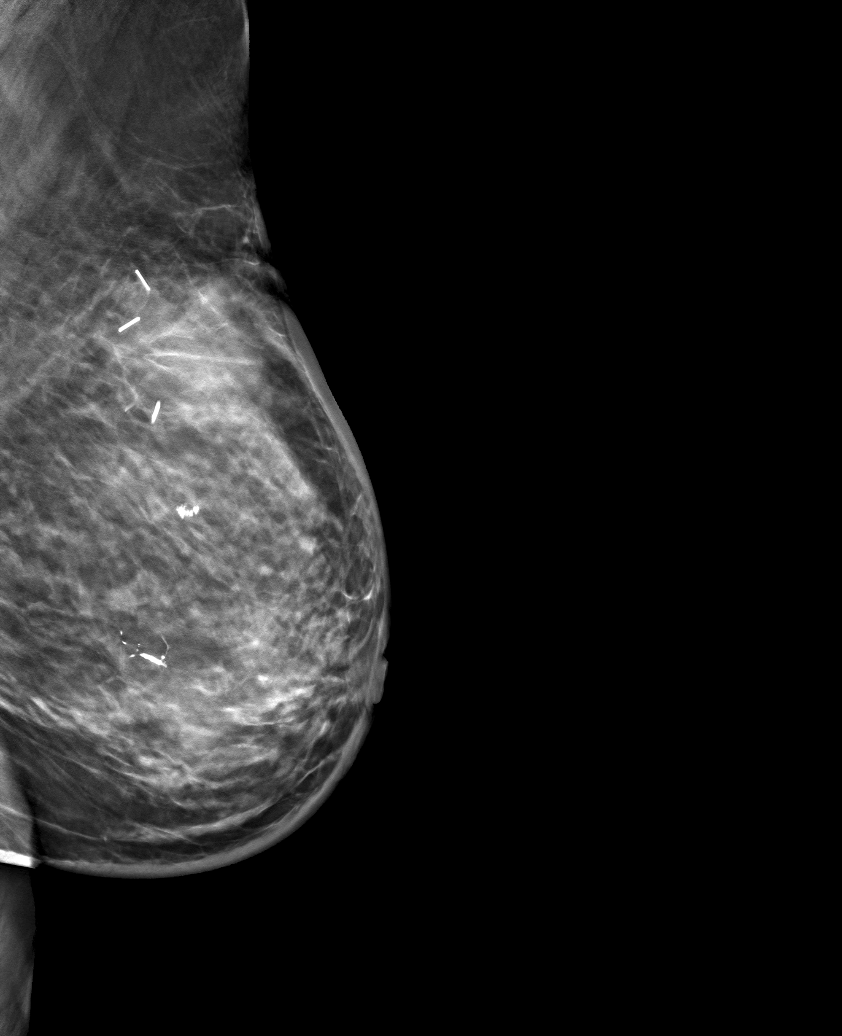

[L XCCL tomo · tomo slice 53/105.0]
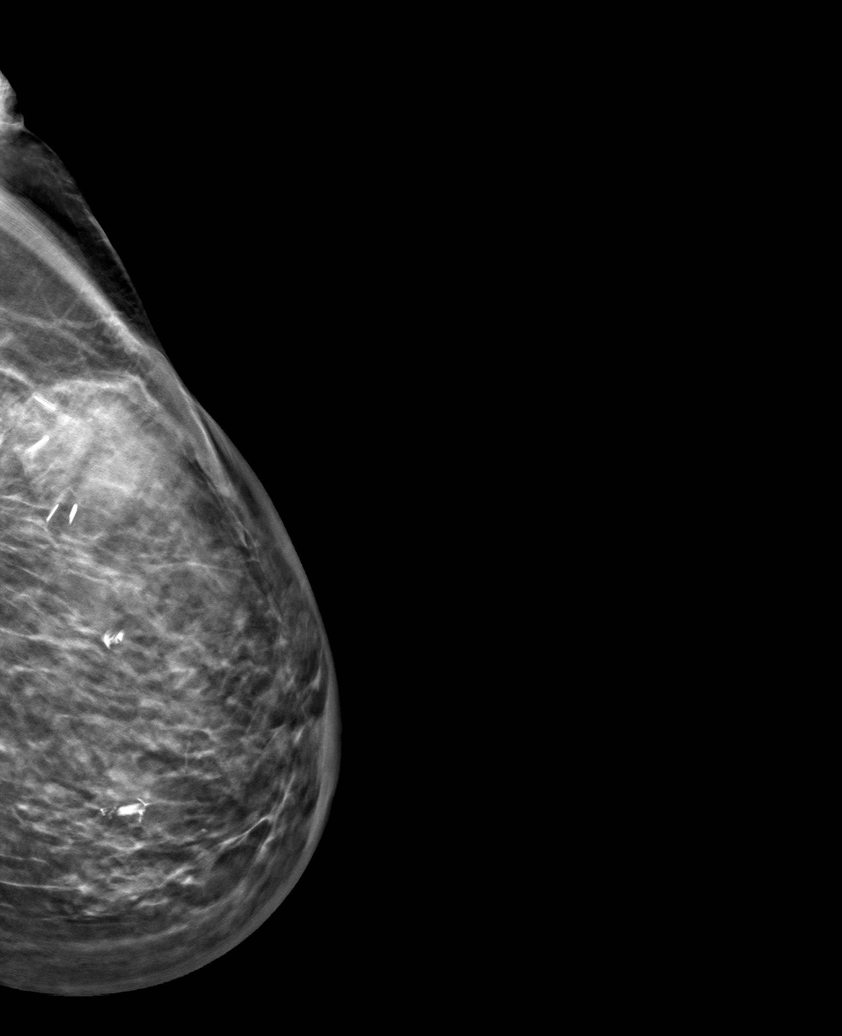

[L CC tomo · tomo slice 55/108.0]
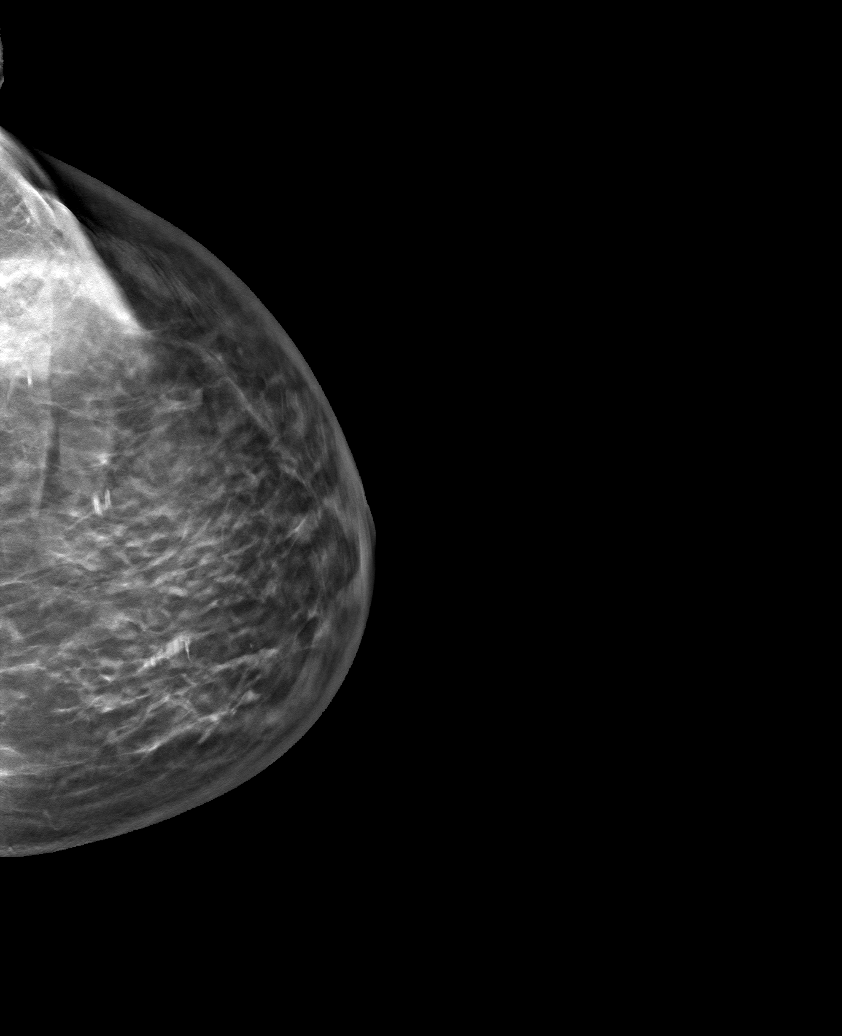

[6 of 18 positions shown; findings below may reference images not displayed]

ACR Breast Density Category c: The breast tissue is heterogeneously
dense, which may obscure small masses.
FINDINGS: There is indistinct mass in the UPPER-OUTER QUADRANT of the LEFT
breast, associated with surgical clips and consistent with
lumpectomy scar. Skin of the LEFT breast is thickened compared to
prior studies.

Mammographic images were processed with CAD.

On physical exam, there is a 20 centimeter area of erythema in the
UPPER-OUTER QUADRANT of the LEFT breast, most focally inflamed
centrally, where I palpate a mass in the 2 o'clock location. There
is no drainage or skin breakdown.

Targeted ultrasound is performed, showing an irregular collection in
the 2 o'clock location of the LEFT breast 9 centimeters from the
nipple measuring 2.8 x 2.6 x 2.4 centimeters. The collection
contains numerous internal septations.
IMPRESSION: Fluid collection in the UPPER-OUTER QUADRANT of the LEFT breast
consistent with seroma cavity or abscess.

RECOMMENDATION:
Recommend ultrasound-guided aspiration of fluid collection in the
UPPER-OUTER QUADRANT 2 sent for microbiology and for symptomatic
relief.

Following aspiration, recommend LEFT breast ultrasound in 1 week.

I have discussed the findings and recommendations with the patient.
If applicable, a reminder letter will be sent to the patient
regarding the next appointment.

BI-RADS CATEGORY  2: Benign.

## 2020-07-03 ENCOUNTER — Ambulatory Visit (HOSPITAL_COMMUNITY)
Admission: RE | Admit: 2020-07-03 | Discharge: 2020-07-03 | Disposition: A | Discharge status: Home / Self Care | Payer: Medicare Other | Source: Ambulatory Visit | Attending: Hematology and Oncology | Admitting: Hematology and Oncology

## 2020-07-03 ENCOUNTER — Other Ambulatory Visit: Payer: Self-pay

## 2020-07-03 DIAGNOSIS — Z853 Personal history of malignant neoplasm of breast: Secondary | ICD-10-CM | POA: Diagnosis not present

## 2020-07-03 DIAGNOSIS — D7389 Other diseases of spleen: Secondary | ICD-10-CM | POA: Diagnosis not present

## 2020-07-03 DIAGNOSIS — I898 Other specified noninfective disorders of lymphatic vessels and lymph nodes: Secondary | ICD-10-CM | POA: Diagnosis not present

## 2020-07-03 DIAGNOSIS — C50412 Malignant neoplasm of upper-outer quadrant of left female breast: Secondary | ICD-10-CM | POA: Insufficient documentation

## 2020-07-03 DIAGNOSIS — K8689 Other specified diseases of pancreas: Secondary | ICD-10-CM | POA: Diagnosis not present

## 2020-07-03 DIAGNOSIS — Z171 Estrogen receptor negative status [ER-]: Secondary | ICD-10-CM | POA: Insufficient documentation

## 2020-07-03 DIAGNOSIS — I2510 Atherosclerotic heart disease of native coronary artery without angina pectoris: Secondary | ICD-10-CM | POA: Diagnosis not present

## 2020-07-03 DIAGNOSIS — M549 Dorsalgia, unspecified: Secondary | ICD-10-CM | POA: Diagnosis not present

## 2020-07-03 LAB — POCT I-STAT CREATININE: Creatinine, Ser: 1.2 mg/dL — ABNORMAL HIGH (ref 0.44–1.00)

## 2020-07-03 MED ORDER — IOHEXOL 300 MG/ML  SOLN
100.0000 mL | Freq: Once | INTRAMUSCULAR | Status: AC | PRN
  Administered 2020-07-03: 100 mL via INTRAVENOUS

## 2020-07-04 ENCOUNTER — Encounter: Payer: Self-pay | Admitting: Hematology and Oncology

## 2020-07-04 NOTE — Progress Notes (Signed)
HEMATOLOGY-ONCOLOGY TELEPHONE VISIT PROGRESS NOTE  I connected with Carol Byrd on 07/05/2020 at  9:45 AM EST by telephone and verified that I am speaking with the correct person using two identifiers.  I discussed the limitations, risks, security and privacy concerns of performing an evaluation and management service by telephone and the availability of in person appointments.  I also discussed with the patient that there may be a patient responsible charge related to this service. The patient expressed understanding and agreed to proceed.   History of Present Illness: Carol Byrd is a 75 y.o. female with above-mentioned history of triple negativeleft breast cancertreated withneoadjuvant chemotherapy, left lumpectomy, radiation, and is currently on surveillance.CT CAP on 07/03/20 showed no evidence of metastatic disease. She presents over the phone today to review her scan.  Abdominal symptoms have resolved.  Oncology History  Malignant neoplasm of upper-outer quadrant of left breast in female, estrogen receptor negative ([place])  11/08/2018 Initial Diagnosis   Routine screening mammogram detected 2 masses in the left breast, measuring 1.6cm and 1.1cm, spanning a total of 3cm. Biopsy confirmed IDC with DCIS, HER-2 negative by FISH (2+ by IHC), ER/PR negative, Ki67 80%.    11/18/2018 Breast MRI   2.9 centimeter bilobed mass in the UOQ of the left breast, consistent with known malignancy. 0.5 centimeter satellite nodule is 0.5 centimeters below this mass.   11/22/2018 Cancer Staging   Staging form: Breast, AJCC 8th Edition - Clinical stage from 11/22/2018: Stage IIB (cT2, cN0, cM0, G3, ER-, PR-, HER2-)    11/27/2018 Genetic Testing   Patient had genetic testing done for a personal and family history of breast cancer.  Results were negative on the [facility] Breast cancer STAT gene panel. The STAT Breast cancer panel offered by [facility] includes sequencing and rearrangement  analysis for the following 7 genes:  BRCA1, BRCA2, CDH1, PALB2, PTEN, STK11 and TP53.  The report date is 11/27/2018.   12/01/2018 - 01/25/2019 Chemotherapy   DOXOrubicin (ADRIAMYCIN) chemo injection 114 mg, 60 mg/m2 = 114 mg, Intravenous,  Once, 4 of 4 cycles. Dose modification: 50 mg/m2 (original dose 60 mg/m2, Cycle 2, Reason: Dose not tolerated), 40 mg/m2 (original dose 60 mg/m2, Cycle 4, Reason: Dose not tolerated). Administration: 114 mg (12/01/2018), 96 mg (12/15/2018), 96 mg (12/29/2018), 76 mg (01/12/2019)  palonosetron (ALOXI) injection 0.25 mg, 0.25 mg, Intravenous,  Once, 5 of 8 cycles. Administration: 0.25 mg (12/01/2018), 0.25 mg (01/25/2019), 0.25 mg (12/15/2018), 0.25 mg (12/29/2018), 0.25 mg (01/12/2019)  pegfilgrastim-jmdb (FULPHILA) injection 6 mg, 6 mg, Subcutaneous,  Once, 4 of 4 cycles. Administration: 6 mg (12/03/2018), 6 mg (12/17/2018), 6 mg (12/31/2018), 6 mg (01/14/2019)  CARBOplatin (PARAPLATIN) 510 mg in sodium chloride 0.9 % 250 mL chemo infusion, 510 mg (106.9 % of original dose 478.8 mg), Intravenous,  Once, 1 of 4 cycles. Dose modification: (original dose 478.8 mg, Cycle 5). Administration: 510 mg (01/25/2019)  cyclophosphamide (CYTOXAN) 1,140 mg in sodium chloride 0.9 % 250 mL chemo infusion, 600 mg/m2 = 1,140 mg, Intravenous,  Once, 4 of 4 cycles. Dose modification: 500 mg/m2 (original dose 600 mg/m2, Cycle 2, Reason: Dose not tolerated), 400 mg/m2 (original dose 600 mg/m2, Cycle 4, Reason: Dose not tolerated). Administration: 1,140 mg (12/01/2018), 960 mg (12/15/2018), 960 mg (12/29/2018), 760 mg (01/12/2019)  PACLitaxel (TAXOL) 150 mg in sodium chloride 0.9 % 250 mL chemo infusion (</= 64m/m2), 80 mg/m2 = 150 mg, Intravenous,  Once, 1 of 4 cycles Administration: 150 mg (01/25/2019)  fosaprepitant (EMEND) 150 mg  dexamethasone (DECADRON) 12 mg in sodium chloride 0.9 % 145 mL IVPB, , Intravenous,  Once, 5 of 8 cycles Administration:  (12/01/2018),  (01/25/2019),  (12/15/2018),  (12/29/2018),   (01/12/2019)   03/29/2019 Surgery   Left lumpectomy [name]) [phone]): scattered microscopic foci of high grade DCIS with no invasive carcinoma, clear margins, 2 left axillary lymph nodes negative.   03/29/2019 Cancer Staging   Staging form: Breast, AJCC 8th Edition - Pathologic stage from 03/29/2019: No Stage Recommended (ypTis (DCIS), pN0, cM0)    05/03/2019 - 05/31/2019 Radiation Therapy   The patient initially received a dose of 40.05 Gy in 15 fractions to the breast using whole-breast tangent fields. This was delivered using a 3-D conformal technique. The pt received a boost delivering an additional 10 Gy in 5 fractions using a electron boost with 21mV electrons. The total dose was 50.05 Gy.      Observations/Objective:    Assessment Plan:  Malignant neoplasm of upper-outer quadrant of left breast in [place], [place]: Routine screening mammogram detected 2 masses in the left breast, measuring 1.6cm and 1.1cm, spanning a total of 3cm. Biopsy confirmed IDC with DCIS, HER-2 negative by FISH (2+ by IHC), ER/PR negative, Ki67 80%.  Staging: T2N0 stage IIb clinical stage Breast MRI: 11/18/2018: 2.9 cm bilobed mass UOQ left breast  Treatment plan 1.Neoadjuvant chemotherapy with dose dense Adriamycin and Cytoxan x4 followed by Taxol and carboplatincompleted 01/25/2019 2.05/28/18:Left lumpectomy ([name]: scattered microscopic foci of high grade DCIS with no invasive carcinoma, clear margins, 2 left axillary lymph nodes negative. 2.Followed by adjuvant radiation12/15/2020-05/24/2019 -------------------------------------------------------------------------------------------------- Treatment plan: surveillance  Breast cancer surveillance: 1.Mammogram 10/31/2019: Benign no evidence of malignancy expected postlumpectomy changes. Density C 2. Breast exam: 06/25/2020: Benign  Chemo-induced peripheral neuropathy: Resolved Abdominal pain and  flank pain: CT CAP 07/03/20: No mets. Ill defined area Head of pancreas (Needs MRCP) Will order MRCP and call her with the results  Aortic valve calcs: ECHO ordered: She has seen a cardiologist before.  If necessary we will refer her back again.  Telephone visit after MRCP to discuss the results and see if she needs to see a gastroenterologist.  I discussed the assessment and treatment plan with the patient. The patient was provided an opportunity to ask questions and all were answered. The patient agreed with the plan and demonstrated an understanding of the instructions. The patient was advised to call back or seek an in-person evaluation if the symptoms worsen or if the condition fails to improve as anticipated.   Total time spent: 15 mins including non-face to face time and time spent for planning, charting and coordination of care  VRulon Eisenmenger MD 07/05/2020    I, MCloyde ReamsDorshimer, am acting as scribe for VNicholas Lose MD.  I have reviewed the above documentation for accuracy and completeness, and I agree with the above.

## 2020-07-04 NOTE — Assessment & Plan Note (Addendum)
11/08/2018: Routine screening mammogram detected 2 masses in the left breast, measuring 1.6cm and 1.1cm, spanning a total of 3cm. Biopsy confirmed IDC with DCIS, HER-2 negative by FISH (2+ by IHC), ER/PR negative, Ki67 80%.  Staging: T2N0 stage IIb clinical stage Breast MRI: 11/18/2018: 2.9 cm bilobed mass UOQ left breast  Treatment plan 1.Neoadjuvant chemotherapy with dose dense Adriamycin and Cytoxan x4 followed by Taxol and carboplatincompleted 01/25/2019 2.05/28/18:Left lumpectomy Carol Byrd): scattered microscopic foci of high grade DCIS with no invasive carcinoma, clear margins, 2 left axillary lymph nodes negative. 2.Followed by adjuvant radiation12/15/2020-05/24/2019 -------------------------------------------------------------------------------------------------- Treatment plan: surveillance  Breast cancer surveillance: 1.Mammogram 10/31/2019: Benign no evidence of malignancy expected postlumpectomy changes. Density C 2. Breast exam: 06/25/2020: Benign  Chemo-induced peripheral neuropathy: Resolved Abdominal pain and flank pain: CT CAP 07/03/20: No mets. Ill defined area Head of pancreas (Needs MRCP) I recommended GI consult for ERCP vs EUS  Return to clinic based on the results

## 2020-07-05 ENCOUNTER — Inpatient Hospital Stay (HOSPITAL_BASED_OUTPATIENT_CLINIC_OR_DEPARTMENT_OTHER): Payer: Medicare Other | Admitting: Hematology and Oncology

## 2020-07-05 DIAGNOSIS — Z171 Estrogen receptor negative status [ER-]: Secondary | ICD-10-CM | POA: Diagnosis not present

## 2020-07-05 DIAGNOSIS — C50412 Malignant neoplasm of upper-outer quadrant of left female breast: Secondary | ICD-10-CM | POA: Diagnosis not present

## 2020-07-06 ENCOUNTER — Inpatient Hospital Stay: Payer: Medicare Other | Admitting: Hematology and Oncology

## 2020-07-09 ENCOUNTER — Encounter: Payer: Self-pay | Admitting: *Deleted

## 2020-07-11 ENCOUNTER — Ambulatory Visit: Payer: Medicare Other | Admitting: Cardiology

## 2020-07-16 DIAGNOSIS — I10 Essential (primary) hypertension: Secondary | ICD-10-CM | POA: Diagnosis not present

## 2020-07-16 DIAGNOSIS — E785 Hyperlipidemia, unspecified: Secondary | ICD-10-CM | POA: Diagnosis not present

## 2020-07-16 DIAGNOSIS — E038 Other specified hypothyroidism: Secondary | ICD-10-CM | POA: Diagnosis not present

## 2020-07-18 ENCOUNTER — Other Ambulatory Visit: Payer: Self-pay

## 2020-07-18 ENCOUNTER — Ambulatory Visit (HOSPITAL_COMMUNITY)
Admission: RE | Admit: 2020-07-18 | Discharge: 2020-07-18 | Disposition: A | Discharge status: Home / Self Care | Payer: Medicare Other | Source: Ambulatory Visit | Attending: Hematology and Oncology | Admitting: Hematology and Oncology

## 2020-07-18 DIAGNOSIS — E785 Hyperlipidemia, unspecified: Secondary | ICD-10-CM | POA: Insufficient documentation

## 2020-07-18 DIAGNOSIS — R1011 Right upper quadrant pain: Secondary | ICD-10-CM | POA: Insufficient documentation

## 2020-07-18 DIAGNOSIS — I359 Nonrheumatic aortic valve disorder, unspecified: Secondary | ICD-10-CM

## 2020-07-18 DIAGNOSIS — Z923 Personal history of irradiation: Secondary | ICD-10-CM | POA: Diagnosis not present

## 2020-07-18 DIAGNOSIS — Z9221 Personal history of antineoplastic chemotherapy: Secondary | ICD-10-CM | POA: Diagnosis not present

## 2020-07-18 DIAGNOSIS — C50412 Malignant neoplasm of upper-outer quadrant of left female breast: Secondary | ICD-10-CM | POA: Diagnosis not present

## 2020-07-18 DIAGNOSIS — Z171 Estrogen receptor negative status [ER-]: Secondary | ICD-10-CM | POA: Diagnosis not present

## 2020-07-18 DIAGNOSIS — I10 Essential (primary) hypertension: Secondary | ICD-10-CM | POA: Diagnosis not present

## 2020-07-18 LAB — ECHOCARDIOGRAM COMPLETE
AR max vel: 1.63 cm2
AV Area VTI: 1.66 cm2
AV Area mean vel: 1.59 cm2
AV Mean grad: 3 mmHg
AV Peak grad: 5.7 mmHg
Ao pk vel: 1.19 m/s
Area-P 1/2: 2.37 cm2
S' Lateral: 2.8 cm

## 2020-07-18 NOTE — Progress Notes (Signed)
*  PRELIMINARY RESULTS* Echocardiogram 2D Echocardiogram has been performed.  Carol Byrd 07/18/2020, 3:46 PM

## 2020-07-19 ENCOUNTER — Ambulatory Visit (HOSPITAL_COMMUNITY)
Admission: RE | Admit: 2020-07-19 | Discharge: 2020-07-19 | Disposition: A | Discharge status: Home / Self Care | Payer: Medicare Other | Source: Ambulatory Visit | Attending: Hematology and Oncology | Admitting: Hematology and Oncology

## 2020-07-19 ENCOUNTER — Other Ambulatory Visit: Payer: Self-pay | Admitting: Hematology and Oncology

## 2020-07-19 DIAGNOSIS — K3189 Other diseases of stomach and duodenum: Secondary | ICD-10-CM | POA: Diagnosis not present

## 2020-07-19 DIAGNOSIS — Z9221 Personal history of antineoplastic chemotherapy: Secondary | ICD-10-CM | POA: Diagnosis not present

## 2020-07-19 DIAGNOSIS — C50412 Malignant neoplasm of upper-outer quadrant of left female breast: Secondary | ICD-10-CM

## 2020-07-19 DIAGNOSIS — Z171 Estrogen receptor negative status [ER-]: Secondary | ICD-10-CM | POA: Diagnosis not present

## 2020-07-19 DIAGNOSIS — I10 Essential (primary) hypertension: Secondary | ICD-10-CM | POA: Diagnosis not present

## 2020-07-19 DIAGNOSIS — R1011 Right upper quadrant pain: Secondary | ICD-10-CM | POA: Diagnosis not present

## 2020-07-19 DIAGNOSIS — E785 Hyperlipidemia, unspecified: Secondary | ICD-10-CM | POA: Diagnosis not present

## 2020-07-19 DIAGNOSIS — I359 Nonrheumatic aortic valve disorder, unspecified: Secondary | ICD-10-CM | POA: Diagnosis not present

## 2020-07-19 DIAGNOSIS — Z923 Personal history of irradiation: Secondary | ICD-10-CM | POA: Diagnosis not present

## 2020-07-19 MED ORDER — GADOBUTROL 1 MMOL/ML IV SOLN
7.0000 mL | Freq: Once | INTRAVENOUS | Status: AC | PRN
  Administered 2020-07-19: 7 mL via INTRAVENOUS

## 2020-07-24 NOTE — Progress Notes (Signed)
HEMATOLOGY-ONCOLOGY TELEPHONE VISIT PROGRESS NOTE  I connected with Carol Byrd on 07/25/2020 at  9:45 AM EST by telephone and verified that I am speaking with the correct person using two identifiers.  I discussed the limitations, risks, security and privacy concerns of performing an evaluation and management service by telephone and the availability of in person appointments.  I also discussed with the patient that there may be a patient responsible charge related to this service. The patient expressed understanding and agreed to proceed.   History of Present Illness: Carol Byrd is a 75 y.o. female with above-mentioned history of triple negativeleft breast cancertreated withneoadjuvant chemotherapy, left lumpectomy, radiation, and is currently on surveillance.MRCP on 07/19/20 showed no pancreatic mass lesion, but showed persistent gastric mucosal thickening within the fundus and body of the stomach of unclear etiology. She presents over the phone today to review her scan.   Oncology History  Malignant neoplasm of upper-outer quadrant of left breast in female, estrogen receptor negative ([place])  11/08/2018 Initial Diagnosis   Routine screening mammogram detected 2 masses in the left breast, measuring 1.6cm and 1.1cm, spanning a total of 3cm. Biopsy confirmed IDC with DCIS, HER-2 negative by FISH (2+ by IHC), ER/PR negative, Ki67 80%.    11/18/2018 Breast MRI   2.9 centimeter bilobed mass in the UOQ of the left breast, consistent with known malignancy. 0.5 centimeter satellite nodule is 0.5 centimeters below this mass.   11/22/2018 Cancer Staging   Staging form: Breast, AJCC 8th Edition - Clinical stage from 11/22/2018: Stage IIB (cT2, cN0, cM0, G3, ER-, PR-, HER2-)    11/27/2018 Genetic Testing   Patient had genetic testing done for a personal and family history of breast cancer.  Results were negative on the [facility] Breast cancer STAT gene panel. The STAT Breast cancer  panel offered by [facility] includes sequencing and rearrangement analysis for the following 7 genes:  BRCA1, BRCA2, CDH1, PALB2, PTEN, STK11 and TP53.  The report date is 11/27/2018.   12/01/2018 - 01/25/2019 Chemotherapy   DOXOrubicin (ADRIAMYCIN) chemo injection 114 mg, 60 mg/m2 = 114 mg, Intravenous,  Once, 4 of 4 cycles. Dose modification: 50 mg/m2 (original dose 60 mg/m2, Cycle 2, Reason: Dose not tolerated), 40 mg/m2 (original dose 60 mg/m2, Cycle 4, Reason: Dose not tolerated). Administration: 114 mg (12/01/2018), 96 mg (12/15/2018), 96 mg (12/29/2018), 76 mg (01/12/2019)  palonosetron (ALOXI) injection 0.25 mg, 0.25 mg, Intravenous,  Once, 5 of 8 cycles. Administration: 0.25 mg (12/01/2018), 0.25 mg (01/25/2019), 0.25 mg (12/15/2018), 0.25 mg (12/29/2018), 0.25 mg (01/12/2019)  pegfilgrastim-jmdb (FULPHILA) injection 6 mg, 6 mg, Subcutaneous,  Once, 4 of 4 cycles. Administration: 6 mg (12/03/2018), 6 mg (12/17/2018), 6 mg (12/31/2018), 6 mg (01/14/2019)  CARBOplatin (PARAPLATIN) 510 mg in sodium chloride 0.9 % 250 mL chemo infusion, 510 mg (106.9 % of original dose 478.8 mg), Intravenous,  Once, 1 of 4 cycles. Dose modification: (original dose 478.8 mg, Cycle 5). Administration: 510 mg (01/25/2019)  cyclophosphamide (CYTOXAN) 1,140 mg in sodium chloride 0.9 % 250 mL chemo infusion, 600 mg/m2 = 1,140 mg, Intravenous,  Once, 4 of 4 cycles. Dose modification: 500 mg/m2 (original dose 600 mg/m2, Cycle 2, Reason: Dose not tolerated), 400 mg/m2 (original dose 600 mg/m2, Cycle 4, Reason: Dose not tolerated). Administration: 1,140 mg (12/01/2018), 960 mg (12/15/2018), 960 mg (12/29/2018), 760 mg (01/12/2019)  PACLitaxel (TAXOL) 150 mg in sodium chloride 0.9 % 250 mL chemo infusion (</= 30m/m2), 80 mg/m2 = 150 mg, Intravenous,  Once, 1 of 4 cycles  Administration: 150 mg (01/25/2019)  fosaprepitant (EMEND) 150 mg   dexamethasone (DECADRON) 12 mg in sodium chloride 0.9 % 145 mL IVPB, , Intravenous,  Once, 5 of 8  cycles Administration:  (12/01/2018),  (01/25/2019),  (12/15/2018),  (12/29/2018),  (01/12/2019)   03/29/2019 Surgery   Left lumpectomy [name]) [phone]): scattered microscopic foci of high grade DCIS with no invasive carcinoma, clear margins, 2 left axillary lymph nodes negative.   03/29/2019 Cancer Staging   Staging form: Breast, AJCC 8th Edition - Pathologic stage from 03/29/2019: No Stage Recommended (ypTis (DCIS), pN0, cM0)    05/03/2019 - 05/31/2019 Radiation Therapy   The patient initially received a dose of 40.05 Gy in 15 fractions to the breast using whole-breast tangent fields. This was delivered using a 3-D conformal technique. The pt received a boost delivering an additional 10 Gy in 5 fractions using a electron boost with 68mV electrons. The total dose was 50.05 Gy.      Observations/Objective:     Assessment Plan:  Malignant neoplasm of upper-outer quadrant of left breast in [place], [place]: Routine screening mammogram detected 2 masses in the left breast, measuring 1.6cm and 1.1cm, spanning a total of 3cm. Biopsy confirmed IDC with DCIS, HER-2 negative by FISH (2+ by IHC), ER/PR negative, Ki67 80%.  Staging: T2N0 stage IIb clinical stage Breast MRI: 11/18/2018: 2.9 cm bilobed mass UOQ left breast  Treatment plan 1.Neoadjuvant chemotherapy with dose dense Adriamycin and Cytoxan x4 followed by Taxol and carboplatincompleted 01/25/2019 2.05/28/18:Left lumpectomy ([name]: scattered microscopic foci of high grade DCIS with no invasive carcinoma, clear margins, 2 left axillary lymph nodes negative. 2.Followed by adjuvant radiation12/15/2020-05/24/2019 -------------------------------------------------------------------------------------------------- Treatment plan:Surveillance  Breast cancer surveillance: 1.Mammogram 10/31/2019: Benign no evidence of malignancy expected postlumpectomy changes. Density C 2.Breast  exam:06/25/2020: Benign  Chemo-induced peripheral neuropathy:Resolved Abdominal pain and flank pain:  CT CAP 07/03/20: No mets. Ill defined area Head of pancreas MRCP 07/20/2020: No pancreatic mass lesion, gastric mucosal thickening suggestive of gastritis.  [facility] [place] [facility] [place] [place] [facility] [place]   I discussed the assessment and treatment plan with the patient. The patient was provided an opportunity to ask questions and all were answered. The patient agreed with the plan and demonstrated an understanding of the instructions. The patient was advised to call back or seek an in-person evaluation if the symptoms worsen or if the condition fails to improve as anticipated.   Total time spent: 12 mins including non-face to face time and time spent for planning, charting and coordination of care  VRulon Eisenmenger MD 07/25/2020    I, MCloyde ReamsDorshimer, am acting as scribe for VNicholas Lose MD.  I have reviewed the above documentation for accuracy and completeness, and I agree with the above.

## 2020-07-25 ENCOUNTER — Inpatient Hospital Stay: Payer: Medicare Other | Attending: Hematology and Oncology | Admitting: Hematology and Oncology

## 2020-07-25 DIAGNOSIS — Z171 Estrogen receptor negative status [ER-]: Secondary | ICD-10-CM

## 2020-07-25 DIAGNOSIS — C50412 Malignant neoplasm of upper-outer quadrant of left female breast: Secondary | ICD-10-CM | POA: Diagnosis not present

## 2020-07-25 NOTE — Assessment & Plan Note (Addendum)
11/08/2018: Routine screening mammogram detected 2 masses in the left breast, measuring 1.6cm and 1.1cm, spanning a total of 3cm. Biopsy confirmed IDC with DCIS, HER-2 negative by FISH (2+ by IHC), ER/PR negative, Ki67 80%.  Staging: T2N0 stage IIb clinical stage Breast MRI: 11/18/2018: 2.9 cm bilobed mass UOQ left breast  Treatment plan 1.Neoadjuvant chemotherapy with dose dense Adriamycin and Cytoxan x4 followed by Taxol and carboplatincompleted 01/25/2019 2.05/28/18:Left lumpectomy Carol Byrd): scattered microscopic foci of high grade DCIS with no invasive carcinoma, clear margins, 2 left axillary lymph nodes negative. 2.Followed by adjuvant radiation12/15/2020-05/24/2019 -------------------------------------------------------------------------------------------------- Treatment plan:Surveillance  Breast cancer surveillance: 1.Mammogram 10/31/2019: Benign no evidence of malignancy expected postlumpectomy changes. Density C 2.Breast exam:06/25/2020: Benign  Chemo-induced peripheral neuropathy:Resolved Abdominal pain and flank pain: CT CAP 07/03/20: No mets. Ill defined area Head of pancreas MRCP 07/20/2020: No pancreatic mass lesion, gastric mucosal thickening suggestive of gastritis.  We will refer her to gastroenterology for consultation.

## 2020-07-26 ENCOUNTER — Ambulatory Visit (INDEPENDENT_AMBULATORY_CARE_PROVIDER_SITE_OTHER): Payer: Medicare Other | Admitting: Gastroenterology

## 2020-07-26 ENCOUNTER — Encounter (INDEPENDENT_AMBULATORY_CARE_PROVIDER_SITE_OTHER): Payer: Self-pay

## 2020-07-26 ENCOUNTER — Telehealth: Payer: Self-pay | Admitting: Hematology and Oncology

## 2020-07-26 ENCOUNTER — Other Ambulatory Visit (INDEPENDENT_AMBULATORY_CARE_PROVIDER_SITE_OTHER): Payer: Self-pay

## 2020-07-26 ENCOUNTER — Encounter (INDEPENDENT_AMBULATORY_CARE_PROVIDER_SITE_OTHER): Payer: Self-pay | Admitting: Gastroenterology

## 2020-07-26 ENCOUNTER — Other Ambulatory Visit: Payer: Self-pay

## 2020-07-26 DIAGNOSIS — R933 Abnormal findings on diagnostic imaging of other parts of digestive tract: Secondary | ICD-10-CM | POA: Insufficient documentation

## 2020-07-26 HISTORY — DX: Abnormal findings on diagnostic imaging of other parts of digestive tract: R93.3

## 2020-07-26 NOTE — Patient Instructions (Signed)
Schedule EGD

## 2020-07-26 NOTE — H&P (View-Only) (Signed)
Carol Byrd, M.D. Gastroenterology & Hepatology [facility] [address] [place], [place] [address]  Primary Care Physician: Fagan, Roy, MD [address] [place]  [address]  I will communicate my assessment and recommendations to the referring MD via EMR.  Problems: 1. Gastric wall thickening  History of Present Illness: Carol Byrd is a 75 y.o. female with past medical history of breast cancer status post chemotherapy, hypertension, GERD, hypothyroidism, hyperlipidemia, who presents for evaluation of abnormal findings on MRI.  Patient reports she has been asymptomatic denies having any complaints.As part of the surveillance of her breast cancer, the patient underwent a CT chest, abdomen and pelvis from 07/03/2020 which showed presence of hypervascular area in the pancreas suspicious for mass measuring 3 x 1.8 cm without presence of ductal dilation in the hepatobiliary tract and pancreatic duct.  This was followed by an MRCP which was performed on 07/19/2020 which was negative for any pancreatic lesion but showed persistence of a broad region of gastric mucosal thickening in the fundus and body of the stomach.  She was recommended to have endoscopic evaluation for this.  The patient denies having any nausea, vomiting, fever, chills, hematochezia, melena, hematemesis, abdominal distention, abdominal pain, diarrhea, jaundice, pruritus or weight loss. Her weight has been stable since she had chemotherapy.  Has not taken her Protonix for the last 8 months as she has not had any heartburn or other complaints.  Last EGD: 04/14/18 - Esophageal polyp(s) were found. Biopsied. - 2 cm hiatal hernia. - No endoscopic esophageal abnormality to explain patient's dysphagia. Esophagus dilated. - Normal duodenal bulb and second portion of the duodenum. Last Colonoscopy: 2016 - Prep satisfactory. Scattered diverticula throughout  the colon but most of these were located in the region of hepatic flexure. Normal rectal mucosa. Small hemorrhoids below the dentate line.  Past Medical History: Past Medical History:  Diagnosis Date  . Alopecia   . Breast cancer (HCC)   . Cervical disc disease   . Essential hypertension   . Family history of breast cancer   . Family history of lung cancer   . Family history of melanoma   . Family history of multiple myeloma   . Family history of stomach cancer   . GERD (gastroesophageal reflux disease)   . Hypothyroidism   . Impaired fasting glucose   . Mixed hyperlipidemia   . Personal history of chemotherapy   . Personal history of radiation therapy   . Vitamin D deficiency     Past Surgical History: Past Surgical History:  Procedure Laterality Date  . BACK SURGERY  1985   bulging lumbar disk  . BIOPSY  04/14/2018   Procedure: BIOPSY;  Surgeon: Rehman, Najeeb U, MD;  [facility];  Service: Endoscopy;;  esophagus  . BREAST LUMPECTOMY Left 03/2019  . BREAST LUMPECTOMY WITH RADIOACTIVE SEED AND SENTINEL LYMPH NODE BIOPSY Left 03/29/2019   Procedure: LEFT BREAST LUMPECTOMY WITH RADIOACTIVE SEED AND LEFT AXILLARY SENTINEL LYMPH NODE BIOPSY BLUE DYE INJECTION;  Surgeon: Wakefield, Matthew, MD;  Location: [facility];  Service: General;  Laterality: Left;  . cataracts    . COLONOSCOPY N/A 02/14/2015   Procedure: COLONOSCOPY;  Surgeon: Najeeb U Rehman, MD;  [facility];  Service: Endoscopy;  Laterality: N/A;  1200  . ESOPHAGEAL DILATION N/A 04/14/2018   Procedure: ESOPHAGEAL DILATION;  Surgeon: Rehman, Najeeb U, MD;  [facility];  Service: Endoscopy;  Laterality: N/A;  .   ESOPHAGOGASTRODUODENOSCOPY N/A 04/14/2018   Procedure: ESOPHAGOGASTRODUODENOSCOPY (EGD);  Surgeon: Rogene Houston, MD;  [facility];  Service: Endoscopy;  Laterality: N/A;  3:10  . PORT-A-CATH REMOVAL Right 03/29/2019   Procedure: REMOVAL  PORT-A-CATH;  Surgeon: Rolm Bookbinder, MD;  Location: [facility];  Service: General;  Laterality: Right;  . PORTACATH PLACEMENT Right 11/30/2018   Procedure: INSERTION PORT-A-CATH WITH ULTRASOUND;  Surgeon: Rolm Bookbinder, MD;  Location: [facility];  Service: General;  Laterality: Right;  . RETINAL TEAR REPAIR CRYOTHERAPY    . THYROIDECTOMY      Family History: Family History  Problem Relation Age of Onset  . Depression Mother   . Hypertension Mother   . Heart attack Father   . Heart disease Other   . Multiple myeloma Cousin 20       maternal first cousin  . Breast cancer Sister 26       second breast cancer diagnosed at 50  . Breast cancer Maternal Aunt 80  . Cancer Maternal Uncle        unknown  . Stomach cancer Maternal Grandfather 80  . Lung cancer Maternal Uncle 61  . Lung cancer Maternal Uncle 5  . Lung cancer Cousin        maternal first cousins  . Cancer Cousin        unknown, paternal first cousins  . Colon cancer Neg Hx     Social History: Social History   Tobacco Use  Smoking Status Former Smoker  . Types: Cigarettes  . Quit date: 04/14/1994  . Years since quitting: 26.3  Smokeless Tobacco Never Used   Social History   Substance and Sexual Activity  Alcohol Use Not Currently  . Alcohol/week: 2.0 standard drinks  . Types: 2 Glasses of wine per week   Comment: wine but not recently   Social History   Substance and Sexual Activity  Drug Use No    Allergies: Allergies  Allergen Reactions  . Alpha-Gal Anaphylaxis, Itching and Swelling  . Tuna Oil [Fish Oil] Diarrhea    Medications: Current Outpatient Medications  Medication Sig Dispense Refill  . atorvastatin (LIPITOR) 20 MG tablet Take 20 mg by mouth See admin instructions. Takes in the evening on Tuesdays only    . cholecalciferol (VITAMIN D3) 25 MCG (1000 UT) tablet Take 2,000 Units by mouth daily.    Marland Kitchen ezetimibe (ZETIA) 10 MG tablet Take 10 mg by mouth at bedtime.    Marland Kitchen  levothyroxine (SYNTHROID) 75 MCG tablet Take 75 mcg by mouth daily.    . Methylcellulose, Laxative, (CITRUCEL) 500 MG TABS Take 500 mg by mouth daily.    Vladimir Faster Glycol-Propyl Glycol (SYSTANE) 0.4-0.3 % SOLN Place 1 drop into both eyes at bedtime as needed (dry eyes).    Marland Kitchen telmisartan (MICARDIS) 40 MG tablet Take 40 mg by mouth daily.     No current facility-administered medications for this visit.    Review of Systems: GENERAL: negative for malaise, night sweats HEENT: No changes in hearing or vision, no nose bleeds or other nasal problems. NECK: Negative for lumps, goiter, pain and significant neck swelling RESPIRATORY: Negative for cough, wheezing CARDIOVASCULAR: Negative for chest pain, leg swelling, palpitations, orthopnea GI: SEE HPI MUSCULOSKELETAL: Negative for joint pain or swelling, back pain, and muscle pain. SKIN: Negative for lesions, rash PSYCH: Negative for sleep disturbance, mood disorder and recent psychosocial stressors. HEMATOLOGY Negative for prolonged bleeding, bruising easily, and swollen nodes. ENDOCRINE: Negative for cold or heat intolerance, polyuria,  polydipsia and goiter. NEURO: negative for tremor, gait imbalance, syncope and seizures. The remainder of the review of systems is noncontributory.   Physical Exam: BP (!) 156/76 (BP Location: Left Arm, Patient Position: Sitting, Cuff Size: Large)   Pulse 97   Temp 98.6 F (37 C) (Oral)   Ht 5' 6"  (1.676 m)   Wt 165 lb (74.8 kg)   BMI 26.63 kg/m  GENERAL: The patient is AO x3, in no acute distress. HEENT: Head is normocephalic and atraumatic. EOMI are intact. Mouth is well hydrated and without lesions. NECK: Supple. No masses LUNGS: Clear to auscultation. No presence of rhonchi/wheezing/rales. Adequate chest expansion HEART: RRR, normal s1 and s2. ABDOMEN: Soft, nontender, no guarding, no peritoneal signs, and nondistended. BS +. No masses. EXTREMITIES: Without any cyanosis, clubbing, rash, lesions or  edema. NEUROLOGIC: AOx3, no focal motor deficit. SKIN: no jaundice, no rashes  Imaging/Labs: as above  I personally reviewed and interpreted the available labs, imaging and endoscopic files.  Impression and Plan: Carol Byrd is a 75 y.o. female with past medical history of breast cancer status post chemotherapy, hypertension, GERD, hypothyroidism, hyperlipidemia, who presents for evaluation of abnormal findings on MRI.  Has been completely asymptomatic and was found to have incidental thickening of the gastric wall on recent abdominal imaging.  I considered these alterations could be an artifact due to peristalsis but given history of breast cancer and high concern by the patient, we will proceed with an EGD to rule out any intraluminal or mucosal pathology to explain these alterations.  Patient understood and agreed.  - Schedule EGD  All questions were answered.      Harvel Quale, MD Gastroenterology and Hepatology [facility]

## 2020-07-26 NOTE — Telephone Encounter (Signed)
Scheduled per 3/9 los. Mailing appt letter and calendar printout.

## 2020-07-26 NOTE — Progress Notes (Signed)
Cardiology Office Note  Date: 07/27/2020   ID: Carol, Byrd February 21, 1946, MRN [id number]  PCP:  [name], MD  Cardiologist:  Rozann Lesches, MD Electrophysiologist:  None   Chief Complaint  Patient presents with  . Cardiac follow-up    History of Present Illness: Carol Byrd is a 75 y.o. female seen in consultation via telehealth encounter in April 2020.  She presents back to the office after recent CT imaging arranged by her oncologist, Dr. Lindi Byrd.  I see that she had a CT of her abdomen and pelvis done in February.  Imaging was done in follow-up of breast cancer status post previous treatment with Adriamycin and Cytoxan followed by Taxol, also left lumpectomy and adjuvant radiation. This study incidentally described two-vessel coronary artery calcification, aortic atherosclerosis, and also calcification of the aortic valve.  Recent echocardiogram done in early March shows normal LVEF at 60 to 97%, mild diastolic dysfunction, no aortic stenosis.  She is here today with her husband.  She does not report any exertional chest tightness, does mention family history of heart disease in relatives at younger age, but she has done generally well over time.  She is on once weekly Lipitor per Dr. Willey Blade, also Zetia and Micardis.  We are requesting her most recent lipid panel.  I personally reviewed her ECG today which shows normal sinus rhythm.  Prior cardiac monitoring in 2020 demonstrated rare atrial and ventricular ectopy with brief runs of both atrial tachycardia and NSVT.  She has not undergone any formal ischemic testing.  Past Medical History:  Diagnosis Date  . Alopecia   . Breast cancer ([place])   . Cervical disc disease   . Essential hypertension   . Family history of breast cancer   . Family history of lung cancer   . Family history of melanoma   . Family history of multiple myeloma   . Family history of stomach cancer   . GERD (gastroesophageal  reflux disease)   . Hypothyroidism   . Impaired fasting glucose   . Mixed hyperlipidemia   . Personal history of chemotherapy   . Personal history of radiation therapy   . Vitamin D deficiency     Past Surgical History:  Procedure Laterality Date  . BACK SURGERY  1985   bulging lumbar disk  . BIOPSY  04/14/2018   Procedure: BIOPSY;  Surgeon: Rogene Houston, MD;  [facility];  Service: Endoscopy;;  esophagus  . BREAST LUMPECTOMY Left 03/2019  . BREAST LUMPECTOMY WITH RADIOACTIVE SEED AND SENTINEL LYMPH NODE BIOPSY Left 03/29/2019   Procedure: LEFT BREAST LUMPECTOMY WITH RADIOACTIVE SEED AND LEFT AXILLARY SENTINEL LYMPH NODE BIOPSY BLUE DYE INJECTION;  Surgeon: Rolm Bookbinder, MD;  Location: [place];  Service: General;  Laterality: Left;  . cataracts    . COLONOSCOPY N/A 02/14/2015   Procedure: COLONOSCOPY;  Surgeon: Rogene Houston, MD;  [facility];  Service: Endoscopy;  Laterality: N/A;  1200  . ESOPHAGEAL DILATION N/A 04/14/2018   Procedure: ESOPHAGEAL DILATION;  Surgeon: Rogene Houston, MD;  [facility];  Service: Endoscopy;  Laterality: N/A;  . ESOPHAGOGASTRODUODENOSCOPY N/A 04/14/2018   Procedure: ESOPHAGOGASTRODUODENOSCOPY (EGD);  Surgeon: Rogene Houston, MD;  [facility];  Service: Endoscopy;  Laterality: N/A;  3:10  . PORT-A-CATH REMOVAL Right 03/29/2019   Procedure: REMOVAL PORT-A-CATH;  Surgeon: Rolm Bookbinder, MD;  Location: [place];  Service: General;  Laterality: Right;  .  PORTACATH PLACEMENT Right 11/30/2018   Procedure: INSERTION PORT-A-CATH WITH ULTRASOUND;  Surgeon: Rolm Bookbinder, MD;  Location: [place];  Service: General;  Laterality: Right;  . RETINAL TEAR REPAIR CRYOTHERAPY    . THYROIDECTOMY      Current Outpatient Medications  Medication Sig Dispense Refill  . atorvastatin (LIPITOR) 20 MG tablet Take 20 mg by mouth every Tuesday. Takes in the evening on Tuesdays  only    . Cholecalciferol (VITAMIN D) 50 MCG (2000 UT) CAPS Take 2,000 Units by mouth daily.    Marland Kitchen ezetimibe (ZETIA) 10 MG tablet Take 10 mg by mouth at bedtime.    Marland Kitchen levothyroxine (SYNTHROID) 75 MCG tablet Take 75 mcg by mouth daily before breakfast.    . Methylcellulose, Laxative, (CITRUCEL) 500 MG TABS Take 500 mg by mouth daily as needed (constipation). As needed per patient.    Marland Kitchen telmisartan (MICARDIS) 40 MG tablet Take 40 mg by mouth daily.     No current facility-administered medications for this visit.   Allergies:  Tuna oil [fish oil]   ROS: Intermittent palpitations, no syncope.  Physical Exam: VS:  BP 118/62   Pulse 78   Ht 5' 6.5" (1.689 m)   Wt 162 lb (73.5 kg)   SpO2 97%   BMI 25.76 kg/m , BMI Body mass index is 25.76 kg/m.  Wt Readings from Last 3 Encounters:  07/27/20 162 lb (73.5 kg)  07/26/20 165 lb (74.8 kg)  06/25/20 161 lb 9.6 oz (73.3 kg)    General: Patient appears comfortable at rest. HEENT: Conjunctiva and lids normal, wearing a mask. Neck: Supple, no elevated JVP or carotid bruits, no thyromegaly. Lungs: Clear to auscultation, nonlabored breathing at rest. Cardiac: Regular rate and rhythm, no S3, soft basal systolic murmur, no pericardial rub. Abdomen: Soft, nontender, bowel sounds present. Extremities: Spider veins left lower leg greater than right lower leg, distal pulses 2+.  ECG:  An ECG dated March 2020 was personally reviewed today and demonstrated:  Sinus rhythm with nonspecific ST changes.  Recent Labwork: 07/03/2020: Creatinine, Ser 1.08 July 2020: BUN 29, creatinine 1.06, potassium 4.6, AST 21, ALT 02 July 2018: Cholesterol 189, triglycerides 127, HDL 56, LDL 108  Other Studies Reviewed Today:  CT abdomen and pelvis 07/03/2020: IMPRESSION: 1. No definitive findings to suggest metastatic disease in the chest, abdomen or pelvis. 2. However, there is a somewhat ill-defined hypovascular area in the superior aspect of the head of  the pancreas. This is concerning for potential pancreatic mass and warrants further evaluation with follow-up nonemergent abdominal MRI with and without IV gadolinium with MRCP in the near future to exclude underlying neoplasm. 3. Old granulomatous disease, as above. 4. Aortic atherosclerosis, in addition to 2 vessel coronary artery disease. Assessment for potential risk factor modification, dietary therapy or pharmacologic therapy may be warranted, if clinically indicated. 5. There are calcifications of the aortic valve. Echocardiographic correlation for evaluation of potential valvular dysfunction may be warranted if clinically indicated. 6. Additional incidental findings, as above.  Echocardiogram 07/18/2020: 1. Left ventricular ejection fraction, by estimation, is 60 to 65%. The  left ventricle has normal function. The left ventricle has no regional  wall motion abnormalities. Left ventricular diastolic parameters are  consistent with Grade I diastolic  dysfunction (impaired relaxation).  2. Right ventricular systolic function is normal. The right ventricular  size is normal.  3. The mitral valve is normal in structure. No evidence of mitral valve  regurgitation. No evidence of mitral stenosis.  4. The  aortic valve is tricuspid. Aortic valve regurgitation is not  visualized. No aortic stenosis is present.  5. The inferior vena cava is normal in size with greater than 50%  respiratory variability, suggesting right atrial pressure of 3 mmHg.  Assessment and Plan:  1.  Two-vessel coronary artery calcification by CT imaging, also aortic atherosclerosis.  She has a history of hypertension and family history of ischemic heart disease.  Baseline ECG normal.  She recalls attempting a treadmill test several years ago, but could not tolerate it due to fatigue.  She has not had any recent ischemic testing.  Echocardiogram reveals LVEF 60 to 65% with mild diastolic dysfunction.  Plan is to  proceed with a Lexiscan Myoview for formal ischemic evaluation, otherwise plan medical therapy for risk reduction.  2.  Calcified aortic valve by CT imaging, no evidence of stenosis however by echocardiography.  She has a soft systolic murmur.  3.  Hyperlipidemia, LDL over 100 back in 2020.  She is on once weekly Lipitor along with Zetia daily.  Requesting most recent lipid panel from Dr. Willey Blade.  Medication Adjustments/Labs and Tests Ordered: Current medicines are reviewed at length with the patient today.  Concerns regarding medicines are outlined above.   Tests Ordered: Orders Placed This Encounter  Procedures  . NM Myocar Multi W/Spect W/Wall Motion / EF  . EKG 12-Lead    Medication Changes: No orders of the defined types were placed in this encounter.   Disposition:  Follow up test results and if low risk anticipate yearly follow-up.  Signed, Satira Sark, MD, [facility] 07/27/2020 3:22 PM    [facility] [address], [place], [place] [address] Phone: [phone]; Fax: [phone]

## 2020-07-26 NOTE — Progress Notes (Signed)
Maylon Peppers, M.D. Gastroenterology & Hepatology [facility] [address] [place], [place] [address]  Primary Care Physician: [name], MD [address] [place] [address]  I will communicate my assessment and recommendations to the referring MD via EMR.  Problems: 1. Gastric wall thickening  History of Present Illness: Carol Byrd is a 75 y.o. female with past medical history of breast cancer status post chemotherapy, hypertension, GERD, hypothyroidism, hyperlipidemia, who presents for evaluation of abnormal findings on MRI.  Patient reports she has been asymptomatic denies having any complaints.As part of the surveillance of her breast cancer, the patient underwent a CT chest, abdomen and pelvis from 07/03/2020 which showed presence of hypervascular area in the pancreas suspicious for mass measuring 3 x 1.8 cm without presence of ductal dilation in the hepatobiliary tract and pancreatic duct.  This was followed by an MRCP which was performed on 07/19/2020 which was negative for any pancreatic lesion but showed persistence of a broad region of gastric mucosal thickening in the fundus and body of the stomach.  She was recommended to have endoscopic evaluation for this.  The patient denies having any nausea, vomiting, fever, chills, hematochezia, melena, hematemesis, abdominal distention, abdominal pain, diarrhea, jaundice, pruritus or weight loss. Her weight has been stable since she had chemotherapy.  Has not taken her Protonix for the last 8 months as she has not had any heartburn or other complaints.  Last EGD: 04/14/18 - Esophageal polyp(s) were found. Biopsied. - 2 cm hiatal hernia. - No endoscopic esophageal abnormality to explain patient's dysphagia. Esophagus dilated. - Normal duodenal bulb and second portion of the duodenum. Last Colonoscopy: 2016 - Prep satisfactory. Scattered diverticula throughout  the colon but most of these were located in the region of hepatic flexure. Normal rectal mucosa. Small hemorrhoids below the dentate line.  Past Medical History: Past Medical History:  Diagnosis Date  . Alopecia   . Breast cancer ([place])   . Cervical disc disease   . Essential hypertension   . Family history of breast cancer   . Family history of lung cancer   . Family history of melanoma   . Family history of multiple myeloma   . Family history of stomach cancer   . GERD (gastroesophageal reflux disease)   . Hypothyroidism   . Impaired fasting glucose   . Mixed hyperlipidemia   . Personal history of chemotherapy   . Personal history of radiation therapy   . Vitamin D deficiency     Past Surgical History: Past Surgical History:  Procedure Laterality Date  . BACK SURGERY  1985   bulging lumbar disk  . BIOPSY  04/14/2018   Procedure: BIOPSY;  Surgeon: Rogene Houston, MD;  [facility];  Service: Endoscopy;;  esophagus  . BREAST LUMPECTOMY Left 03/2019  . BREAST LUMPECTOMY WITH RADIOACTIVE SEED AND SENTINEL LYMPH NODE BIOPSY Left 03/29/2019   Procedure: LEFT BREAST LUMPECTOMY WITH RADIOACTIVE SEED AND LEFT AXILLARY SENTINEL LYMPH NODE BIOPSY BLUE DYE INJECTION;  Surgeon: Rolm Bookbinder, MD;  Location: [place];  Service: General;  Laterality: Left;  . cataracts    . COLONOSCOPY N/A 02/14/2015   Procedure: COLONOSCOPY;  Surgeon: Rogene Houston, MD;  [facility];  Service: Endoscopy;  Laterality: N/A;  1200  . ESOPHAGEAL DILATION N/A 04/14/2018   Procedure: ESOPHAGEAL DILATION;  Surgeon: Rogene Houston, MD;  [facility];  Service: Endoscopy;  Laterality: N/A;  .  ESOPHAGOGASTRODUODENOSCOPY N/A 04/14/2018   Procedure: ESOPHAGOGASTRODUODENOSCOPY (EGD);  Surgeon: Rogene Houston, MD;  [facility];  Service: Endoscopy;  Laterality: N/A;  3:10  . PORT-A-CATH REMOVAL Right 03/29/2019   Procedure: REMOVAL  PORT-A-CATH;  Surgeon: Rolm Bookbinder, MD;  Location: [place];  Service: General;  Laterality: Right;  . PORTACATH PLACEMENT Right 11/30/2018   Procedure: INSERTION PORT-A-CATH WITH ULTRASOUND;  Surgeon: Rolm Bookbinder, MD;  Location: [place];  Service: General;  Laterality: Right;  . RETINAL TEAR REPAIR CRYOTHERAPY    . THYROIDECTOMY      Family History: Family History  Problem Relation Age of Onset  . Depression Mother   . Hypertension Mother   . Heart attack Father   . Heart disease Other   . Multiple myeloma Cousin 79       maternal first cousin  . Breast cancer Sister 101       second breast cancer diagnosed at 79  . Breast cancer Maternal Aunt 80  . Cancer Maternal Uncle        unknown  . Stomach cancer Maternal Grandfather 80  . Lung cancer Maternal Uncle 58  . Lung cancer Maternal Uncle 84  . Lung cancer Cousin        maternal first cousins  . Cancer Cousin        unknown, paternal first cousins  . Colon cancer Neg Hx     Social History: Social History   Tobacco Use  Smoking Status Former Smoker  . Types: Cigarettes  . Quit date: 04/14/1994  . Years since quitting: 26.3  Smokeless Tobacco Never Used   Social History   Substance and Sexual Activity  Alcohol Use Not Currently  . Alcohol/week: 2.0 standard drinks  . Types: 2 Glasses of wine per week   Comment: wine but not recently   Social History   Substance and Sexual Activity  Drug Use No    Allergies: Allergies  Allergen Reactions  . Alpha-Gal Anaphylaxis, Itching and Swelling  . Tuna Oil [Fish Oil] Diarrhea    Medications: Current Outpatient Medications  Medication Sig Dispense Refill  . atorvastatin (LIPITOR) 20 MG tablet Take 20 mg by mouth See admin instructions. Takes in the evening on Tuesdays only    . cholecalciferol (VITAMIN D3) 25 MCG (1000 UT) tablet Take 2,000 Units by mouth daily.    Marland Kitchen ezetimibe (ZETIA) 10 MG tablet Take 10 mg by mouth at bedtime.    Marland Kitchen  levothyroxine (SYNTHROID) 75 MCG tablet Take 75 mcg by mouth daily.    . Methylcellulose, Laxative, (CITRUCEL) 500 MG TABS Take 500 mg by mouth daily.    Vladimir Faster Glycol-Propyl Glycol (SYSTANE) 0.4-0.3 % SOLN Place 1 drop into both eyes at bedtime as needed (dry eyes).    Marland Kitchen telmisartan (MICARDIS) 40 MG tablet Take 40 mg by mouth daily.     No current facility-administered medications for this visit.    Review of Systems: GENERAL: negative for malaise, night sweats HEENT: No changes in hearing or vision, no nose bleeds or other nasal problems. NECK: Negative for lumps, goiter, pain and significant neck swelling RESPIRATORY: Negative for cough, wheezing CARDIOVASCULAR: Negative for chest pain, leg swelling, palpitations, orthopnea GI: SEE HPI MUSCULOSKELETAL: Negative for joint pain or swelling, back pain, and muscle pain. SKIN: Negative for lesions, rash PSYCH: Negative for sleep disturbance, mood disorder and recent psychosocial stressors. HEMATOLOGY Negative for prolonged bleeding, bruising easily, and swollen nodes. ENDOCRINE: Negative for cold or heat intolerance, polyuria,  polydipsia and goiter. NEURO: negative for tremor, gait imbalance, syncope and seizures. The remainder of the review of systems is noncontributory.   Physical Exam: BP (!) 156/76 (BP Location: Left Arm, Patient Position: Sitting, Cuff Size: Large)   Pulse 97   Temp 98.6 F (37 C) (Oral)   Ht 5' 6"  (1.676 m)   Wt 165 lb (74.8 kg)   BMI 26.63 kg/m  GENERAL: The patient is AO x3, in no acute distress. HEENT: Head is normocephalic and atraumatic. EOMI are intact. Mouth is well hydrated and without lesions. NECK: Supple. No masses LUNGS: Clear to auscultation. No presence of rhonchi/wheezing/rales. Adequate chest expansion HEART: RRR, normal s1 and s2. ABDOMEN: Soft, nontender, no guarding, no peritoneal signs, and nondistended. BS +. No masses. EXTREMITIES: Without any cyanosis, clubbing, rash, lesions or  edema. NEUROLOGIC: AOx3, no focal motor deficit. SKIN: no jaundice, no rashes  Imaging/Labs: as above  I personally reviewed and interpreted the available labs, imaging and endoscopic files.  Impression and Plan: Caidyn Blossom is a 75 y.o. female with past medical history of breast cancer status post chemotherapy, hypertension, GERD, hypothyroidism, hyperlipidemia, who presents for evaluation of abnormal findings on MRI.  Has been completely asymptomatic and was found to have incidental thickening of the gastric wall on recent abdominal imaging.  I considered these alterations could be an artifact due to peristalsis but given history of breast cancer and high concern by the patient, we will proceed with an EGD to rule out any intraluminal or mucosal pathology to explain these alterations.  Patient understood and agreed.  - Schedule EGD  All questions were answered.      Harvel Quale, MD Gastroenterology and Hepatology [facility]

## 2020-07-27 ENCOUNTER — Encounter: Payer: Self-pay | Admitting: Cardiology

## 2020-07-27 ENCOUNTER — Ambulatory Visit: Payer: Medicare Other | Admitting: Cardiology

## 2020-07-27 ENCOUNTER — Encounter: Payer: Self-pay | Admitting: *Deleted

## 2020-07-27 VITALS — BP 118/62 | HR 78 | Ht 66.5 in | Wt 162.0 lb

## 2020-07-27 DIAGNOSIS — E782 Mixed hyperlipidemia: Secondary | ICD-10-CM

## 2020-07-27 DIAGNOSIS — I2510 Atherosclerotic heart disease of native coronary artery without angina pectoris: Secondary | ICD-10-CM

## 2020-07-27 DIAGNOSIS — I358 Other nonrheumatic aortic valve disorders: Secondary | ICD-10-CM | POA: Diagnosis not present

## 2020-07-27 DIAGNOSIS — I10 Essential (primary) hypertension: Secondary | ICD-10-CM | POA: Diagnosis not present

## 2020-07-27 NOTE — Patient Instructions (Signed)
Medication Instructions:  Your physician recommends that you continue on your current medications as directed. Please refer to the Current Medication list given to you today.  *If you need a refill on your cardiac medications before your next appointment, please call your pharmacy*   Lab Work: NONE   If you have labs (blood work) drawn today and your tests are completely normal, you will receive your results only by: Marland Kitchen MyChart Message (if you have MyChart) OR . A paper copy in the mail If you have any lab test that is abnormal or we need to change your treatment, we will call you to review the results.   Testing/Procedures: Your physician has requested that you have a lexiscan myoview. For further information please visit [email or web address]. Please follow instruction sheet, as given.  Follow-Up: At [facility], you and your health needs are our priority.  As part of our continuing mission to provide you with exceptional heart care, we have created designated Provider Care Teams.  These Care Teams include your primary Cardiologist (physician) and Advanced Practice Providers (APPs -  Physician Assistants and Nurse Practitioners) who all work together to provide you with the care you need, when you need it.  We recommend signing up for the patient portal called "MyChart".  Sign up information is provided on this After Visit Summary.  MyChart is used to connect with patients for Virtual Visits (Telemedicine).  Patients are able to view lab/test results, encounter notes, upcoming appointments, etc.  Non-urgent messages can be sent to your provider as well.   To learn more about what you can do with MyChart, go to [email or web address].    Your next appointment:   1 year(s)  The format for your next appointment:   In Person  Provider:   Rozann Lesches, MD   Other Instructions Thank you for choosing [facility]!

## 2020-07-29 IMAGING — US US BREAST*L* LIMITED INC AXILLA
1 series · 8 of 8 positions shown · non-contrast
Comparison: Previous exam(s).

CLINICAL DATA: 74-year-old female presenting for follow-up of an
infected seroma at the patient's lumpectomy site. She had lumpectomy
for malignancy in Wednesday March, 2019. She has had surgical incision
and drainage on 06/27/2019 and was treated with a course of Bactrim,
without significant improvement. She was then placed on doxycycline
07/05/2019 followed by ultrasound-guided aspiration on 07/06/2019
yielding staphylococcal is epidermidis. She was given another course
of doxycycline 07/20/2019, and finished those antibiotics this past
weekend. She states that the tenderness, warmth and redness has
improved, though the redness still persists and has been present
since surgery.

EXAM:
ULTRASOUND OF THE LEFT BREAST

[Series 1: us breast*left* limited inc axilla · 0.07mm/px · 8 of 8 slices shown]
[im 1/8]
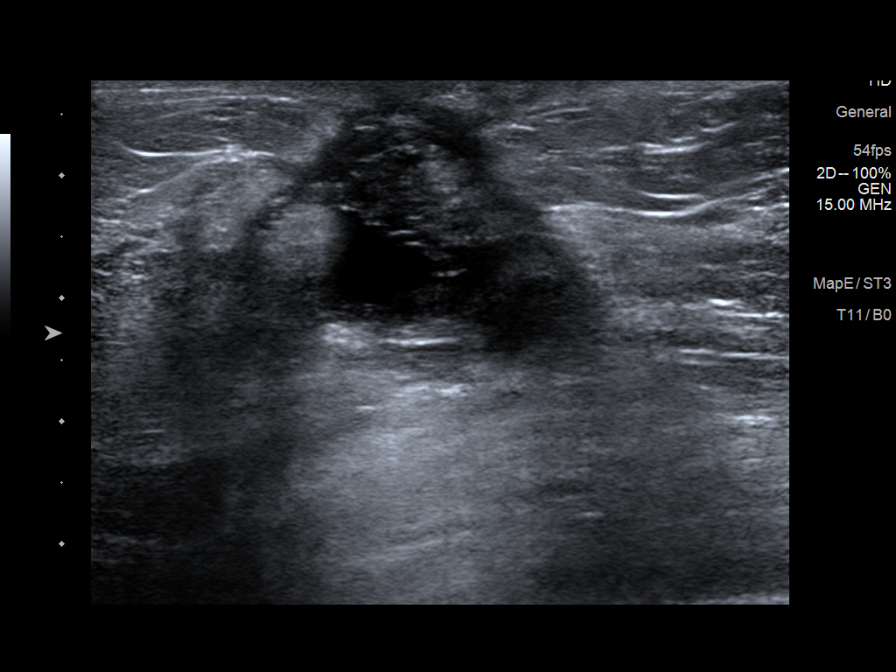
[im 2/8]
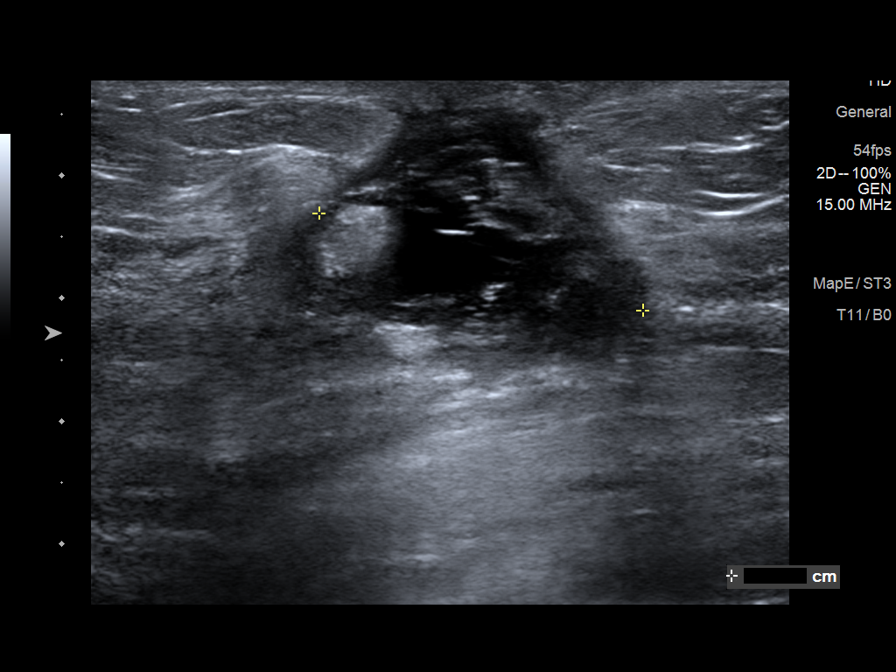
[im 3/8]
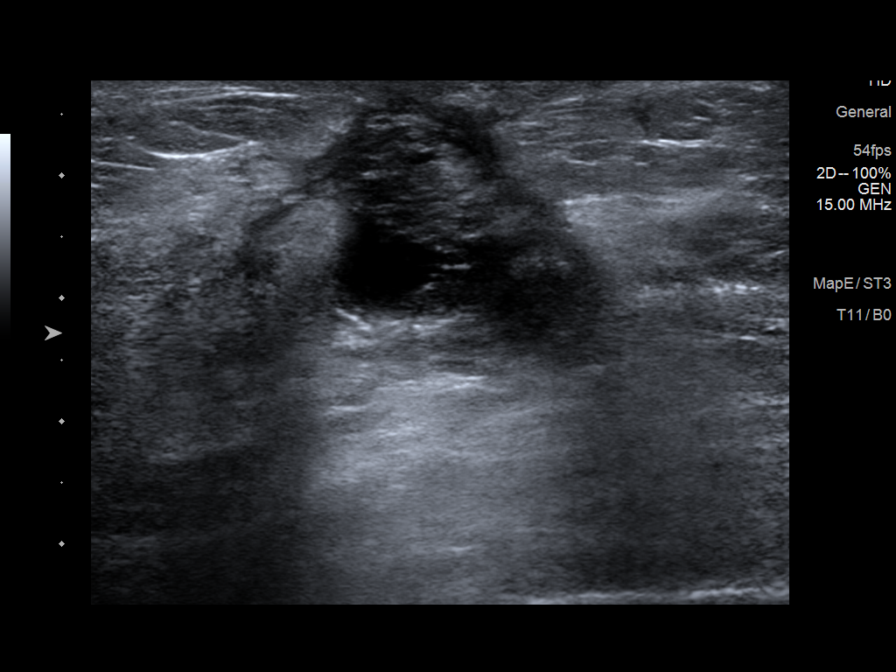
[im 4/8]
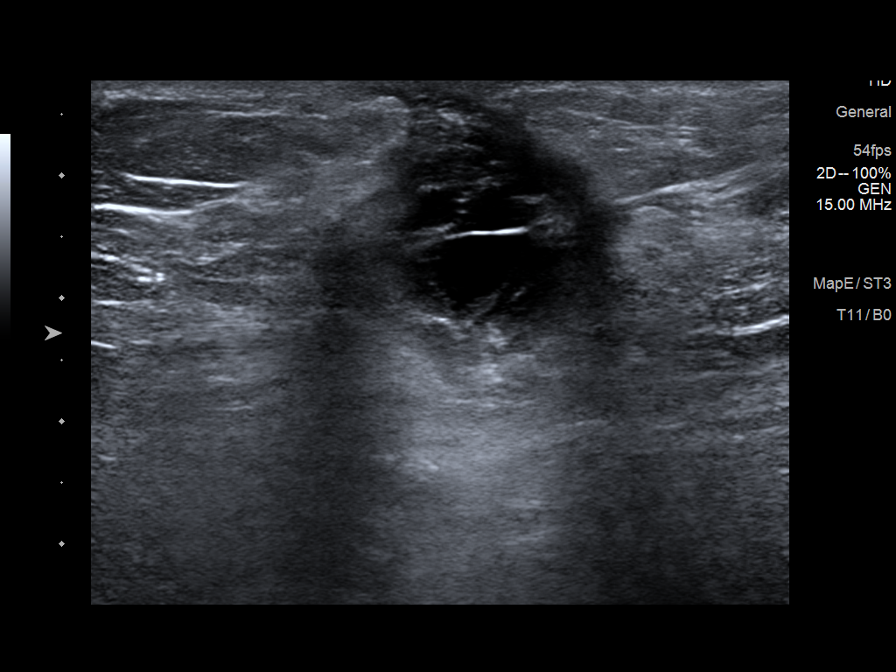
[im 5/8]
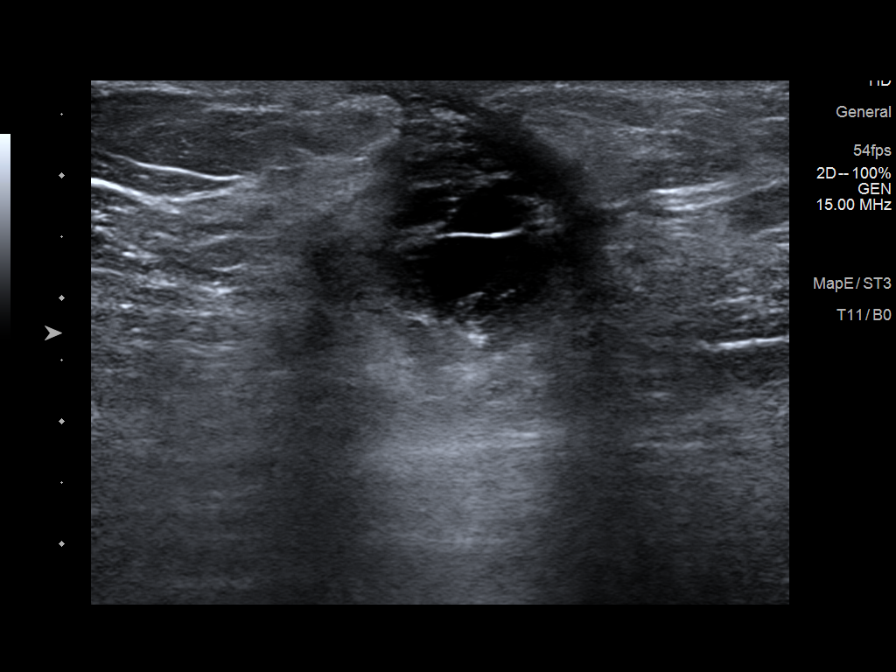
[im 6/8]
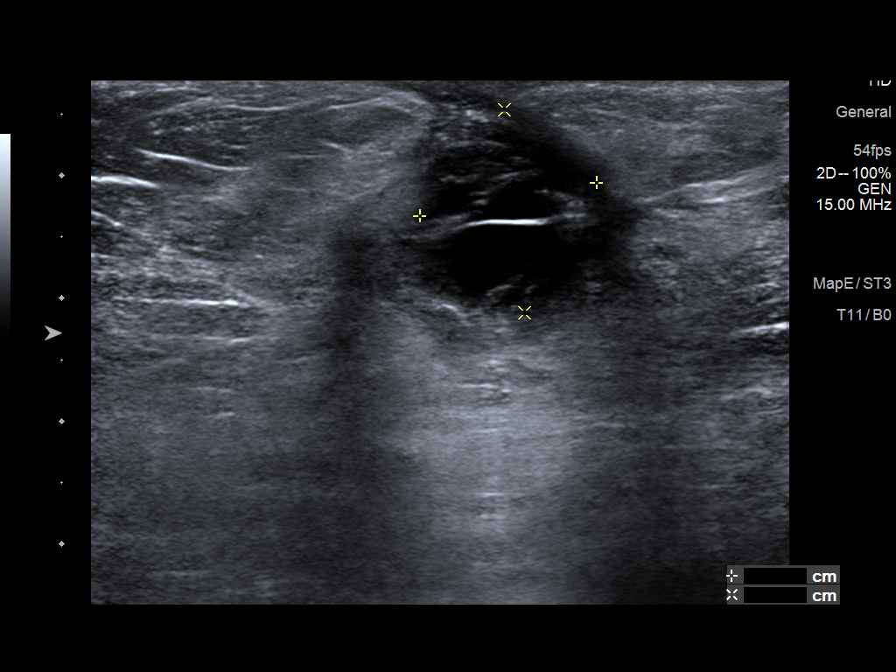
[im 7/8]
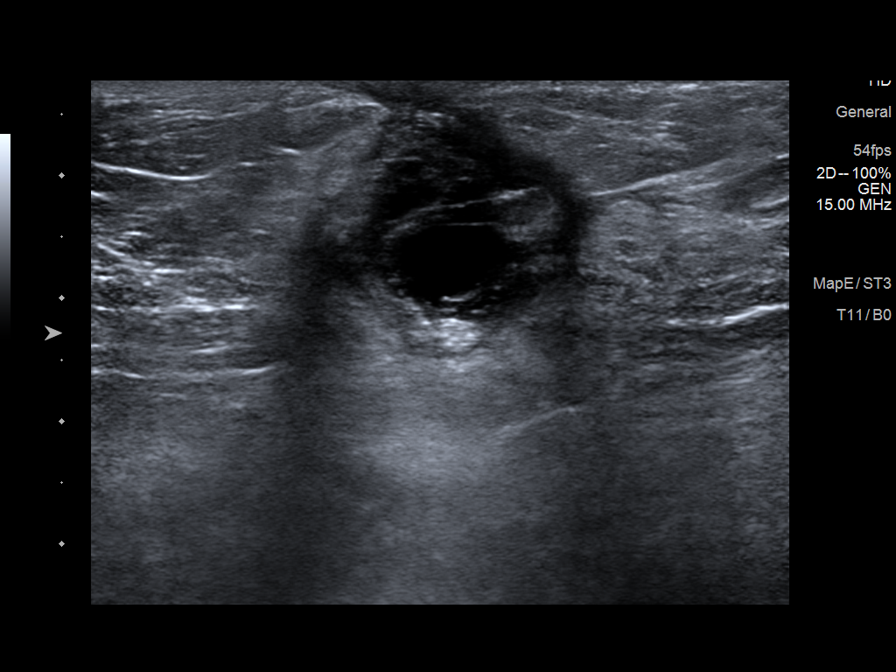
[im 8/8]
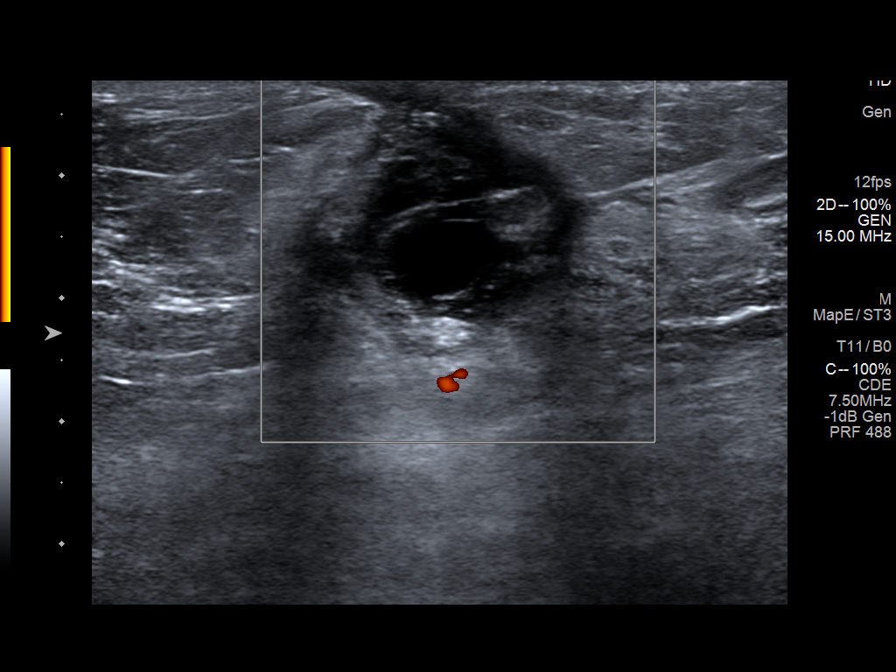

[8 of 8 positions shown; findings below may reference images not displayed]

FINDINGS: On physical exam, there is a firm palpable lump with overlying
erythema at the lumpectomy site in the upper-outer left breast. No
abnormal warmth is palpated.

Ultrasound of the left breast at 2 o'clock, 9 cm from the nipple
demonstrates that the complex fluid collection at the lumpectomy
site has decreased in size. The entire hypoechoic area measures
x 1.5 x 1.7 cm, previously 3.1 x 1.8 x 2.2 cm. The fluid pockets
within the lumpectomy bed appear more simple than on the prior
study.
IMPRESSION: Decrease in size of the lumpectomy seroma. The patient's symptoms
have significantly improved and she has not had a flare up of the
symptoms since stopping her antibiotics over the weekend.

RECOMMENDATION:
1. The patient has follow-up with Dr. Beytocan this [REDACTED]. She was
advised that if the erythema, pain, warmth or any other infectious
symptoms arise before her follow-up she should contact Dr. Beytocan
immediately.

2. Bilateral diagnostic mammogram for routine annual surveillance of
the lumpectomy site is due in Friday October, 2019.

I have discussed the findings and recommendations with the patient.
If applicable, a reminder letter will be sent to the patient
regarding the next appointment.

BI-RADS CATEGORY  2: Benign.

## 2020-07-30 ENCOUNTER — Telehealth: Payer: Self-pay | Admitting: *Deleted

## 2020-07-30 MED ORDER — ATORVASTATIN CALCIUM 20 MG PO TABS: 20.0000 mg | ORAL_TABLET | Freq: Every day | ORAL | 3 refills | Status: DC

## 2020-07-30 NOTE — Telephone Encounter (Signed)
Pt notified and voiced that she will now take Lipitor daily as suggested. Order placed to [facility].

## 2020-07-30 NOTE — Telephone Encounter (Signed)
-----   Message from Satira Sark, MD sent at 07/27/2020  4:49 PM EST ----- Results reviewed.  Lab work from Dr. Willey Blade dated November 2021 showed normal LFTs, total cholesterol 229 with LDL 142.  I would suggest that she increase her Lipitor to daily dosing for now.

## 2020-08-06 ENCOUNTER — Other Ambulatory Visit (HOSPITAL_COMMUNITY)
Admission: RE | Admit: 2020-08-06 | Discharge: 2020-08-06 | Disposition: A | Discharge status: Home / Self Care | Payer: Medicare Other | Source: Ambulatory Visit | Attending: Gastroenterology | Admitting: Gastroenterology

## 2020-08-06 ENCOUNTER — Other Ambulatory Visit: Payer: Self-pay

## 2020-08-06 DIAGNOSIS — Z20822 Contact with and (suspected) exposure to covid-19: Secondary | ICD-10-CM | POA: Diagnosis not present

## 2020-08-06 DIAGNOSIS — Z01812 Encounter for preprocedural laboratory examination: Secondary | ICD-10-CM | POA: Insufficient documentation

## 2020-08-06 LAB — SARS CORONAVIRUS 2 (TAT 6-24 HRS): SARS Coronavirus 2: NEGATIVE

## 2020-08-07 ENCOUNTER — Ambulatory Visit (HOSPITAL_COMMUNITY): Payer: Medicare Other | Admitting: Certified Registered Nurse Anesthetist

## 2020-08-07 ENCOUNTER — Ambulatory Visit (HOSPITAL_COMMUNITY)
Admission: RE | Admit: 2020-08-07 | Discharge: 2020-08-07 | Disposition: A | Discharge status: Home / Self Care | Payer: Medicare Other | Attending: Gastroenterology | Admitting: Gastroenterology

## 2020-08-07 ENCOUNTER — Encounter (HOSPITAL_COMMUNITY)
Admission: RE | Disposition: A | Discharge status: Home / Self Care | Payer: Self-pay | Source: Home / Self Care | Attending: Gastroenterology

## 2020-08-07 ENCOUNTER — Other Ambulatory Visit: Payer: Self-pay

## 2020-08-07 ENCOUNTER — Encounter (HOSPITAL_COMMUNITY): Payer: Self-pay | Admitting: Gastroenterology

## 2020-08-07 DIAGNOSIS — Z809 Family history of malignant neoplasm, unspecified: Secondary | ICD-10-CM | POA: Diagnosis not present

## 2020-08-07 DIAGNOSIS — Z853 Personal history of malignant neoplasm of breast: Secondary | ICD-10-CM | POA: Diagnosis not present

## 2020-08-07 DIAGNOSIS — E890 Postprocedural hypothyroidism: Secondary | ICD-10-CM | POA: Diagnosis not present

## 2020-08-07 DIAGNOSIS — Z803 Family history of malignant neoplasm of breast: Secondary | ICD-10-CM | POA: Diagnosis not present

## 2020-08-07 DIAGNOSIS — Z800 Family history of malignant neoplasm of digestive organs: Secondary | ICD-10-CM | POA: Insufficient documentation

## 2020-08-07 DIAGNOSIS — Z8249 Family history of ischemic heart disease and other diseases of the circulatory system: Secondary | ICD-10-CM | POA: Diagnosis not present

## 2020-08-07 DIAGNOSIS — R933 Abnormal findings on diagnostic imaging of other parts of digestive tract: Secondary | ICD-10-CM

## 2020-08-07 DIAGNOSIS — Z801 Family history of malignant neoplasm of trachea, bronchus and lung: Secondary | ICD-10-CM | POA: Diagnosis not present

## 2020-08-07 DIAGNOSIS — Z807 Family history of other malignant neoplasms of lymphoid, hematopoietic and related tissues: Secondary | ICD-10-CM | POA: Diagnosis not present

## 2020-08-07 DIAGNOSIS — Z87891 Personal history of nicotine dependence: Secondary | ICD-10-CM | POA: Diagnosis not present

## 2020-08-07 DIAGNOSIS — Z9221 Personal history of antineoplastic chemotherapy: Secondary | ICD-10-CM | POA: Insufficient documentation

## 2020-08-07 DIAGNOSIS — K2289 Other specified disease of esophagus: Secondary | ICD-10-CM

## 2020-08-07 DIAGNOSIS — Z79899 Other long term (current) drug therapy: Secondary | ICD-10-CM | POA: Diagnosis not present

## 2020-08-07 DIAGNOSIS — E782 Mixed hyperlipidemia: Secondary | ICD-10-CM | POA: Diagnosis not present

## 2020-08-07 DIAGNOSIS — Z923 Personal history of irradiation: Secondary | ICD-10-CM | POA: Insufficient documentation

## 2020-08-07 DIAGNOSIS — Z79890 Hormone replacement therapy (postmenopausal): Secondary | ICD-10-CM | POA: Insufficient documentation

## 2020-08-07 DIAGNOSIS — I10 Essential (primary) hypertension: Secondary | ICD-10-CM | POA: Diagnosis not present

## 2020-08-07 HISTORY — PX: ESOPHAGOGASTRODUODENOSCOPY (EGD) WITH PROPOFOL: SHX5813

## 2020-08-07 SURGERY — ESOPHAGOGASTRODUODENOSCOPY (EGD) WITH PROPOFOL
Anesthesia: General

## 2020-08-07 MED ORDER — LACTATED RINGERS IV SOLN: INTRAVENOUS | Status: DC | PRN

## 2020-08-07 MED ORDER — PROPOFOL 500 MG/50ML IV EMUL
INTRAVENOUS | Status: DC | PRN
  Administered 2020-08-07: 200 ug/kg/min via INTRAVENOUS

## 2020-08-07 MED ORDER — PROPOFOL 10 MG/ML IV BOLUS
INTRAVENOUS | Status: DC | PRN
  Administered 2020-08-07: 70 mg via INTRAVENOUS
  Administered 2020-08-07: 30 mg via INTRAVENOUS

## 2020-08-07 MED ORDER — LIDOCAINE HCL (CARDIAC) PF 100 MG/5ML IV SOSY
PREFILLED_SYRINGE | INTRAVENOUS | Status: DC | PRN
  Administered 2020-08-07: 50 mg via INTRAVENOUS

## 2020-08-07 NOTE — Op Note (Addendum)
[facility] Patient Name: Carol Byrd Procedure Date: 08/07/2020 11:40 AM MRN: [id number] Date of Birth: 1946/01/06 Attending MD: Maylon Peppers ,  CSN: [id number] Age: 75 Admit Type: Outpatient Procedure:                Upper GI endoscopy Indications:              Abnormal CT of the GI tract Providers:                Maylon Peppers, Rosina Lowenstein, RN, Raphael Gibney,                            Technician Referring MD:              Medicines:                Monitored Anesthesia Care Complications:            No immediate complications. Estimated Blood Loss:     Estimated blood loss: none. Procedure:                Pre-Anesthesia Assessment:                           - Prior to the procedure, a History and Physical                            was performed, and patient medications, allergies                            and sensitivities were reviewed. The patient's                            tolerance of previous anesthesia was reviewed.                           - The risks and benefits of the procedure and the                            sedation options and risks were discussed with the                            patient. All questions were answered and informed                            consent was obtained.                           - ASA Grade Assessment: III - A patient with severe                            systemic disease.                           After obtaining informed consent, the endoscope was                            passed under direct vision. Throughout the  procedure, the patient's blood pressure, pulse, and                            oxygen saturations were monitored continuously. The                            [id number]-H190 ([id number]) scope was introduced through the                            mouth, and advanced to the second part of duodenum.                            The upper GI endoscopy was accomplished without                             difficulty. The patient tolerated the procedure                            well. Scope In: 11:43:15 AM Scope Out: 11:50:56 AM Total Procedure Duration: 0 hours 7 minutes 41 seconds  Findings:      The Z-line was irregular.      The examined esophagus was normal.      The entire examined stomach was normal.      The examined duodenum was normal. Impression:               - Z-line irregular.                           - Normal esophagus.                           - Normal stomach.                           - Normal examined duodenum.                           - No specimens collected. Moderate Sedation:      Per Anesthesia Care Recommendation:           - Discharge patient to home (ambulatory).                           - Resume previous diet. Procedure Code(s):        --- Professional ---                           [phone], Esophagogastroduodenoscopy, flexible,                            transoral; diagnostic, including collection of                            specimen(s) by brushing or washing, when performed                            (separate procedure) Diagnosis Code(s):        ---  Professional ---                           K22.8, Other specified diseases of esophagus                           R93.3, Abnormal findings on diagnostic imaging of                            other parts of digestive tract CPT copyright 2019 [employer]. All rights reserved. The codes documented in this report are preliminary and upon coder review may  be revised to meet current compliance requirements. Maylon Peppers, MD Maylon Peppers,  08/07/2020 11:54:38 AM This report has been signed electronically. Number of Addenda: 0

## 2020-08-07 NOTE — Interval H&P Note (Signed)
History and Physical Interval Note:  08/07/2020 10:47 AM Carol Byrd is a 75 y.o. female with past medical history of breast cancer status post chemotherapy, hypertension, GERD, hypothyroidism, hyperlipidemia, who presents for evaluation of abnormal findings on MRI.  The patient was found to have gastric mucosal thickening in the fundus and body of the stomach.  She reports she has been asymptomatic but is concerned about the gastric findings on her imaging. The patient denies having any nausea, vomiting, fever, chills, hematochezia, melena, hematemesis, abdominal distention, abdominal pain, diarrhea, jaundice, pruritus  Last EGD: 04/14/18 - Esophageal polyp(s) were found. Biopsied. - 2 cm hiatal hernia. - No endoscopic esophageal abnormality to explain patient's dysphagia. Esophagus dilated. - Normal duodenal bulb and second portion of the duodenum.  BP (!) 138/58   Pulse 65   Temp 98 F (36.7 C) (Oral)   Resp 16   SpO2 98%  GENERAL: The patient is AO x3, in no acute distress. HEENT: Head is normocephalic and atraumatic. EOMI are intact. Mouth is well hydrated and without lesions. NECK: Supple. No masses LUNGS: Clear to auscultation. No presence of rhonchi/wheezing/rales. Adequate chest expansion HEART: RRR, normal s1 and s2. ABDOMEN: Soft, nontender, no guarding, no peritoneal signs, and nondistended. BS +. No masses. EXTREMITIES: Without any cyanosis, clubbing, rash, lesions or edema. NEUROLOGIC: AOx3, no focal motor deficit. SKIN: no jaundice, no rashes   Carol Byrd  has presented today for surgery, with the diagnosis of Gastric thickening, abnormal CT.  The various methods of treatment have been discussed with the patient and family. After consideration of risks, benefits and other options for treatment, the patient has consented to  Procedure(s) with comments: ESOPHAGOGASTRODUODENOSCOPY (EGD) WITH PROPOFOL (N/A) - AM as a surgical intervention.  The  patient's history has been reviewed, patient examined, no change in status, stable for surgery.  I have reviewed the patient's chart and labs.  Questions were answered to the patient's satisfaction.     Carol Byrd

## 2020-08-07 NOTE — Discharge Instructions (Signed)
You are being discharged to home.  Resume your previous diet.   PATIENT INSTRUCTIONS POST-ANESTHESIA  IMMEDIATELY FOLLOWING SURGERY:  Do not drive or operate machinery for the first twenty four hours after surgery.  Do not make any important decisions for twenty four hours after surgery or while taking narcotic pain medications or sedatives.  If you develop intractable nausea and vomiting or a severe headache please notify your doctor immediately.  FOLLOW-UP:  Please make an appointment with your surgeon as instructed. You do not need to follow up with anesthesia unless specifically instructed to do so.  WOUND CARE INSTRUCTIONS (if applicable):  Keep a dry clean dressing on the anesthesia/puncture wound site if there is drainage.  Once the wound has quit draining you may leave it open to air.  Generally you should leave the bandage intact for twenty four hours unless there is drainage.  If the epidural site drains for more than 36-48 hours please call the anesthesia department.  QUESTIONS?:  Please feel free to call your physician or the hospital operator if you have any questions, and they will be happy to assist you.      Upper Endoscopy, Adult, Care After This sheet gives you information about how to care for yourself after your procedure. Your health care provider may also give you more specific instructions. If you have problems or questions, contact your health care provider. What can I expect after the procedure? After the procedure, it is common to have:  A sore throat.  Mild stomach pain or discomfort.  Bloating.  Nausea. Follow these instructions at home:  Follow instructions from your health care provider about what to eat or drink after your procedure.  Return to your normal activities as told by your health care provider. Ask your health care provider what activities are safe for you.  Take over-the-counter and prescription medicines only as told by your health care  provider.  If you were given a sedative during the procedure, it can affect you for several hours. Do not drive or operate machinery until your health care provider says that it is safe.  Keep all follow-up visits as told by your health care provider. This is important.   Contact a health care provider if you have:  A sore throat that lasts longer than one day.  Trouble swallowing. Get help right away if:  You vomit blood or your vomit looks like coffee grounds.  You have: ? A fever. ? Bloody, black, or tarry stools. ? A severe sore throat or you cannot swallow. ? Difficulty breathing. ? Severe pain in your chest or abdomen. Summary  After the procedure, it is common to have a sore throat, mild stomach discomfort, bloating, and nausea.  If you were given a sedative during the procedure, it can affect you for several hours. Do not drive or operate machinery until your health care provider says that it is safe.  Follow instructions from your health care provider about what to eat or drink after your procedure.  Return to your normal activities as told by your health care provider. This information is not intended to replace advice given to you by your health care provider. Make sure you discuss any questions you have with your health care provider. Document Revised: 05/03/2019 Document Reviewed: 10/05/2017 Elsevier Patient Education  2021 [employer].

## 2020-08-07 NOTE — Anesthesia Postprocedure Evaluation (Signed)
Anesthesia Post Note  Patient: Carol Byrd  Procedure(s) Performed: ESOPHAGOGASTRODUODENOSCOPY (EGD) WITH PROPOFOL (N/A )  Patient location during evaluation: Endoscopy Anesthesia Type: General Level of consciousness: awake and alert Pain management: pain level controlled Vital Signs Assessment: post-procedure vital signs reviewed and stable Respiratory status: spontaneous breathing, nonlabored ventilation, respiratory function stable and patient connected to nasal cannula oxygen Cardiovascular status: blood pressure returned to baseline and stable Postop Assessment: no apparent nausea or vomiting Anesthetic complications: no   No complications documented.   Last Vitals:  Vitals:   08/07/20 1021 08/07/20 1154  BP: (!) 138/58 (!) 95/51  Pulse: 65 71  Resp: 16 (!) 9  Temp: 36.7 C 36.7 C  SpO2: 98% 95%    Last Pain:  Vitals:   08/07/20 1154  TempSrc: Oral  PainSc: 0-No pain                 Adair Patter Mal Asher

## 2020-08-07 NOTE — Transfer of Care (Signed)
Immediate Anesthesia Transfer of Care Note  Patient: Carol Byrd  Procedure(s) Performed: ESOPHAGOGASTRODUODENOSCOPY (EGD) WITH PROPOFOL (N/A )  Patient Location: PACU and Endoscopy Unit  Anesthesia Type:General  Level of Consciousness: awake, alert  and oriented  Airway & Oxygen Therapy: Patient Spontanous Breathing  Post-op Assessment: Report given to RN, Post -op Vital signs reviewed and stable and Patient moving all extremities X 4  Post vital signs: Reviewed and stable  Last Vitals:  Vitals Value Taken Time  BP    Temp    Pulse    Resp    SpO2      Last Pain:  Vitals:   08/07/20 1154  TempSrc: Oral  PainSc: 0-No pain         Complications: No complications documented.

## 2020-08-07 NOTE — Anesthesia Preprocedure Evaluation (Signed)
Anesthesia Evaluation  Patient identified by MRN, date of birth, ID band Patient awake    Reviewed: Allergy & Precautions, H&P , NPO status , Patient's Chart, lab work & pertinent test results, reviewed documented beta blocker date and time   Airway Mallampati: II  TM Distance: >3 FB Neck ROM: full    Dental no notable dental hx.    Pulmonary neg pulmonary ROS, former smoker,    Pulmonary exam normal breath sounds clear to auscultation       Cardiovascular Exercise Tolerance: Good hypertension, negative cardio ROS   Rhythm:regular Rate:Normal     Neuro/Psych negative neurological ROS  negative psych ROS   GI/Hepatic Neg liver ROS, GERD  Medicated,  Endo/Other  Hypothyroidism   Renal/GU negative Renal ROS  negative genitourinary   Musculoskeletal   Abdominal   Peds  Hematology negative hematology ROS (+)   Anesthesia Other Findings 1. Left ventricular ejection fraction, by estimation, is 60 to 65%. The left ventricle has normal function. The left ventricle has no regional wall motion abnormalities. Left ventricular diastolic parameters are consistent with Grade I diastolic dysfunction (impaired relaxation). 2. Right ventricular systolic function is normal. The right ventricular size is normal. 3. The mitral valve is normal in structure. No evidence of mitral valve regurgitation. No evidence of mitral stenosis. 4. The aortic valve is tricuspid. Aortic valve regurgitation is not visualized. No aortic stenosis is present. 5. The inferior vena cava is normal in size with greater than 50% respiratory variability, suggesting right atrial pressure of 3 mmHg.  Reproductive/Obstetrics negative OB ROS                             Anesthesia Physical Anesthesia Plan  ASA: II  Anesthesia Plan: General   Post-op Pain Management:    Induction:   PONV Risk Score and Plan: Propofol  infusion  Airway Management Planned:   Additional Equipment:   Intra-op Plan:   Post-operative Plan:   Informed Consent: I have reviewed the patients History and Physical, chart, labs and discussed the procedure including the risks, benefits and alternatives for the proposed anesthesia with the patient or authorized representative who has indicated his/her understanding and acceptance.     Dental Advisory Given  Plan Discussed with: CRNA  Anesthesia Plan Comments:         Anesthesia Quick Evaluation

## 2020-08-08 DIAGNOSIS — Z853 Personal history of malignant neoplasm of breast: Secondary | ICD-10-CM | POA: Diagnosis not present

## 2020-08-08 DIAGNOSIS — T83722A Exposure of implanted urethral mesh into urethra, initial encounter: Secondary | ICD-10-CM | POA: Diagnosis not present

## 2020-08-09 ENCOUNTER — Encounter (HOSPITAL_COMMUNITY)
Admission: RE | Admit: 2020-08-09 | Discharge: 2020-08-09 | Disposition: A | Discharge status: Home / Self Care | Payer: Medicare Other | Source: Ambulatory Visit | Attending: Cardiology | Admitting: Cardiology

## 2020-08-09 ENCOUNTER — Encounter (HOSPITAL_COMMUNITY): Payer: Self-pay

## 2020-08-09 ENCOUNTER — Encounter (HOSPITAL_BASED_OUTPATIENT_CLINIC_OR_DEPARTMENT_OTHER)
Admission: RE | Admit: 2020-08-09 | Discharge: 2020-08-09 | Disposition: A | Discharge status: Home / Self Care | Payer: Medicare Other | Source: Ambulatory Visit | Attending: Cardiology | Admitting: Cardiology

## 2020-08-09 DIAGNOSIS — I2510 Atherosclerotic heart disease of native coronary artery without angina pectoris: Secondary | ICD-10-CM | POA: Insufficient documentation

## 2020-08-09 LAB — NM MYOCAR MULTI W/SPECT W/WALL MOTION / EF
LV dias vol: 54 mL (ref 46–106)
LV sys vol: 14 mL
Peak HR: 104 {beats}/min
RATE: 0.75
Rest HR: 64 {beats}/min
SDS: 4
SRS: 0
SSS: 4
TID: 1.15

## 2020-08-09 MED ORDER — SODIUM CHLORIDE FLUSH 0.9 % IV SOLN
INTRAVENOUS | Status: AC
  Administered 2020-08-09: 10 mL via INTRAVENOUS
  Filled 2020-08-09: qty 10

## 2020-08-09 MED ORDER — TECHNETIUM TC 99M TETROFOSMIN IV KIT
30.0000 | PACK | Freq: Once | INTRAVENOUS | Status: AC | PRN
  Administered 2020-08-09: 31.6 via INTRAVENOUS

## 2020-08-09 MED ORDER — TECHNETIUM TC 99M TETROFOSMIN IV KIT
10.0000 | PACK | Freq: Once | INTRAVENOUS | Status: AC | PRN
  Administered 2020-08-09: 10.7 via INTRAVENOUS

## 2020-08-09 MED ORDER — REGADENOSON 0.4 MG/5ML IV SOLN
INTRAVENOUS | Status: AC
  Administered 2020-08-09: 0.4 mg via INTRAVENOUS
  Filled 2020-08-09: qty 5

## 2020-08-15 ENCOUNTER — Encounter (HOSPITAL_COMMUNITY): Payer: Self-pay | Admitting: Gastroenterology

## 2020-08-23 ENCOUNTER — Other Ambulatory Visit: Payer: Self-pay | Admitting: Internal Medicine

## 2020-08-23 DIAGNOSIS — R413 Other amnesia: Secondary | ICD-10-CM

## 2020-08-23 DIAGNOSIS — I10 Essential (primary) hypertension: Secondary | ICD-10-CM | POA: Diagnosis not present

## 2020-09-05 ENCOUNTER — Encounter (HOSPITAL_COMMUNITY): Payer: Self-pay

## 2020-09-05 ENCOUNTER — Ambulatory Visit (HOSPITAL_COMMUNITY): Payer: Medicare Other

## 2020-09-07 DIAGNOSIS — R413 Other amnesia: Secondary | ICD-10-CM | POA: Diagnosis not present

## 2020-09-11 DIAGNOSIS — R413 Other amnesia: Secondary | ICD-10-CM | POA: Diagnosis not present

## 2020-09-15 DIAGNOSIS — E038 Other specified hypothyroidism: Secondary | ICD-10-CM | POA: Diagnosis not present

## 2020-09-15 DIAGNOSIS — I10 Essential (primary) hypertension: Secondary | ICD-10-CM | POA: Diagnosis not present

## 2020-09-15 DIAGNOSIS — E785 Hyperlipidemia, unspecified: Secondary | ICD-10-CM | POA: Diagnosis not present

## 2020-09-20 ENCOUNTER — Ambulatory Visit (HOSPITAL_COMMUNITY)
Admission: RE | Admit: 2020-09-20 | Discharge: 2020-09-20 | Disposition: A | Discharge status: Home / Self Care | Payer: Medicare Other | Source: Ambulatory Visit | Attending: Internal Medicine | Admitting: Internal Medicine

## 2020-09-20 DIAGNOSIS — R413 Other amnesia: Secondary | ICD-10-CM | POA: Insufficient documentation

## 2020-09-20 MED ORDER — GADOBUTROL 1 MMOL/ML IV SOLN
7.0000 mL | Freq: Once | INTRAVENOUS | Status: AC | PRN
  Administered 2020-09-20: 7 mL via INTRAVENOUS

## 2020-09-27 ENCOUNTER — Other Ambulatory Visit: Payer: Self-pay | Admitting: *Deleted

## 2020-09-27 ENCOUNTER — Encounter: Payer: Self-pay | Admitting: Hematology and Oncology

## 2020-09-27 DIAGNOSIS — Z171 Estrogen receptor negative status [ER-]: Secondary | ICD-10-CM

## 2020-09-27 DIAGNOSIS — C50412 Malignant neoplasm of upper-outer quadrant of left female breast: Secondary | ICD-10-CM

## 2020-10-23 DIAGNOSIS — Z961 Presence of intraocular lens: Secondary | ICD-10-CM | POA: Diagnosis not present

## 2020-10-23 DIAGNOSIS — H524 Presbyopia: Secondary | ICD-10-CM | POA: Diagnosis not present

## 2020-10-23 DIAGNOSIS — H35372 Puckering of macula, left eye: Secondary | ICD-10-CM | POA: Diagnosis not present

## 2020-10-24 DIAGNOSIS — R3989 Other symptoms and signs involving the genitourinary system: Secondary | ICD-10-CM | POA: Insufficient documentation

## 2020-10-24 DIAGNOSIS — N393 Stress incontinence (female) (male): Secondary | ICD-10-CM | POA: Insufficient documentation

## 2020-11-15 DIAGNOSIS — E785 Hyperlipidemia, unspecified: Secondary | ICD-10-CM | POA: Diagnosis not present

## 2020-11-15 DIAGNOSIS — I10 Essential (primary) hypertension: Secondary | ICD-10-CM | POA: Diagnosis not present

## 2020-11-15 DIAGNOSIS — E038 Other specified hypothyroidism: Secondary | ICD-10-CM | POA: Diagnosis not present

## 2020-11-23 ENCOUNTER — Inpatient Hospital Stay: Admission: RE | Admit: 2020-11-23 | Payer: Medicare Other | Source: Ambulatory Visit

## 2020-11-24 ENCOUNTER — Encounter: Payer: Self-pay | Admitting: Hematology and Oncology

## 2020-11-27 DIAGNOSIS — D1801 Hemangioma of skin and subcutaneous tissue: Secondary | ICD-10-CM | POA: Diagnosis not present

## 2020-11-27 DIAGNOSIS — L658 Other specified nonscarring hair loss: Secondary | ICD-10-CM | POA: Diagnosis not present

## 2020-11-27 DIAGNOSIS — L720 Epidermal cyst: Secondary | ICD-10-CM | POA: Diagnosis not present

## 2020-11-27 DIAGNOSIS — L918 Other hypertrophic disorders of the skin: Secondary | ICD-10-CM | POA: Diagnosis not present

## 2020-11-27 DIAGNOSIS — L821 Other seborrheic keratosis: Secondary | ICD-10-CM | POA: Diagnosis not present

## 2020-12-04 DIAGNOSIS — R413 Other amnesia: Secondary | ICD-10-CM | POA: Diagnosis not present

## 2020-12-07 ENCOUNTER — Ambulatory Visit
Admission: RE | Admit: 2020-12-07 | Discharge: 2020-12-07 | Disposition: A | Discharge status: Home / Self Care | Payer: Medicare Other | Source: Ambulatory Visit | Attending: Hematology and Oncology | Admitting: Hematology and Oncology

## 2020-12-07 ENCOUNTER — Other Ambulatory Visit: Payer: Self-pay

## 2020-12-07 DIAGNOSIS — R922 Inconclusive mammogram: Secondary | ICD-10-CM | POA: Diagnosis not present

## 2020-12-07 DIAGNOSIS — C50412 Malignant neoplasm of upper-outer quadrant of left female breast: Secondary | ICD-10-CM

## 2020-12-07 DIAGNOSIS — Z171 Estrogen receptor negative status [ER-]: Secondary | ICD-10-CM

## 2020-12-11 DIAGNOSIS — I10 Essential (primary) hypertension: Secondary | ICD-10-CM | POA: Diagnosis not present

## 2021-03-01 DIAGNOSIS — Z23 Encounter for immunization: Secondary | ICD-10-CM | POA: Diagnosis not present

## 2021-03-19 ENCOUNTER — Other Ambulatory Visit (HOSPITAL_COMMUNITY): Payer: Self-pay | Admitting: Internal Medicine

## 2021-03-19 ENCOUNTER — Other Ambulatory Visit: Payer: Self-pay

## 2021-03-19 ENCOUNTER — Ambulatory Visit (HOSPITAL_COMMUNITY)
Admission: RE | Admit: 2021-03-19 | Discharge: 2021-03-19 | Disposition: A | Discharge status: Home / Self Care | Payer: Medicare Other | Source: Ambulatory Visit | Attending: Internal Medicine | Admitting: Internal Medicine

## 2021-03-19 DIAGNOSIS — M7989 Other specified soft tissue disorders: Secondary | ICD-10-CM | POA: Diagnosis not present

## 2021-03-19 DIAGNOSIS — S60221A Contusion of right hand, initial encounter: Secondary | ICD-10-CM | POA: Diagnosis not present

## 2021-03-19 DIAGNOSIS — I10 Essential (primary) hypertension: Secondary | ICD-10-CM | POA: Diagnosis not present

## 2021-03-19 DIAGNOSIS — R431 Parosmia: Secondary | ICD-10-CM | POA: Diagnosis not present

## 2021-05-29 ENCOUNTER — Encounter: Payer: Self-pay | Admitting: Diagnostic Neuroimaging

## 2021-05-29 ENCOUNTER — Ambulatory Visit: Payer: Medicare Other | Admitting: Diagnostic Neuroimaging

## 2021-05-29 VITALS — BP 148/73 | HR 90 | Ht 67.0 in | Wt 161.6 lb

## 2021-05-29 DIAGNOSIS — R413 Other amnesia: Secondary | ICD-10-CM | POA: Diagnosis not present

## 2021-05-29 NOTE — Patient Instructions (Signed)
MILD MEMORY LOSS (possible mild ADHD and anxiety for many years; no sign of dementia) - mild memory issues; no changes in ADLs - safety / supervision issues reviewed - daily physical activity / exercise (at least 15-30 minutes) - eat more plants / vegetables - increase social activities, brain stimulation, games, puzzles, hobbies, crafts, arts, music - aim for at least 7-8 hours sleep per night (or more) - avoid smoking and alcohol - caregiver resources provided - caution with medications, finances, driving

## 2021-05-29 NOTE — Progress Notes (Signed)
[facility]  PATIENT: Carol Byrd DOB: Jun 16, 1945  REFERRING CLINICIAN: [name], MD HISTORY FROM: patient and husband REASON FOR VISIT: new consult   HISTORICAL  CHIEF COMPLAINT:  Chief Complaint  Patient presents with   Memory disturbance    Rm 7 New Pt   husbandCecilie Lowers  MMSE 30  "biggest issue is short term memory loss which creates anxiety for her"     HISTORY OF PRESENT ILLNESS:   76 year old female here for evaluation of memory loss.  Symptoms started on October 2020.  Around the time she was diagnosed with breast cancer and underwent chemotherapy.  She had some side effects with her second round.  Since that time symptoms have continued.  She feels some problems with attention, focus, short-term memory.  She is also been more socially isolated during the [place] pandemic.  She does extensive word puzzles, sudoku and other games, and does well on these.  Having some issues with anxiety and insomnia. Has had mild issues like this in the past, but more lately.    REVIEW OF SYSTEMS: Full 14 system review of systems performed and negative with exception of: as per HPI.  ALLERGIES: Allergies  Allergen Reactions   Carboplatin     Other reaction(s): Other (See Comments) Caused low blood counts requiring blood transfusion    Tuna Oil [Fish Oil] Diarrhea    HOME MEDICATIONS: Outpatient Medications Prior to Visit  Medication Sig Dispense Refill   atorvastatin (LIPITOR) 20 MG tablet Take 1 tablet (20 mg total) by mouth daily. 90 tablet 3   Cholecalciferol (VITAMIN D) 50 MCG (2000 UT) CAPS Take 2,000 Units by mouth daily.     escitalopram (LEXAPRO) 10 MG tablet Take 10 mg by mouth daily.     ezetimibe (ZETIA) 10 MG tablet Take 10 mg by mouth at bedtime.     HYDROcodone-Chlorpheniramine (CHLORPHENIRAMINE-HYDROCODONE PO) Take by mouth. 8-10 mg mL suspension     levothyroxine (SYNTHROID) 75 MCG tablet Take 75 mcg by mouth daily before breakfast.      telmisartan (MICARDIS) 40 MG tablet Take 40 mg by mouth daily.     Methylcellulose, Laxative, (CITRUCEL) 500 MG TABS Take 500 mg by mouth daily as needed (constipation). As needed per patient.     No facility-administered medications prior to visit.    PAST MEDICAL HISTORY: Past Medical History:  Diagnosis Date   Alopecia    Breast cancer ([place])    Left   Cervical disc disease    Essential hypertension    Family history of breast cancer    Family history of lung cancer    Family history of melanoma    Family history of multiple myeloma    Family history of stomach cancer    GERD (gastroesophageal reflux disease)    Hypothyroidism    Impaired fasting glucose    Mixed hyperlipidemia    Personal history of chemotherapy    Personal history of radiation therapy    Vitamin D deficiency     PAST SURGICAL HISTORY: Past Surgical History:  Procedure Laterality Date   BACK SURGERY  1985   bulging lumbar disk   BIOPSY  04/14/2018   Procedure: BIOPSY;  Surgeon: Rogene Houston, MD;  [facility];  Service: Endoscopy;;  esophagus   BREAST LUMPECTOMY Left 03/2019   BREAST LUMPECTOMY WITH RADIOACTIVE SEED AND SENTINEL LYMPH NODE BIOPSY Left 03/29/2019   Procedure: LEFT BREAST LUMPECTOMY WITH RADIOACTIVE SEED AND LEFT AXILLARY SENTINEL LYMPH NODE BIOPSY  BLUE DYE INJECTION;  Surgeon: Rolm Bookbinder, MD;  Location: [place];  Service: General;  Laterality: Left;   cataracts     COLONOSCOPY N/A 02/14/2015   Procedure: COLONOSCOPY;  Surgeon: Rogene Houston, MD;  [facility];  Service: Endoscopy;  Laterality: N/A;  1200   ESOPHAGEAL DILATION N/A 04/14/2018   Procedure: ESOPHAGEAL DILATION;  Surgeon: Rogene Houston, MD;  [facility];  Service: Endoscopy;  Laterality: N/A;   ESOPHAGOGASTRODUODENOSCOPY N/A 04/14/2018   Procedure: ESOPHAGOGASTRODUODENOSCOPY (EGD);  Surgeon: Rogene Houston, MD;  [facility];  Service:  Endoscopy;  Laterality: N/A;  3:10   ESOPHAGOGASTRODUODENOSCOPY (EGD) WITH PROPOFOL N/A 08/07/2020   Procedure: ESOPHAGOGASTRODUODENOSCOPY (EGD) WITH PROPOFOL;  Surgeon: Harvel Quale, MD;  [facility];  Service: Gastroenterology;  Laterality: N/A;  AM   PORT-A-CATH REMOVAL Right 03/29/2019   Procedure: REMOVAL PORT-A-CATH;  Surgeon: Rolm Bookbinder, MD;  Location: [place];  Service: General;  Laterality: Right;   PORTACATH PLACEMENT Right 11/30/2018   Procedure: INSERTION PORT-A-CATH WITH ULTRASOUND;  Surgeon: Rolm Bookbinder, MD;  Location: [place];  Service: General;  Laterality: Right;   RETINAL TEAR REPAIR CRYOTHERAPY     THYROIDECTOMY      FAMILY HISTORY: Family History  Problem Relation Age of Onset   Depression Mother    Hypertension Mother    Heart attack Father    Heart disease Other    Multiple myeloma Cousin 53       maternal first cousin   Breast cancer Sister 44       second breast cancer diagnosed at 84   Breast cancer Maternal Aunt 80   Cancer Maternal Uncle        unknown   Stomach cancer Maternal Grandfather 28   Lung cancer Maternal Uncle 64   Lung cancer Maternal Uncle 58   Lung cancer Cousin        maternal first cousins   Cancer Cousin        unknown, paternal first cousins   Colon cancer Neg Hx     SOCIAL HISTORY: Social History   Socioeconomic History   Marital status: Married    Spouse name: Cecilie Lowers   Number of children: Not on file   Years of education: 15   Highest education level: Associate degree: occupational, Hotel manager, or vocational program  Occupational History   Occupation: retired  Tobacco Use   Smoking status: Former    Types: Cigarettes    Quit date: 04/14/1994    Years since quitting: 27.1   Smokeless tobacco: Never  Vaping Use   Vaping Use: Never used  Substance and Sexual Activity   Alcohol use: Yes    Alcohol/week: 2.0 standard drinks    Types: 2 Glasses of wine per week     Comment: wine 2 a night   Drug use: No   Sexual activity: Not on file  Other Topics Concern   Not on file  Social History Narrative   05/29/21 lives with husband   Social Determinants of Health   Financial Resource Strain: Not on file  Food Insecurity: Not on file  Transportation Needs: Not on file  Physical Activity: Not on file  Stress: Not on file  Social Connections: Not on file  Intimate Partner Violence: Not on file     PHYSICAL EXAM  GENERAL EXAM/CONSTITUTIONAL: Vitals:  Vitals:   05/29/21 1309  BP: (!) 148/73  Pulse: 90  Weight: 161 lb 9.6  oz (73.3 kg)  Height: $Remove'5\' 7"'MYloMea$  (1.702 m)   Body mass index is 25.31 kg/m. Wt Readings from Last 3 Encounters:  05/29/21 161 lb 9.6 oz (73.3 kg)  07/27/20 162 lb (73.5 kg)  07/26/20 165 lb (74.8 kg)   Patient is in no distress; well developed, nourished and groomed; neck is supple  CARDIOVASCULAR: Examination of carotid arteries is normal; no carotid bruits Regular rate and rhythm, no murmurs Examination of peripheral vascular system by observation and palpation is normal  EYES: Ophthalmoscopic exam of optic discs and posterior segments is normal; no papilledema or hemorrhages No results found.  MUSCULOSKELETAL: Gait, strength, tone, movements noted in Neurologic exam below  NEUROLOGIC: MENTAL STATUS:  MMSE - Mini Mental State Exam 05/29/2021  Orientation to time 5  Orientation to Place 5  Registration 3  Attention/ Calculation 5  Recall 3  Language- name 2 objects 2  Language- repeat 1  Language- follow 3 step command 3  Language- read & follow direction 1  Write a sentence 1  Copy design 1  Total score 30   awake, alert, oriented to person, place and time recent and remote memory intact normal attention and concentration language fluent, comprehension intact, naming intact fund of knowledge appropriate  CRANIAL NERVE:  2nd - no papilledema on fundoscopic exam 2nd, 3rd, 4th, 6th - LEFT PUPIL POST  SURGICAL, NO REACTION 5MM; RIGHT PUPIL 3MM REACTIVE; visual fields full to confrontation, extraocular muscles intact, no nystagmus 5th - facial sensation symmetric 7th - facial strength symmetric 8th - hearing intact 9th - palate elevates symmetrically, uvula midline 11th - shoulder shrug symmetric 12th - tongue protrusion midline  MOTOR:  normal bulk and tone, full strength in the BUE, BLE  SENSORY:  normal and symmetric to light touch, temperature, vibration  COORDINATION:  finger-nose-finger, fine finger movements normal  REFLEXES:  deep tendon reflexes present and symmetric  GAIT/STATION:  narrow based gait     DIAGNOSTIC DATA (LABS, IMAGING, TESTING) - I reviewed patient records, labs, notes, testing and imaging myself where available.  Lab Results  Component Value Date   WBC 4.1 03/14/2019   HGB 9.9 (L) 03/14/2019   HCT 29.4 (L) 03/14/2019   MCV 106.9 (H) 03/14/2019   PLT 192 03/14/2019      Component Value Date/Time   NA 142 03/14/2019 0951   K 4.4 03/14/2019 0951   CL 105 03/14/2019 0951   CO2 24 03/14/2019 0951   GLUCOSE 85 03/14/2019 0951   BUN 27 (H) 03/14/2019 0951   CREATININE 1.20 (H) 07/03/2020 1241   CREATININE 1.19 (H) 03/14/2019 0951   CALCIUM 9.2 03/14/2019 0951   PROT 7.0 03/14/2019 0951   ALBUMIN 3.9 03/14/2019 0951   AST 16 03/14/2019 0951   ALT 14 03/14/2019 0951   ALKPHOS 57 03/14/2019 0951   BILITOT 0.5 03/14/2019 0951   GFRNONAA 45 (L) 03/14/2019 0951   GFRAA 52 (L) 03/14/2019 0951   No results found for: CHOL, HDL, LDLCALC, LDLDIRECT, TRIG, CHOLHDL No results found for: HGBA1C No results found for: VITAMINB12 No results found for: TSH  B12 479  TSH 1.69  09/20/20 MRI brain - No metastatic disease or acute intracranial abnormality. Essentially normal for age MRI appearance of the brain.    ASSESSMENT AND PLAN  76 y.o. year old female here with:   Dx:  1. Memory loss      PLAN:  MILD MEMORY LOSS (since Oct  2020; possible mild ADHD and anxiety for many years; no  sign of dementia at this time) - mild memory issues; no changes in ADLs - safety / supervision issues reviewed - daily physical activity / exercise (at least 15-30 minutes) - eat more plants / vegetables - increase social activities, brain stimulation, games, puzzles, hobbies, crafts, arts, music - aim for at least 7-8 hours sleep per night (or more) - avoid smoking and alcohol - caregiver resources provided - caution with medications, finances, driving  Return for return to PCP, pending if symptoms worsen or fail to improve.    Penni Bombard, MD 5/73/2202, 5:42 PM Certified in Neurology, Neurophysiology and Neuroimaging  [facility] [address], [place], [place] [address] [phone]

## 2021-06-04 ENCOUNTER — Encounter: Payer: Medicare Other | Admitting: Adult Health

## 2021-06-04 ENCOUNTER — Encounter: Payer: Self-pay | Admitting: Hematology and Oncology

## 2021-06-05 NOTE — Assessment & Plan Note (Addendum)
11/08/2018: Routine screening mammogram detected 2 masses in the left breast, measuring 1.6cm and 1.1cm, spanning a total of 3cm. Biopsy confirmed IDC with DCIS, HER-2 negative by FISH (2+ by IHC), ER/PR negative, Ki67 80%.  Staging: T2N0 stage IIb clinical stage Breast MRI: 11/18/2018: 2.9 cm bilobed mass UOQ left breast  Treatment plan 1.Neoadjuvant chemotherapy with dose dense Adriamycin and Cytoxan x4 followed by Taxol and carboplatincompleted 01/25/2019 2.05/28/18:Left lumpectomy Carol Byrd): scattered microscopic foci of high grade DCIS with no invasive carcinoma, clear margins, 2 left axillary lymph nodes negative. 2.Followed by adjuvant radiation12/15/2020-05/24/2019 -------------------------------------------------------------------------------------------------- Treatment plan:Surveillance  Carol Byrd is here today for urgent evaluation of left-sided breast changes and swelling.  The physical exam of her left breast is reassuring.  I do not feel anything that makes me overly concerned for breast cancer recurrence.  Due to the breast change however I did order left breast mammogram and ultrasound to further evaluate.  I also placed a referral for her to see physical therapy to help with massage of the breast to move fluid off if the swelling is related to lymphedema.  We will wait on the mammogram and ultrasound results and see how she does with physical therapy.  She does have breast density category C so with inconclusive and clinical concern remains we may need to consider breast MRI.  Carol Byrd has follow-up in March with Dr. Lindi Adie.  I recommended that she keep this appointment.

## 2021-06-05 NOTE — Progress Notes (Signed)
[facility] Follow up:    Carol Noble, MD [address] [place] [address]   DIAGNOSIS:  Cancer Staging  Malignant neoplasm of upper-outer quadrant of left breast in female, estrogen receptor negative ([place]) Staging form: Breast, AJCC 8th Edition - Clinical stage from 11/22/2018: Stage IIB (cT2, cN0, cM0, G3, ER-, PR-, HER2-) - Signed by Nicholas Lose, MD on 11/22/2018 Stage prefix: Initial diagnosis Histologic grading system: 3 grade system - Pathologic stage from 03/29/2019: No Stage Recommended (ypTis (DCIS), pN0, cM0) - Signed by Gardenia Phlegm, NP on 08/02/2019 Stage prefix: Post-therapy   SUMMARY OF ONCOLOGIC HISTORY: Oncology History  Malignant neoplasm of upper-outer quadrant of left breast in female, estrogen receptor negative ([place])  11/08/2018 Initial Diagnosis   Routine screening mammogram detected 2 masses in the left breast, measuring 1.6cm and 1.1cm, spanning a total of 3cm. Biopsy confirmed IDC with DCIS, HER-2 negative by FISH (2+ by IHC), ER/PR negative, Ki67 80%.    11/18/2018 Breast MRI   2.9 centimeter bilobed mass in the UOQ of the left breast, consistent with known malignancy. 0.5 centimeter satellite nodule is 0.5 centimeters below this mass.   11/22/2018 Cancer Staging   Staging form: Breast, AJCC 8th Edition - Clinical stage from 11/22/2018: Stage IIB (cT2, cN0, cM0, G3, ER-, PR-, HER2-)    11/27/2018 Genetic Testing   Patient had genetic testing done for a personal and family history of breast cancer.  Results were negative on the [facility] Breast cancer STAT gene panel. The STAT Breast cancer panel offered by [facility] includes sequencing and rearrangement analysis for the following 7 genes:  BRCA1, BRCA2, CDH1, PALB2, PTEN, STK11 and TP53.  The report date is 11/27/2018.   12/01/2018 - 01/25/2019 Chemotherapy   DOXOrubicin (ADRIAMYCIN) chemo injection 114 mg, 60 mg/m2 = 114 mg, Intravenous,  Once, 4 of 4 cycles. Dose modification:  50 mg/m2 (original dose 60 mg/m2, Cycle 2, Reason: Dose not tolerated), 40 mg/m2 (original dose 60 mg/m2, Cycle 4, Reason: Dose not tolerated). Administration: 114 mg (12/01/2018), 96 mg (12/15/2018), 96 mg (12/29/2018), 76 mg (01/12/2019)  palonosetron (ALOXI) injection 0.25 mg, 0.25 mg, Intravenous,  Once, 5 of 8 cycles. Administration: 0.25 mg (12/01/2018), 0.25 mg (01/25/2019), 0.25 mg (12/15/2018), 0.25 mg (12/29/2018), 0.25 mg (01/12/2019)  pegfilgrastim-jmdb (FULPHILA) injection 6 mg, 6 mg, Subcutaneous,  Once, 4 of 4 cycles. Administration: 6 mg (12/03/2018), 6 mg (12/17/2018), 6 mg (12/31/2018), 6 mg (01/14/2019)  CARBOplatin (PARAPLATIN) 510 mg in sodium chloride 0.9 % 250 mL chemo infusion, 510 mg (106.9 % of original dose 478.8 mg), Intravenous,  Once, 1 of 4 cycles. Dose modification: (original dose 478.8 mg, Cycle 5). Administration: 510 mg (01/25/2019)  cyclophosphamide (CYTOXAN) 1,140 mg in sodium chloride 0.9 % 250 mL chemo infusion, 600 mg/m2 = 1,140 mg, Intravenous,  Once, 4 of 4 cycles. Dose modification: 500 mg/m2 (original dose 600 mg/m2, Cycle 2, Reason: Dose not tolerated), 400 mg/m2 (original dose 600 mg/m2, Cycle 4, Reason: Dose not tolerated). Administration: 1,140 mg (12/01/2018), 960 mg (12/15/2018), 960 mg (12/29/2018), 760 mg (01/12/2019)  PACLitaxel (TAXOL) 150 mg in sodium chloride 0.9 % 250 mL chemo infusion (</= 79m/m2), 80 mg/m2 = 150 mg, Intravenous,  Once, 1 of 4 cycles Administration: 150 mg (01/25/2019)  fosaprepitant (EMEND) 150 mg   dexamethasone (DECADRON) 12 mg in sodium chloride 0.9 % 145 mL IVPB, , Intravenous,  Once, 5 of 8 cycles Administration:  (12/01/2018),  (01/25/2019),  (12/15/2018),  (12/29/2018),  (01/12/2019)   03/29/2019 Surgery  Left lumpectomy [name]) [phone]): scattered microscopic foci of high grade DCIS with no invasive carcinoma, clear margins, 2 left axillary lymph nodes negative.   03/29/2019 Cancer Staging   Staging form: Breast, AJCC 8th  Edition - Pathologic stage from 03/29/2019: No Stage Recommended (ypTis (DCIS), pN0, cM0)    05/03/2019 - 05/31/2019 Radiation Therapy   The patient initially received a dose of 40.05 Gy in 15 fractions to the breast using whole-breast tangent fields. This was delivered using a 3-D conformal technique. The pt received a boost delivering an additional 10 Gy in 5 fractions using a electron boost with 38mV electrons. The total dose was 50.05 Gy.      CURRENT THERAPY: Observation  INTERVAL HISTORY: Carol Byrd 76y.o. female returns for evaluation of left breast changes.  She has noted about a 6 month h/o left breast swelling and pain that is intermittent.  She also notes her left nipple is fading.  She denies nodules, erythema, tenderness, nipple discharge.  She has had no fevers or chills.   Her most recent mammogram on 12/07/2020 and showed no malignancy and breast density c.   Patient Active Problem List   Diagnosis Date Noted   Absence of bladder sensation 10/24/2020   SUI (stress urinary incontinence, female) 10/24/2020   Sensorineural hearing loss (SNHL) of both ears 02/14/2020   Genetic testing 11/27/2018   Family history of breast cancer    Family history of melanoma    Family history of lung cancer    Family history of stomach cancer    Family history of multiple myeloma    Malignant neoplasm of upper-outer quadrant of left breast in female, estrogen receptor negative ([place]    Varicose veins of bilateral lower extremities with other complications 146/50/3546   is allergic to carboplatin and tuna oil [fish oil].  MEDICAL HISTORY: Past Medical History:  Diagnosis Date   Abnormal CT scan, stomach 07/26/2020   Achilles tendonitis 10/04/2013   Alopecia    Breast cancer ([place]    Left   Cervical disc disease    Essential hypertension    Family history of breast cancer    Family history of lung cancer    Family history of melanoma    Family history of multiple  myeloma    Family history of stomach cancer    GERD (gastroesophageal reflux disease)    Hypothyroidism    Impaired fasting glucose    Metatarsal fracture 03/13/2011   Mixed hyperlipidemia    Personal history of chemotherapy    Personal history of radiation therapy    Port-A-Cath in place 12/01/2018   Vitamin D deficiency     SURGICAL HISTORY: Past Surgical History:  Procedure Laterality Date   BACK SURGERY  1985   bulging lumbar disk   BIOPSY  04/14/2018   Procedure: BIOPSY;  Surgeon: RRogene Houston MD;  [facility];  Service: Endoscopy;;  esophagus   BREAST LUMPECTOMY Left 03/2019   BREAST LUMPECTOMY WITH RADIOACTIVE SEED AND SENTINEL LYMPH NODE BIOPSY Left 03/29/2019   Procedure: LEFT BREAST LUMPECTOMY WITH RADIOACTIVE SEED AND LEFT AXILLARY SENTINEL LYMPH NODE BIOPSY BLUE DYE INJECTION;  Surgeon: WRolm Bookbinder MD;  [facility]  Service: General;  Laterality: Left;   cataracts     COLONOSCOPY N/A 02/14/2015   Procedure: COLONOSCOPY;  Surgeon: NRogene Houston MD;  [facility];  Service: Endoscopy;  Laterality: N/A;  1200   ESOPHAGEAL DILATION N/A 04/14/2018   Procedure:  ESOPHAGEAL DILATION;  Surgeon: Rogene Houston, MD;  [facility];  Service: Endoscopy;  Laterality: N/A;   ESOPHAGOGASTRODUODENOSCOPY N/A 04/14/2018   Procedure: ESOPHAGOGASTRODUODENOSCOPY (EGD);  Surgeon: Rogene Houston, MD;  [facility];  Service: Endoscopy;  Laterality: N/A;  3:10   ESOPHAGOGASTRODUODENOSCOPY (EGD) WITH PROPOFOL N/A 08/07/2020   Procedure: ESOPHAGOGASTRODUODENOSCOPY (EGD) WITH PROPOFOL;  Surgeon: Harvel Quale, MD;  [facility];  Service: Gastroenterology;  Laterality: N/A;  AM   PORT-A-CATH REMOVAL Right 03/29/2019   Procedure: REMOVAL PORT-A-CATH;  Surgeon: Rolm Bookbinder, MD;  Location: [facility];  Service: General;  Laterality: Right;   PORTACATH PLACEMENT Right  11/30/2018   Procedure: INSERTION PORT-A-CATH WITH ULTRASOUND;  Surgeon: Rolm Bookbinder, MD;  Location: [facility];  Service: General;  Laterality: Right;   RETINAL TEAR REPAIR CRYOTHERAPY     THYROIDECTOMY      SOCIAL HISTORY: Social History   Socioeconomic History   Marital status: Married    Spouse name: Cecilie Lowers   Number of children: Not on file   Years of education: 15   Highest education level: Associate degree: occupational, Hotel manager, or vocational program  Occupational History   Occupation: retired  Tobacco Use   Smoking status: Former    Types: Cigarettes    Quit date: 04/14/1994    Years since quitting: 27.1   Smokeless tobacco: Never  Vaping Use   Vaping Use: Never used  Substance and Sexual Activity   Alcohol use: Yes    Alcohol/week: 2.0 standard drinks    Types: 2 Glasses of wine per week    Comment: wine 2 a night   Drug use: No   Sexual activity: Not on file  Other Topics Concern   Not on file  Social History Narrative   05/29/21 lives with husband   Social Determinants of Health   Financial Resource Strain: Not on file  Food Insecurity: Not on file  Transportation Needs: Not on file  Physical Activity: Not on file  Stress: Not on file  Social Connections: Not on file  Intimate Partner Violence: Not on file    FAMILY HISTORY: Family History  Problem Relation Age of Onset   Depression Mother    Hypertension Mother    Heart attack Father    Heart disease Other    Multiple myeloma Cousin 2       maternal first cousin   Breast cancer Sister 51       second breast cancer diagnosed at 18   Breast cancer Maternal Aunt 80   Cancer Maternal Uncle        unknown   Stomach cancer Maternal Grandfather 55   Lung cancer Maternal Uncle 25   Lung cancer Maternal Uncle 51   Lung cancer Cousin        maternal first cousins   Cancer Cousin        unknown, paternal first cousins   Colon cancer Neg Hx     Review of Systems  Constitutional:  Negative for  appetite change, chills, fatigue, fever and unexpected weight change.  HENT:   Negative for hearing loss, lump/mass and trouble swallowing.   Eyes:  Negative for eye problems and icterus.  Respiratory:  Negative for chest tightness, cough and shortness of breath.   Cardiovascular:  Negative for chest pain, leg swelling and palpitations.  Gastrointestinal:  Negative for abdominal distention, abdominal pain, constipation, diarrhea, nausea and vomiting.  Endocrine: Negative for hot flashes.  Genitourinary:  Negative  for difficulty urinating.   Musculoskeletal:  Negative for arthralgias.  Skin:  Negative for itching and rash.  Neurological:  Negative for dizziness, extremity weakness, headaches and numbness.  Hematological:  Negative for adenopathy. Does not bruise/bleed easily.  Psychiatric/Behavioral:  Negative for depression. The patient is not nervous/anxious.      PHYSICAL EXAMINATION  ECOG PERFORMANCE STATUS: 1 - Symptomatic but completely ambulatory  Vitals:   06/06/21 0920  BP: 122/62  Pulse: 64  Resp: 16  Temp: 97.9 F (36.6 C)  SpO2: 95%    Physical Exam Constitutional:      General: She is not in acute distress.    Appearance: Normal appearance. She is not toxic-appearing.  HENT:     Head: Normocephalic and atraumatic.  Eyes:     General: No scleral icterus. Cardiovascular:     Rate and Rhythm: Normal rate and regular rhythm.     Pulses: Normal pulses.     Heart sounds: Normal heart sounds.  Pulmonary:     Effort: Pulmonary effort is normal.     Breath sounds: Normal breath sounds.  Chest:     Comments: Left breast status post lumpectomy and radiation there is some mild swelling in the left upper anterior breast.  Right breast is benign.  No axillary adenopathy noted Abdominal:     General: Abdomen is flat. Bowel sounds are normal. There is no distension.     Palpations: Abdomen is soft.     Tenderness: There is no abdominal tenderness.  Musculoskeletal:         General: No swelling.     Cervical back: Neck supple.  Lymphadenopathy:     Cervical: No cervical adenopathy.  Skin:    General: Skin is warm and dry.     Findings: No rash.  Neurological:     General: No focal deficit present.     Mental Status: She is alert.  Psychiatric:        Mood and Affect: Mood normal.        Behavior: Behavior normal.   No lab testing was obtained today.  ASSESSMENT and THERAPY PLAN:   Malignant neoplasm of upper-outer quadrant of left breast in female, estrogen receptor negative ([place]) 11/08/2018: Routine screening mammogram detected 2 masses in the left breast, measuring 1.6cm and 1.1cm, spanning a total of 3cm. Biopsy confirmed IDC with DCIS, HER-2 negative by FISH (2+ by IHC), ER/PR negative, Ki67 80%.  Staging: T2N0 stage IIb clinical stage Breast MRI: 11/18/2018: 2.9 cm bilobed mass UOQ left breast   Treatment plan 1.  Neoadjuvant chemotherapy with dose dense Adriamycin and Cytoxan x4 followed by Taxol and carboplatin completed 01/25/2019 2. 05/28/18: Left lumpectomy [name]): scattered microscopic foci of high grade DCIS with no invasive carcinoma, clear margins, 2 left axillary lymph nodes negative. 2.  Followed by adjuvant radiation 05/03/2019-05/24/2019 -------------------------------------------------------------------------------------------------- Treatment plan: Surveillance   Carol Byrd is here today for urgent evaluation of left-sided breast changes and swelling.  The physical exam of her left breast is reassuring.  I do not feel anything that makes me overly concerned for breast cancer recurrence.  Due to the breast change however I did order left breast mammogram and ultrasound to further evaluate.  I also placed a referral for her to see physical therapy to help with massage of the breast to move fluid off if the swelling is related to lymphedema.  We will wait on the mammogram and ultrasound results and see how she does with physical therapy.  She  does  have breast density category C so with inconclusive and clinical concern remains we may need to consider breast MRI.  Carol Byrd has follow-up in March with Dr. Lindi Adie.  I recommended that she keep this appointment.   Orders Placed This Encounter  Procedures   MM DIAG BREAST TOMO UNI LEFT    INS:[facility]  PREV:12/07/2020 @[facility]   YES SURGERY HX OF LUMPECTOMY / NO IMPLANTS OR REDUCTION/ YES BREAST CANCER// NO NEEDS AJ SW PORSHA @[facility]  [facility]  Epic ORDER     Standing Status:   Future    Standing Expiration Date:   06/06/2022    Order Specific Question:   Reason for Exam (SYMPTOM  OR DIAGNOSIS REQUIRED)    Answer:   h/o breast cancer, new breast cancer swelling, evaluate    Order Specific Question:   Preferred imaging location?    Answer:   [facility]   US BREAST LTD UNI LEFT INC AXILLA    INS:[facility]  PREV:12/07/2020 @[facility]   YES SURGERY HX OF LUMPECTOMY / NO IMPLANTS OR REDUCTION/ YES BREAST CANCER// NO NEEDS AJ SW PORSHA @[facility]  Epic ORDER     Standing Status:   Future    Standing Expiration Date:   06/06/2022    Order Specific Question:   Reason for Exam (SYMPTOM  OR DIAGNOSIS REQUIRED)    Answer:   h/o breast cancer, new breast cancer swelling, evaluate    Order Specific Question:   Preferred imaging location?    Answer:   [facility]   Ambulatory referral to Physical Therapy    Referral Priority:   Routine    Referral Type:   Physical Medicine    Referral Reason:   Specialty Services Required    Requested Specialty:   Physical Therapy    Number of Visits Requested:   1    All questions were answered. The patient knows to call the clinic with any problems, questions or concerns. We can certainly see the patient much sooner if necessary.  Total encounter time: 20 minutes in face-to-face visit time, chart review, lab review, care coordination, order entry, and documentation of the encounter.  Wilber Bihari, NP 06/06/21 1:13 PM Medical Oncology and Hematology [facility] [place], [place] [address] Tel. [phone]    Fax. [phone]  *Total Encounter Time as defined by the Centers for Medicare and Medicaid Services includes, in addition to the face-to-face time of a patient visit (documented in the note above) non-face-to-face time: obtaining and reviewing outside history, ordering and reviewing medications, tests or procedures, care coordination (communications with other health care professionals or caregivers) and documentation in the medical record.

## 2021-06-06 ENCOUNTER — Encounter: Payer: Self-pay | Admitting: Adult Health

## 2021-06-06 ENCOUNTER — Inpatient Hospital Stay: Payer: Medicare Other | Attending: Adult Health | Admitting: Adult Health

## 2021-06-06 ENCOUNTER — Other Ambulatory Visit: Payer: Self-pay

## 2021-06-06 VITALS — BP 122/62 | HR 64 | Temp 97.9°F | Resp 16 | Ht 67.0 in | Wt 161.7 lb

## 2021-06-06 DIAGNOSIS — N644 Mastodynia: Secondary | ICD-10-CM | POA: Insufficient documentation

## 2021-06-06 DIAGNOSIS — Z923 Personal history of irradiation: Secondary | ICD-10-CM | POA: Insufficient documentation

## 2021-06-06 DIAGNOSIS — Z9221 Personal history of antineoplastic chemotherapy: Secondary | ICD-10-CM | POA: Insufficient documentation

## 2021-06-06 DIAGNOSIS — N63 Unspecified lump in unspecified breast: Secondary | ICD-10-CM | POA: Insufficient documentation

## 2021-06-06 DIAGNOSIS — Z171 Estrogen receptor negative status [ER-]: Secondary | ICD-10-CM

## 2021-06-06 DIAGNOSIS — C50412 Malignant neoplasm of upper-outer quadrant of left female breast: Secondary | ICD-10-CM | POA: Diagnosis not present

## 2021-06-14 ENCOUNTER — Telehealth: Payer: Self-pay

## 2021-06-14 DIAGNOSIS — E785 Hyperlipidemia, unspecified: Secondary | ICD-10-CM | POA: Diagnosis not present

## 2021-06-14 DIAGNOSIS — R7301 Impaired fasting glucose: Secondary | ICD-10-CM | POA: Diagnosis not present

## 2021-06-14 DIAGNOSIS — R413 Other amnesia: Secondary | ICD-10-CM | POA: Diagnosis not present

## 2021-06-14 DIAGNOSIS — C50912 Malignant neoplasm of unspecified site of left female breast: Secondary | ICD-10-CM | POA: Diagnosis not present

## 2021-06-14 DIAGNOSIS — Z79899 Other long term (current) drug therapy: Secondary | ICD-10-CM | POA: Diagnosis not present

## 2021-06-14 DIAGNOSIS — I10 Essential (primary) hypertension: Secondary | ICD-10-CM | POA: Diagnosis not present

## 2021-06-14 NOTE — Telephone Encounter (Signed)
Called pt, left message regarding mychart message.   Asked pt if she was reaching out regarding appointments here or with PT because I saw a note where PT tried to call her to schedule appointments.  I provided pt with phone number to PT office at [facility] and requested she call back with further questions or concerns so we could assist her.

## 2021-06-21 ENCOUNTER — Other Ambulatory Visit: Payer: Self-pay

## 2021-06-21 ENCOUNTER — Ambulatory Visit: Payer: Medicare Other | Attending: Adult Health | Admitting: Physical Therapy

## 2021-06-21 ENCOUNTER — Encounter: Payer: Self-pay | Admitting: Physical Therapy

## 2021-06-21 DIAGNOSIS — Z171 Estrogen receptor negative status [ER-]: Secondary | ICD-10-CM | POA: Insufficient documentation

## 2021-06-21 DIAGNOSIS — I890 Lymphedema, not elsewhere classified: Secondary | ICD-10-CM | POA: Insufficient documentation

## 2021-06-21 DIAGNOSIS — E039 Hypothyroidism, unspecified: Secondary | ICD-10-CM | POA: Diagnosis not present

## 2021-06-21 DIAGNOSIS — I10 Essential (primary) hypertension: Secondary | ICD-10-CM | POA: Diagnosis not present

## 2021-06-21 DIAGNOSIS — C50412 Malignant neoplasm of upper-outer quadrant of left female breast: Secondary | ICD-10-CM | POA: Diagnosis not present

## 2021-06-21 DIAGNOSIS — N1831 Chronic kidney disease, stage 3a: Secondary | ICD-10-CM | POA: Diagnosis not present

## 2021-06-21 DIAGNOSIS — Z853 Personal history of malignant neoplasm of breast: Secondary | ICD-10-CM | POA: Diagnosis not present

## 2021-06-21 DIAGNOSIS — R000 Tachycardia, unspecified: Secondary | ICD-10-CM | POA: Diagnosis not present

## 2021-06-21 DIAGNOSIS — I700 Atherosclerosis of aorta: Secondary | ICD-10-CM | POA: Diagnosis not present

## 2021-06-21 DIAGNOSIS — E785 Hyperlipidemia, unspecified: Secondary | ICD-10-CM | POA: Diagnosis not present

## 2021-06-21 NOTE — Patient Instructions (Signed)
First of all, check with your insurance company to see if provider is in [place] (for wigs and compression sleeves / gloves/gauntlets )   [address] [place], [place] Phone: [phone]   Fax: [phone] Will file some insurances --- call for appointment   Second to [facility] (for mastectomy prosthetics and garments) [address] [place], [place]  Phone: [phone]  Fax: [phone] Will file some insurances --- call for appointment  [facility]  [address]  [place], [place] [address] [phone] Lower extremity garments  [facility] [address] [place], [place]  [address] [place] [facility] (for compression garments, especilly for lower extremities) [address] [place], [place]  [address] [phone] Call for appointment    Jerrol Banana ,certified fitter [facility]  [phone]  [employer] (for mastectomy supplies and garments) [place], [place] [address] [phone]  Other Resources: National Lymphedema Network:  www.lymphnet.org www.Klosetraining.com for patient articles and self manual lymph drainage information www.[email or web address] has informative articles.  [email or web address] [email or web address]

## 2021-06-21 NOTE — Therapy (Signed)
[facility] [place], [place], [address] Phone: [phone]   Fax:  [phone]  Physical Therapy Evaluation  Patient Details  Name: Carol Byrd MRN: [id number] Date of Birth: 11-23-1945 Referring Provider (PT): Wilber Bihari   Encounter Date: 06/21/2021   PT End of Session - 06/21/21 1116     Visit Number 1    Number of Visits 9    Date for PT Re-Evaluation 07/19/21    PT Start Time 0900    PT Stop Time 0945    PT Time Calculation (min) 45 min    Activity Tolerance Patient tolerated treatment well    Behavior During Therapy [facility] for tasks assessed/performed             Past Medical History:  Diagnosis Date   Abnormal CT scan, stomach 07/26/2020   Achilles tendonitis 10/04/2013   Alopecia    Breast cancer ([place])    Left   Cervical disc disease    Essential hypertension    Family history of breast cancer    Family history of lung cancer    Family history of melanoma    Family history of multiple myeloma    Family history of stomach cancer    GERD (gastroesophageal reflux disease)    Hypothyroidism    Impaired fasting glucose    Metatarsal fracture 03/13/2011   Mixed hyperlipidemia    Personal history of chemotherapy    Personal history of radiation therapy    Port-A-Cath in place 12/01/2018   Vitamin D deficiency     Past Surgical History:  Procedure Laterality Date   BACK SURGERY  1985   bulging lumbar disk   BIOPSY  04/14/2018   Procedure: BIOPSY;  Surgeon: Rogene Houston, MD;  [facility];  Service: Endoscopy;;  esophagus   BREAST LUMPECTOMY Left 03/2019   BREAST LUMPECTOMY WITH RADIOACTIVE SEED AND SENTINEL LYMPH NODE BIOPSY Left 03/29/2019   Procedure: LEFT BREAST LUMPECTOMY WITH RADIOACTIVE SEED AND LEFT AXILLARY SENTINEL LYMPH NODE BIOPSY BLUE DYE INJECTION;  Surgeon: Rolm Bookbinder, MD;  Location: [place];  Service: General;  Laterality:  Left;   cataracts     COLONOSCOPY N/A 02/14/2015   Procedure: COLONOSCOPY;  Surgeon: Rogene Houston, MD;  [facility];  Service: Endoscopy;  Laterality: N/A;  1200   ESOPHAGEAL DILATION N/A 04/14/2018   Procedure: ESOPHAGEAL DILATION;  Surgeon: Rogene Houston, MD;  [facility];  Service: Endoscopy;  Laterality: N/A;   ESOPHAGOGASTRODUODENOSCOPY N/A 04/14/2018   Procedure: ESOPHAGOGASTRODUODENOSCOPY (EGD);  Surgeon: Rogene Houston, MD;  [facility];  Service: Endoscopy;  Laterality: N/A;  3:10   ESOPHAGOGASTRODUODENOSCOPY (EGD) WITH PROPOFOL N/A 08/07/2020   Procedure: ESOPHAGOGASTRODUODENOSCOPY (EGD) WITH PROPOFOL;  Surgeon: Harvel Quale, MD;  [facility];  Service: Gastroenterology;  Laterality: N/A;  AM   PORT-A-CATH REMOVAL Right 03/29/2019   Procedure: REMOVAL PORT-A-CATH;  Surgeon: Rolm Bookbinder, MD;  Location: [place];  Service: General;  Laterality: Right;   PORTACATH PLACEMENT Right 11/30/2018   Procedure: INSERTION PORT-A-CATH WITH ULTRASOUND;  Surgeon: Rolm Bookbinder, MD;  Location: [place];  Service: General;  Laterality: Right;   RETINAL TEAR REPAIR CRYOTHERAPY     THYROIDECTOMY      There were no vitals filed for this visit.    Subjective Assessment - 06/21/21 0857     Subjective "I just have a lot of fullness this breast  since the surgery" She says that she has had tis for a while with no real recent changes She is not having any trouble with her shoulders    Pertinent History 10/2018 Biopsy confirmed left breast  IDC with DCIS, HER-2 negative by FISH (2+ by IHC), ER/PR negative, Ki67 80%.neoadjuvant chemo, 03/2019 lumpectomy with 2 axiilary lymph nodes negative followed by radiation til 05/2019    Patient Stated Goals to help decrease breast swelling    Currently in Pain? No/denies                [facility] PT Assessment - 06/21/21 0001       Assessment   Medical Diagnosis left  breast cancer    Referring Provider (PT) Wilber Bihari    Onset Date/Surgical Date 10/18/18   approx   Hand Dominance Right      Precautions   Precautions Other (comment)    Precaution Comments at risk for lymphedema      Restrictions   Weight Bearing Restrictions No      Balance Screen   Has the patient fallen in the past 6 months No    Has the patient had a decrease in activity level because of a fear of falling?  No    Is the patient reluctant to leave their home because of a fear of falling?  No      Home Ecologist residence    Living Arrangements Spouse/significant other    Available Help at Discharge Available 24 hours/day      Prior Function   Level of Independence Independent    Vocation Retired    Leisure does not exercise on a regular basis, walks her dog,  likes to [employer] and take care of her home  likes to travel      Cognition   Overall Cognitive Status Within Functional Limits for tasks assessed      Observation/Other Assessments   Observations Pt with visible fullness in left breast with congestion and dimpling around incision      Sensation   Light Touch Not tested      Coordination   Gross Motor Movements are Fluid and Coordinated Yes      Posture/Postural Control   Posture/Postural Control No significant limitations      ROM / Strength   AROM / PROM / Strength AROM;Strength      AROM   Overall AROM  Within functional limits for tasks performed    AROM Assessment Site Shoulder    Right/Left Shoulder Right;Left    Right Shoulder Flexion 175 Degrees    Right Shoulder ABduction 178 Degrees    Left Shoulder Flexion 172 Degrees    Left Shoulder ABduction 176 Degrees      Strength   Overall Strength Within functional limits for tasks performed      Palpation   Palpation comment congestion and firmness in left breast with more firmness and tissue contraction with decreased skin excursion around incision. Increased  fibosis perceived at inferior breast               LYMPHEDEMA/ONCOLOGY QUESTIONNAIRE - 06/21/21 0001       Lymphedema Assessments   Lymphedema Assessments Upper extremities      Right Upper Extremity Lymphedema   10 cm Proximal to Olecranon Process 27 cm    Olecranon Process 25 cm    15 cm Proximal to Ulnar Styloid Process 24 cm    Just Proximal to Ulnar Styloid Process  15 cm    Across Hand at [employer] 19 cm    At [place] of 2nd Digit 6.3 cm      Left Upper Extremity Lymphedema   10 cm Proximal to Olecranon Process 26 cm    Olecranon Process 24 cm    15 cm Proximal to Ulnar Styloid Process 22 cm    Just Proximal to Ulnar Styloid Process 15 cm    Across Hand at [employer] 18.5 cm    At [place] of 2nd Digit 6.5 cm                     Objective measurements completed on examination: See above findings.       [facility] Adult PT Treatment/Exercise - 06/21/21 0001       Manual Therapy   Manual Therapy Manual Lymphatic Drainage (MLD);Edema management    Manual therapy comments gave pt information about how to get a compression bra from [employer] with script    Edema Management Pt ususally wears an underwire bra which pushes into skin just below incision. She will try to wear bras with no underwire    Manual Lymphatic Drainage (MLD) began instruction with verbal and hand over hand cues ( briefly) for diaphragmatic breaths, short neck, right axillary nodes, anterior interaxillary anastamosis and left upper breast. Pt will try to start this at home                          PT Long Term Goals - 06/21/21 1121       PT LONG TERM GOAL #1   Title Pt will report the lymphedema symptoms  in her left breast are decreased by 50%    Time 4    Period Weeks    Status New      PT LONG TERM GOAL #2   Title Pt will report she is able to manage her lymphedema at home with self MLD and use of compression bras in addition to exercise and skin care     Time 4    Period Weeks    Status New                    Plan - 06/21/21 1116     Clinical Impression Statement Pt presents with visible and palpable left breast lymphedema following lumpectomy with 2 lymph nodes removed and radiation completed in 05/2019.  She usually wears an underwire bra. She has not had any manual lymph drainage or use of compression and wants to try that. She does not have arm or back lymphdema and shoulder AROM is WNL    Personal Factors and Comorbidities Comorbidity 3+    Comorbidities lumpectomy with sentinel node removal, chemotherapy, radiation    Stability/Clinical Decision Making Stable/Uncomplicated    Clinical Decision Making Low    Rehab Potential Good    PT Frequency 2x / week    PT Duration 4 weeks    PT Treatment/Interventions ADLs/Self Care Home Management;Therapeutic exercise;Orthotic Fit/Training;Patient/family education;Compression bandaging;Manual lymph drainage;Manual techniques;Taping    PT Next Visit Plan teach and perfrom manual lymph drainage to left breast.  Assist with compression bras addin foam patches as needed    Consulted and Agree with Plan of Care Patient             Patient will benefit from skilled therapeutic intervention in order to improve the following deficits and impairments:  Increased fascial  restricitons, Decreased scar mobility, Increased edema  Visit Diagnosis: Lymphedema, not elsewhere classified - Plan: PT plan of care cert/re-cert     Problem List Patient Active Problem List   Diagnosis Date Noted   Absence of bladder sensation 10/24/2020   SUI (stress urinary incontinence, female) 10/24/2020   Sensorineural hearing loss (SNHL) of both ears 02/14/2020   Genetic testing 11/27/2018   Family history of breast cancer    Family history of melanoma    Family history of lung cancer    Family history of stomach cancer    Family history of multiple myeloma    Malignant neoplasm of upper-outer  quadrant of left breast in female, estrogen receptor negative ([place])    Varicose veins of bilateral lower extremities with other complications 49/17/9150   Donato Heinz. Owens Shark PT  Norwood Levo, PT 06/21/2021, 11:24 AM  [facility] @ [place], [place], [address] Phone: [phone]   Fax:  [phone]  Name: Carol Byrd MRN: [id number] Date of Birth: 1946-04-09

## 2021-06-25 ENCOUNTER — Encounter (HOSPITAL_COMMUNITY): Payer: Self-pay | Admitting: Physical Therapy

## 2021-06-25 ENCOUNTER — Ambulatory Visit (HOSPITAL_COMMUNITY): Payer: Medicare Other | Attending: Adult Health | Admitting: Physical Therapy

## 2021-06-25 ENCOUNTER — Other Ambulatory Visit: Payer: Self-pay

## 2021-06-25 DIAGNOSIS — N63 Unspecified lump in unspecified breast: Secondary | ICD-10-CM | POA: Insufficient documentation

## 2021-06-25 DIAGNOSIS — Z171 Estrogen receptor negative status [ER-]: Secondary | ICD-10-CM | POA: Insufficient documentation

## 2021-06-25 DIAGNOSIS — C50412 Malignant neoplasm of upper-outer quadrant of left female breast: Secondary | ICD-10-CM | POA: Insufficient documentation

## 2021-06-25 DIAGNOSIS — I890 Lymphedema, not elsewhere classified: Secondary | ICD-10-CM | POA: Insufficient documentation

## 2021-06-25 NOTE — Therapy (Signed)
[place], [place], [address] Phone: [phone]   Fax:  [phone]  Physical Therapy Treatment  Patient Details  Name: Carol Byrd MRN: [id number] Date of Birth: Feb 11, 1946 Referring Provider (PT): Wilber Bihari   Encounter Date: 06/25/2021   PT End of Session - 06/25/21 1454     Visit Number 2    Number of Visits 9    Date for PT Re-Evaluation 07/19/21    PT Start Time 1400    PT Stop Time 1450    PT Time Calculation (min) 50 min    Activity Tolerance Patient tolerated treatment well    Behavior During Therapy [facility] for tasks assessed/performed             Past Medical History:  Diagnosis Date   Abnormal CT scan, stomach 07/26/2020   Achilles tendonitis 10/04/2013   Alopecia    Breast cancer ([place])    Left   Cervical disc disease    Essential hypertension    Family history of breast cancer    Family history of lung cancer    Family history of melanoma    Family history of multiple myeloma    Family history of stomach cancer    GERD (gastroesophageal reflux disease)    Hypothyroidism    Impaired fasting glucose    Metatarsal fracture 03/13/2011   Mixed hyperlipidemia    Personal history of chemotherapy    Personal history of radiation therapy    Port-A-Cath in place 12/01/2018   Vitamin D deficiency     Past Surgical History:  Procedure Laterality Date   BACK SURGERY  1985   bulging lumbar disk   BIOPSY  04/14/2018   Procedure: BIOPSY;  Surgeon: Rogene Houston, MD;  [facility];  Service: Endoscopy;;  esophagus   BREAST LUMPECTOMY Left 03/2019   BREAST LUMPECTOMY WITH RADIOACTIVE SEED AND SENTINEL LYMPH NODE BIOPSY Left 03/29/2019   Procedure: LEFT BREAST LUMPECTOMY WITH RADIOACTIVE SEED AND LEFT AXILLARY SENTINEL LYMPH NODE BIOPSY BLUE DYE INJECTION;  Surgeon: Rolm Bookbinder, MD;  Location: [place];  Service: General;  Laterality: Left;    cataracts     COLONOSCOPY N/A 02/14/2015   Procedure: COLONOSCOPY;  Surgeon: Rogene Houston, MD;  [facility];  Service: Endoscopy;  Laterality: N/A;  1200   ESOPHAGEAL DILATION N/A 04/14/2018   Procedure: ESOPHAGEAL DILATION;  Surgeon: Rogene Houston, MD;  [facility];  Service: Endoscopy;  Laterality: N/A;   ESOPHAGOGASTRODUODENOSCOPY N/A 04/14/2018   Procedure: ESOPHAGOGASTRODUODENOSCOPY (EGD);  Surgeon: Rogene Houston, MD;  [facility];  Service: Endoscopy;  Laterality: N/A;  3:10   ESOPHAGOGASTRODUODENOSCOPY (EGD) WITH PROPOFOL N/A 08/07/2020   Procedure: ESOPHAGOGASTRODUODENOSCOPY (EGD) WITH PROPOFOL;  Surgeon: Harvel Quale, MD;  [facility];  Service: Gastroenterology;  Laterality: N/A;  AM   PORT-A-CATH REMOVAL Right 03/29/2019   Procedure: REMOVAL PORT-A-CATH;  Surgeon: Rolm Bookbinder, MD;  Location: [place];  Service: General;  Laterality: Right;   PORTACATH PLACEMENT Right 11/30/2018   Procedure: INSERTION PORT-A-CATH WITH ULTRASOUND;  Surgeon: Rolm Bookbinder, MD;  Location: [place];  Service: General;  Laterality: Right;   RETINAL TEAR REPAIR CRYOTHERAPY     THYROIDECTOMY      There were no vitals filed for this visit.   Subjective Assessment - 06/25/21 1447     Subjective Pt states that she was not given and exercises or a self  manual sheet.    Currently in Pain? No/denies    Pain Score 0-No pain                               [facility] Adult PT Treatment/Exercise - 06/25/21 0001       Manual Therapy   Manual Therapy Manual Lymphatic Drainage (MLD);Edema management    Manual Lymphatic Drainage (MLD) To include supraclavicular, deep and superfical abdominal, LT/RT axillary and Lt axillary/inguinal anastomosis and UE.                     PT Education - 06/25/21 1452     Education Details UE exercises and diaphragmic breathing, self massage    Person(s)  Educated Patient    Methods Explanation;Handout    Comprehension Verbalized understanding                 PT Long Term Goals - 06/25/21 1456       PT LONG TERM GOAL #1   Title Pt will report the lymphedema symptoms  in her left breast are decreased by 50%    Time 4    Period Weeks    Status On-going      PT LONG TERM GOAL #2   Title Pt will report she is able to manage her lymphedema at home with self MLD and use of compression bras in addition to exercise and skin care    Time 4    Period Weeks    Status On-going                   Plan - 06/25/21 1454     Clinical Impression Statement Began manual decongestive techniques for LT UE with minimal time spent on UE and back due to no sx of edema in these areas. Therapist instructed pt  in self massaage and UE exercises including lymph press.    Personal Factors and Comorbidities Comorbidity 3+    Comorbidities lumpectomy with sentinel node removal, chemotherapy, radiation    Stability/Clinical Decision Making Stable/Uncomplicated    Rehab Potential Good    PT Frequency 2x / week    PT Duration 4 weeks    PT Treatment/Interventions ADLs/Self Care Home Management;Therapeutic exercise;Orthotic Fit/Training;Patient/family education;Compression bandaging;Manual lymph drainage;Manual techniques;Taping    PT Next Visit Plan Continues to teach and perfrom manual lymph drainage to left breast.  Assist with compression bras addin foam patches as needed    Consulted and Agree with Plan of Care Patient             Patient will benefit from skilled therapeutic intervention in order to improve the following deficits and impairments:  Increased fascial restricitons, Decreased scar mobility, Increased edema  Visit Diagnosis: Lymphedema, not elsewhere classified     Problem List Patient Active Problem List   Diagnosis Date Noted   Absence of bladder sensation 10/24/2020   SUI (stress urinary incontinence, female)  10/24/2020   Sensorineural hearing loss (SNHL) of both ears 02/14/2020   Genetic testing 11/27/2018   Family history of breast cancer    Family history of melanoma    Family history of lung cancer    Family history of stomach cancer    Family history of multiple myeloma    Malignant neoplasm of upper-outer quadrant of left breast in female, estrogen receptor negative ([place])    Varicose veins of bilateral lower extremities with other complications 99/77/4142   Caren Griffins  Joneen Caraway, [place] [phone]  06/25/2021, 2:58 PM  [facility] [address] [place], [place], [address] Phone: [phone]   Fax:  [phone]  Name: Prescilla Monger MRN: [id number] Date of Birth: 04-09-1946

## 2021-06-27 ENCOUNTER — Other Ambulatory Visit: Payer: Self-pay

## 2021-06-27 ENCOUNTER — Ambulatory Visit (HOSPITAL_COMMUNITY): Payer: Medicare Other | Admitting: Physical Therapy

## 2021-06-27 DIAGNOSIS — I890 Lymphedema, not elsewhere classified: Secondary | ICD-10-CM

## 2021-06-27 DIAGNOSIS — C50412 Malignant neoplasm of upper-outer quadrant of left female breast: Secondary | ICD-10-CM | POA: Diagnosis not present

## 2021-06-27 DIAGNOSIS — N63 Unspecified lump in unspecified breast: Secondary | ICD-10-CM | POA: Diagnosis not present

## 2021-06-27 DIAGNOSIS — Z171 Estrogen receptor negative status [ER-]: Secondary | ICD-10-CM | POA: Diagnosis not present

## 2021-06-27 NOTE — Therapy (Signed)
[place], [place], [address] Phone: [phone]   Fax:  [phone]  Physical Therapy Treatment  Patient Details  Name: Carol Byrd MRN: [id number] Date of Birth: 01/11/1946 Referring Provider (PT): Wilber Bihari   Encounter Date: 06/27/2021   PT End of Session - 06/27/21 1842     Visit Number 3    Number of Visits 9    Date for PT Re-Evaluation 07/19/21    PT Start Time 1517    PT Stop Time 6160    PT Time Calculation (min) 40 min    Activity Tolerance Patient tolerated treatment well    Behavior During Therapy [facility] for tasks assessed/performed             Past Medical History:  Diagnosis Date   Abnormal CT scan, stomach 07/26/2020   Achilles tendonitis 10/04/2013   Alopecia    Breast cancer ([place])    Left   Cervical disc disease    Essential hypertension    Family history of breast cancer    Family history of lung cancer    Family history of melanoma    Family history of multiple myeloma    Family history of stomach cancer    GERD (gastroesophageal reflux disease)    Hypothyroidism    Impaired fasting glucose    Metatarsal fracture 03/13/2011   Mixed hyperlipidemia    Personal history of chemotherapy    Personal history of radiation therapy    Port-A-Cath in place 12/01/2018   Vitamin D deficiency     Past Surgical History:  Procedure Laterality Date   BACK SURGERY  1985   bulging lumbar disk   BIOPSY  04/14/2018   Procedure: BIOPSY;  Surgeon: Rogene Houston, MD;  [facility];  Service: Endoscopy;;  esophagus   BREAST LUMPECTOMY Left 03/2019   BREAST LUMPECTOMY WITH RADIOACTIVE SEED AND SENTINEL LYMPH NODE BIOPSY Left 03/29/2019   Procedure: LEFT BREAST LUMPECTOMY WITH RADIOACTIVE SEED AND LEFT AXILLARY SENTINEL LYMPH NODE BIOPSY BLUE DYE INJECTION;  Surgeon: Rolm Bookbinder, MD;  Location: [place];  Service: General;  Laterality: Left;    cataracts     COLONOSCOPY N/A 02/14/2015   Procedure: COLONOSCOPY;  Surgeon: Rogene Houston, MD;  [facility];  Service: Endoscopy;  Laterality: N/A;  1200   ESOPHAGEAL DILATION N/A 04/14/2018   Procedure: ESOPHAGEAL DILATION;  Surgeon: Rogene Houston, MD;  [facility];  Service: Endoscopy;  Laterality: N/A;   ESOPHAGOGASTRODUODENOSCOPY N/A 04/14/2018   Procedure: ESOPHAGOGASTRODUODENOSCOPY (EGD);  Surgeon: Rogene Houston, MD;  [facility];  Service: Endoscopy;  Laterality: N/A;  3:10   ESOPHAGOGASTRODUODENOSCOPY (EGD) WITH PROPOFOL N/A 08/07/2020   Procedure: ESOPHAGOGASTRODUODENOSCOPY (EGD) WITH PROPOFOL;  Surgeon: Harvel Quale, MD;  [facility];  Service: Gastroenterology;  Laterality: N/A;  AM   PORT-A-CATH REMOVAL Right 03/29/2019   Procedure: REMOVAL PORT-A-CATH;  Surgeon: Rolm Bookbinder, MD;  Location: [place];  Service: General;  Laterality: Right;   PORTACATH PLACEMENT Right 11/30/2018   Procedure: INSERTION PORT-A-CATH WITH ULTRASOUND;  Surgeon: Rolm Bookbinder, MD;  Location: [place];  Service: General;  Laterality: Right;   RETINAL TEAR REPAIR CRYOTHERAPY     THYROIDECTOMY      There were no vitals filed for this visit.   Subjective Assessment - 06/27/21 1840     Subjective pt states she has not been to [employer] yet for  bras.  STates she still has tightness in her Lt breast but no pain    Currently in Pain? No/denies                               [facility] Adult PT Treatment/Exercise - 06/27/21 0001       Manual Therapy   Manual Therapy Manual Lymphatic Drainage (MLD);Edema management    Manual Lymphatic Drainage (MLD) To include supraclavicular, deep and superfical abdominal, LT/RT axillary and Lt axillary/inguinal anastomosis and UE. supine and Rt sidelying postions                          PT Long Term Goals - 06/25/21 1456       PT LONG  TERM GOAL #1   Title Pt will report the lymphedema symptoms  in her left breast are decreased by 50%    Time 4    Period Weeks    Status On-going      PT LONG TERM GOAL #2   Title Pt will report she is able to manage her lymphedema at home with self MLD and use of compression bras in addition to exercise and skin care    Time 4    Period Weeks    Status On-going                   Plan - 06/27/21 1844     Clinical Impression Statement Continued with manual lymph drainage focusing on Lt breast and axillary region.  Pt able to demonstrate and verbalize self-manual, however unable to present with correct routing of fluid.   Induration still present in Lt breast and scar tissue at surgical site.  Small piece of  inch foam cut, covered in netting and placed inside upper edge of bra to help reduce edema and induration in this area.  Pt also educated on scar massage.  Pt will benefit from continued skilled lymphedema therapy.  Encouraged to call [facility] first to make an appointment and inquire if she will need an MD order.    Personal Factors and Comorbidities Comorbidity 3+    Comorbidities lumpectomy with sentinel node removal, chemotherapy, radiation    Stability/Clinical Decision Making Stable/Uncomplicated    Rehab Potential Good    PT Frequency 2x / week    PT Duration 4 weeks    PT Treatment/Interventions ADLs/Self Care Home Management;Therapeutic exercise;Orthotic Fit/Training;Patient/family education;Compression bandaging;Manual lymph drainage;Manual techniques;Taping    PT Next Visit Plan Continue to teach self manual and perform manual lymph drainage to left breast.  F/U with use of foam if helped in indurated area; F/U if contacted [employer].    Consulted and Agree with Plan of Care Patient             Patient will benefit from skilled therapeutic intervention in order to improve the following deficits and impairments:  Increased fascial restricitons,  Decreased scar mobility, Increased edema  Visit Diagnosis: Lymphedema, not elsewhere classified     Problem List Patient Active Problem List   Diagnosis Date Noted   Absence of bladder sensation 10/24/2020   SUI (stress urinary incontinence, female) 10/24/2020   Sensorineural hearing loss (SNHL) of both ears 02/14/2020   Genetic testing 11/27/2018   Family history of breast cancer    Family history of melanoma    Family history of lung cancer    Family history of  stomach cancer    Family history of multiple myeloma    Malignant neoplasm of upper-outer quadrant of left breast in female, estrogen receptor negative ([place])    Varicose veins of bilateral lower extremities with other complications 94/32/0037   Teena Irani, PTA/CLT, [facility] [phone]  Teena Irani, PTA 06/27/2021, 6:46 PM  [facility] [address] [place], [place], [address] Phone: [phone]   Fax:  [phone]  Name: Carol Byrd MRN: [id number] Date of Birth: January 02, 1946

## 2021-07-01 ENCOUNTER — Other Ambulatory Visit: Payer: Self-pay

## 2021-07-01 ENCOUNTER — Ambulatory Visit (HOSPITAL_COMMUNITY): Payer: Medicare Other | Admitting: Physical Therapy

## 2021-07-01 DIAGNOSIS — I890 Lymphedema, not elsewhere classified: Secondary | ICD-10-CM | POA: Diagnosis not present

## 2021-07-01 DIAGNOSIS — Z171 Estrogen receptor negative status [ER-]: Secondary | ICD-10-CM | POA: Diagnosis not present

## 2021-07-01 DIAGNOSIS — C50412 Malignant neoplasm of upper-outer quadrant of left female breast: Secondary | ICD-10-CM | POA: Diagnosis not present

## 2021-07-01 DIAGNOSIS — N63 Unspecified lump in unspecified breast: Secondary | ICD-10-CM | POA: Diagnosis not present

## 2021-07-01 NOTE — Therapy (Signed)
[place], [place], [address] Phone: [phone]   Fax:  [phone]  Physical Therapy Treatment  Patient Details  Name: Carol Byrd MRN: [id number] Date of Birth: [birth year] Referring Provider (PT): Wilber Bihari   Encounter Date: 07/01/2021   PT End of Session - 07/01/21 1735     Visit Number 4    Number of Visits 9    Date for PT Re-Evaluation 07/19/21    PT Start Time 0037    PT Stop Time 0488    PT Time Calculation (min) 35 min    Activity Tolerance Patient tolerated treatment well    Behavior During Therapy [facility] for tasks assessed/performed             Past Medical History:  Diagnosis Date   Abnormal CT scan, stomach 07/26/2020   Achilles tendonitis 10/04/2013   Alopecia    Breast cancer ([place])    Left   Cervical disc disease    Essential hypertension    Family history of breast cancer    Family history of lung cancer    Family history of melanoma    Family history of multiple myeloma    Family history of stomach cancer    GERD (gastroesophageal reflux disease)    Hypothyroidism    Impaired fasting glucose    Metatarsal fracture 03/13/2011   Mixed hyperlipidemia    Personal history of chemotherapy    Personal history of radiation therapy    Port-A-Cath in place 12/01/2018   Vitamin D deficiency     Past Surgical History:  Procedure Laterality Date   BACK SURGERY  1985   bulging lumbar disk   BIOPSY  04/14/2018   Procedure: BIOPSY;  Surgeon: Rogene Houston, MD;  [facility];  Service: Endoscopy;;  esophagus   BREAST LUMPECTOMY Left 03/2019   BREAST LUMPECTOMY WITH RADIOACTIVE SEED AND SENTINEL LYMPH NODE BIOPSY Left 03/29/2019   Procedure: LEFT BREAST LUMPECTOMY WITH RADIOACTIVE SEED AND LEFT AXILLARY SENTINEL LYMPH NODE BIOPSY BLUE DYE INJECTION;  Surgeon: Rolm Bookbinder, MD;  Location: [place];  Service: General;  Laterality: Left;    cataracts     COLONOSCOPY N/A 02/14/2015   Procedure: COLONOSCOPY;  Surgeon: Rogene Houston, MD;  [facility];  Service: Endoscopy;  Laterality: N/A;  1200   ESOPHAGEAL DILATION N/A 04/14/2018   Procedure: ESOPHAGEAL DILATION;  Surgeon: Rogene Houston, MD;  [facility];  Service: Endoscopy;  Laterality: N/A;   ESOPHAGOGASTRODUODENOSCOPY N/A 04/14/2018   Procedure: ESOPHAGOGASTRODUODENOSCOPY (EGD);  Surgeon: Rogene Houston, MD;  [facility];  Service: Endoscopy;  Laterality: N/A;  3:10   ESOPHAGOGASTRODUODENOSCOPY (EGD) WITH PROPOFOL N/A 08/07/2020   Procedure: ESOPHAGOGASTRODUODENOSCOPY (EGD) WITH PROPOFOL;  Surgeon: Harvel Quale, MD;  [facility];  Service: Gastroenterology;  Laterality: N/A;  AM   PORT-A-CATH REMOVAL Right 03/29/2019   Procedure: REMOVAL PORT-A-CATH;  Surgeon: Rolm Bookbinder, MD;  Location: [place];  Service: General;  Laterality: Right;   PORTACATH PLACEMENT Right 11/30/2018   Procedure: INSERTION PORT-A-CATH WITH ULTRASOUND;  Surgeon: Rolm Bookbinder, MD;  Location: [place];  Service: General;  Laterality: Right;   RETINAL TEAR REPAIR CRYOTHERAPY     THYROIDECTOMY      There were no vitals filed for this visit.   Subjective Assessment - 07/01/21 1733     Subjective PT states some days it feels better and some days she cant  tell a difference.  STates shes been using the foam some and reduces hardness.  STate sshe has not contacted [facility] yet.    Currently in Pain? No/denies                               [facility] Adult PT Treatment/Exercise - 07/01/21 0001       Manual Therapy   Manual Therapy Manual Lymphatic Drainage (MLD);Edema management    Manual therapy comments completed seperately from all other skilled interventions    Manual Lymphatic Drainage (MLD) To include supraclavicular, deep and superfical abdominal, LT/RT axillary and Lt  axillary/inguinal anastomosis and UE. supine and Rt sidelying postions                          PT Long Term Goals - 06/25/21 1456       PT LONG TERM GOAL #1   Title Pt will report the lymphedema symptoms  in her left breast are decreased by 50%    Time 4    Period Weeks    Status On-going      PT LONG TERM GOAL #2   Title Pt will report she is able to manage her lymphedema at home with self MLD and use of compression bras in addition to exercise and skin care    Time 4    Period Weeks    Status On-going                   Plan - 07/01/21 1735     Clinical Impression Statement continued with manual lymph drainage both supine and Rt sidelying for Lt axillary, breast and UE.  Most induration persists in Lt breast region, less in axillary today.  Encouraged to make appt for her bras as soon as possible.  Pt will continue to benefit from manual lymph drainage and education.    PT Next Visit Plan continue with manual lymph drainage for Lt UE/breast and axillary region.             Patient will benefit from skilled therapeutic intervention in order to improve the following deficits and impairments:     Visit Diagnosis: Lymphedema, not elsewhere classified     Problem List Patient Active Problem List   Diagnosis Date Noted   Absence of bladder sensation 10/24/2020   SUI (stress urinary incontinence, female) 10/24/2020   Sensorineural hearing loss (SNHL) of both ears 02/14/2020   Genetic testing 11/27/2018   Family history of breast cancer    Family history of melanoma    Family history of lung cancer    Family history of stomach cancer    Family history of multiple myeloma    Malignant neoplasm of upper-outer quadrant of left breast in female, estrogen receptor negative ([place])    Varicose veins of bilateral lower extremities with other complications 27/11/8673   Teena Irani, PTA/CLT, WTA [phone]  Teena Irani, PTA 07/01/2021, 5:37  PM  [place], [place], [address] Phone: [phone]   Fax:  [phone]  Name: Carol Byrd MRN: [id number] Date of Birth: May 22, 1945

## 2021-07-02 ENCOUNTER — Encounter (HOSPITAL_COMMUNITY): Payer: Medicare Other

## 2021-07-03 ENCOUNTER — Ambulatory Visit (HOSPITAL_COMMUNITY): Payer: Medicare Other | Admitting: Physical Therapy

## 2021-07-03 ENCOUNTER — Other Ambulatory Visit: Payer: Self-pay

## 2021-07-03 DIAGNOSIS — Z171 Estrogen receptor negative status [ER-]: Secondary | ICD-10-CM | POA: Diagnosis not present

## 2021-07-03 DIAGNOSIS — I890 Lymphedema, not elsewhere classified: Secondary | ICD-10-CM | POA: Diagnosis not present

## 2021-07-03 DIAGNOSIS — N63 Unspecified lump in unspecified breast: Secondary | ICD-10-CM | POA: Diagnosis not present

## 2021-07-03 DIAGNOSIS — C50412 Malignant neoplasm of upper-outer quadrant of left female breast: Secondary | ICD-10-CM | POA: Diagnosis not present

## 2021-07-03 NOTE — Therapy (Signed)
[place], [place], [address] Phone: [phone]   Fax:  [phone]  Physical Therapy Treatment  Patient Details  Name: Carol Byrd MRN: [id number] Date of Birth: [birth year] Referring Provider (PT): Wilber Bihari   Encounter Date: 07/03/2021   PT End of Session - 07/03/21 1738     Visit Number 5    Number of Visits 9    Date for PT Re-Evaluation 07/19/21    PT Start Time 0037    PT Stop Time 1220    PT Time Calculation (min) 38 min    Activity Tolerance Patient tolerated treatment well    Behavior During Therapy [facility] for tasks assessed/performed             Past Medical History:  Diagnosis Date   Abnormal CT scan, stomach 07/26/2020   Achilles tendonitis 10/04/2013   Alopecia    Breast cancer ([place])    Left   Cervical disc disease    Essential hypertension    Family history of breast cancer    Family history of lung cancer    Family history of melanoma    Family history of multiple myeloma    Family history of stomach cancer    GERD (gastroesophageal reflux disease)    Hypothyroidism    Impaired fasting glucose    Metatarsal fracture 03/13/2011   Mixed hyperlipidemia    Personal history of chemotherapy    Personal history of radiation therapy    Port-A-Cath in place 12/01/2018   Vitamin D deficiency     Past Surgical History:  Procedure Laterality Date   BACK SURGERY  1985   bulging lumbar disk   BIOPSY  04/14/2018   Procedure: BIOPSY;  Surgeon: Rogene Houston, MD;  [facility];  Service: Endoscopy;;  esophagus   BREAST LUMPECTOMY Left 03/2019   BREAST LUMPECTOMY WITH RADIOACTIVE SEED AND SENTINEL LYMPH NODE BIOPSY Left 03/29/2019   Procedure: LEFT BREAST LUMPECTOMY WITH RADIOACTIVE SEED AND LEFT AXILLARY SENTINEL LYMPH NODE BIOPSY BLUE DYE INJECTION;  Surgeon: Rolm Bookbinder, MD;  Location: [place];  Service: General;  Laterality: Left;    cataracts     COLONOSCOPY N/A 02/14/2015   Procedure: COLONOSCOPY;  Surgeon: Rogene Houston, MD;  [facility];  Service: Endoscopy;  Laterality: N/A;  1200   ESOPHAGEAL DILATION N/A 04/14/2018   Procedure: ESOPHAGEAL DILATION;  Surgeon: Rogene Houston, MD;  [facility];  Service: Endoscopy;  Laterality: N/A;   ESOPHAGOGASTRODUODENOSCOPY N/A 04/14/2018   Procedure: ESOPHAGOGASTRODUODENOSCOPY (EGD);  Surgeon: Rogene Houston, MD;  [facility];  Service: Endoscopy;  Laterality: N/A;  3:10   ESOPHAGOGASTRODUODENOSCOPY (EGD) WITH PROPOFOL N/A 08/07/2020   Procedure: ESOPHAGOGASTRODUODENOSCOPY (EGD) WITH PROPOFOL;  Surgeon: Harvel Quale, MD;  [facility];  Service: Gastroenterology;  Laterality: N/A;  AM   PORT-A-CATH REMOVAL Right 03/29/2019   Procedure: REMOVAL PORT-A-CATH;  Surgeon: Rolm Bookbinder, MD;  Location: [place];  Service: General;  Laterality: Right;   PORTACATH PLACEMENT Right 11/30/2018   Procedure: INSERTION PORT-A-CATH WITH ULTRASOUND;  Surgeon: Rolm Bookbinder, MD;  Location: [place];  Service: General;  Laterality: Right;   RETINAL TEAR REPAIR CRYOTHERAPY     THYROIDECTOMY      There were no vitals filed for this visit.   Subjective Assessment - 07/03/21 1736     Subjective Pt states she can tell a difference, her Lt is not as  tight as it used to be.  STates she is completing some self manual.    Currently in Pain? No/denies                               [facility] Adult PT Treatment/Exercise - 07/03/21 0001       Manual Therapy   Manual Therapy Manual Lymphatic Drainage (MLD);Edema management;Other (comment)    Manual therapy comments completed seperately from all other skilled interventions    Manual Lymphatic Drainage (MLD) To include supraclavicular, deep and superfical abdominal, LT/RT axillary and Lt axillary/inguinal anastomosis and UE. supine and Rt sidelying  postions    Other Manual Therapy self manual                          PT Long Term Goals - 06/25/21 1456       PT LONG TERM GOAL #1   Title Pt will report the lymphedema symptoms  in her left breast are decreased by 50%    Time 4    Period Weeks    Status On-going      PT LONG TERM GOAL #2   Title Pt will report she is able to manage her lymphedema at home with self MLD and use of compression bras in addition to exercise and skin care    Time 4    Period Weeks    Status On-going                   Plan - 07/03/21 1739     Clinical Impression Statement PT reported she is doing great.  PT demonstrated self-manual, however did not initiate treatment with clearing of Rt axillary and Lt inguinal nodes.  Reviewed this with patient and educated on the watersheds and overall massage technique.  Pt also has not contacted [facility] so was given information again on this and urged to do so before her next appt.  Initially patient requested for today to be her last day, however noted she needed one more session of self-manual instruction to ensure she was completing this correctly.  Lt continues to be denser than Rt breast, however has reduced and scar is softening.    PT Next Visit Plan Review manual lymph drainage for Lt UE/breast and axillary region.  Possibly discharge             Patient will benefit from skilled therapeutic intervention in order to improve the following deficits and impairments:     Visit Diagnosis: Lymphedema, not elsewhere classified     Problem List Patient Active Problem List   Diagnosis Date Noted   Absence of bladder sensation 10/24/2020   SUI (stress urinary incontinence, female) 10/24/2020   Sensorineural hearing loss (SNHL) of both ears 02/14/2020   Genetic testing 11/27/2018   Family history of breast cancer    Family history of melanoma    Family history of lung cancer    Family history of stomach cancer     Family history of multiple myeloma    Malignant neoplasm of upper-outer quadrant of left breast in female, estrogen receptor negative ([place])    Varicose veins of bilateral lower extremities with other complications 20/23/3435   Teena Irani, PTA/CLT, WTA [phone]  Teena Irani, PTA 07/03/2021, 5:40 PM  [place], [place], [address] Phone: [phone]   Fax:  [phone]  Name: Delphine Sizemore MRN: [id number] Date of Birth: 18-Dec-1945

## 2021-07-05 ENCOUNTER — Ambulatory Visit (HOSPITAL_COMMUNITY): Payer: Medicare Other | Admitting: Physical Therapy

## 2021-07-09 ENCOUNTER — Ambulatory Visit (HOSPITAL_COMMUNITY): Payer: Medicare Other | Admitting: Physical Therapy

## 2021-07-09 ENCOUNTER — Other Ambulatory Visit: Payer: Self-pay

## 2021-07-09 ENCOUNTER — Encounter (HOSPITAL_COMMUNITY): Payer: Self-pay | Admitting: Physical Therapy

## 2021-07-09 DIAGNOSIS — I890 Lymphedema, not elsewhere classified: Secondary | ICD-10-CM | POA: Diagnosis not present

## 2021-07-09 DIAGNOSIS — C50412 Malignant neoplasm of upper-outer quadrant of left female breast: Secondary | ICD-10-CM | POA: Diagnosis not present

## 2021-07-09 DIAGNOSIS — Z171 Estrogen receptor negative status [ER-]: Secondary | ICD-10-CM | POA: Diagnosis not present

## 2021-07-09 DIAGNOSIS — N63 Unspecified lump in unspecified breast: Secondary | ICD-10-CM | POA: Diagnosis not present

## 2021-07-09 NOTE — Therapy (Signed)
[place] °[facility] °[address] °[place], [place], [address] °Phone: [phone]   Fax:  [phone] ° °Physical Therapy Treatment ° °Patient Details  °Name: Carol Byrd °MRN: [id number] °Date of Birth: 05/28/1945 °Referring Provider (PT): Lindsey Causey ° °PHYSICAL THERAPY DISCHARGE SUMMARY ° °Visits from Start of Care: 6 ° °Current functional level related to goals / functional outcomes: °PT was not completing manual on a daily basis.  Pt states did not realize that she needed to although she was told at every treatment that she should be completing self manual  °Daily.  °  °Remaining deficits: °Pt states that she feels that she has increased volume about 50% of the time now.  Feels better after she completes self manual.   ° °  °Education / Equipment: °Self manual   ° °Patient agrees to discharge. Patient goals were partially met; pt will be going to [facility] for a compression bra tomorrow.   Patient is being discharged due to being pleased with the current functional level.  °Encounter Date: 07/09/2021 ° ° PT End of Session - 07/09/21 1518   ° ° Visit Number 6   ° Number of Visits 6   ° Date for PT Re-Evaluation 07/19/21   ° PT Start Time 1530   ° PT Stop Time 1610   ° PT Time Calculation (min) 40 min   ° Activity Tolerance Patient tolerated treatment well   ° Behavior During Therapy [facility] for tasks assessed/performed   ° °  °  ° °  ° ° °Past Medical History:  °Diagnosis Date  ° Abnormal CT scan, stomach 07/26/2020  ° Achilles tendonitis 10/04/2013  ° Alopecia   ° Breast cancer ([facility])   ° Left  ° Cervical disc disease   ° Essential hypertension   ° Family history of breast cancer   ° Family history of lung cancer   ° Family history of melanoma   ° Family history of multiple myeloma   ° Family history of stomach cancer   ° GERD (gastroesophageal reflux disease)   ° Hypothyroidism   ° Impaired fasting glucose   ° Metatarsal fracture 03/13/2011  ° Mixed  hyperlipidemia   ° Personal history of chemotherapy   ° Personal history of radiation therapy   ° Port-A-Cath in place 12/01/2018  ° Vitamin D deficiency   ° ° °Past Surgical History:  °Procedure Laterality Date  ° BACK SURGERY  1985  ° bulging lumbar disk  ° BIOPSY  04/14/2018  ° Procedure: BIOPSY;  Surgeon: Rehman, Najeeb U, MD;  [facility];  Service: Endoscopy;;  esophagus  ° BREAST LUMPECTOMY Left 03/2019  ° BREAST LUMPECTOMY WITH RADIOACTIVE SEED AND SENTINEL LYMPH NODE BIOPSY Left 03/29/2019  ° Procedure: LEFT BREAST LUMPECTOMY WITH RADIOACTIVE SEED AND LEFT AXILLARY SENTINEL LYMPH NODE BIOPSY BLUE DYE INJECTION;  Surgeon: Wakefield, Matthew, MD;  Location: [facility];  Service: General;  Laterality: Left;  ° cataracts    ° COLONOSCOPY N/A 02/14/2015  ° Procedure: COLONOSCOPY;  Surgeon: Najeeb U Rehman, MD;  [facility];  Service: Endoscopy;  Laterality: N/A;  1200  ° ESOPHAGEAL DILATION N/A 04/14/2018  ° Procedure: ESOPHAGEAL DILATION;  Surgeon: Rehman, Najeeb U, MD;  [facility];  Service: Endoscopy;  Laterality: N/A;  ° ESOPHAGOGASTRODUODENOSCOPY N/A 04/14/2018  ° Procedure: ESOPHAGOGASTRODUODENOSCOPY (EGD);  Surgeon: Rehman, Najeeb U, MD;  [facility];  Service: Endoscopy;  Laterality: N/A;  3:10  ° ESOPHAGOGASTRODUODENOSCOPY (  EGD) WITH PROPOFOL N/A 08/07/2020   Procedure: ESOPHAGOGASTRODUODENOSCOPY (EGD) WITH PROPOFOL;  Surgeon: Harvel Quale, MD;  [facility];  Service: Gastroenterology;  Laterality: N/A;  AM   PORT-A-CATH REMOVAL Right 03/29/2019   Procedure: REMOVAL PORT-A-CATH;  Surgeon: Rolm Bookbinder, MD;  Location: [place];  Service: General;  Laterality: Right;   PORTACATH PLACEMENT Right 11/30/2018   Procedure: INSERTION PORT-A-CATH WITH ULTRASOUND;  Surgeon: Rolm Bookbinder, MD;  Location: [place];  Service: General;  Laterality: Right;   RETINAL TEAR REPAIR CRYOTHERAPY      THYROIDECTOMY      There were no vitals filed for this visit.   Subjective Assessment - 07/09/21 1611     Subjective Pt states she has an appointment with [facility] tomorrow.   States on the days that she does not do her self manual she can tell that she is fuller in the Lt breast.    Pertinent History 10/2018 Biopsy confirmed left breast  IDC with DCIS, HER-2 negative by FISH (2+ by IHC), ER/PR negative, Ki67 80%.neoadjuvant chemo, 03/2019 lumpectomy with 2 axiilary lymph nodes negative followed by radiation til 05/2019    Patient Stated Goals to help decrease breast swelling    Currently in Pain? No/denies                               [facility] Adult PT Treatment/Exercise - 07/09/21 0001       Manual Therapy   Manual Therapy Manual Lymphatic Drainage (MLD);Edema management;Other (comment)    Manual therapy comments completed seperately from all other skilled interventions    Manual Lymphatic Drainage (MLD) To include supraclavicular, deep and superfical abdominal, LT/RT axillary and Lt axillary/inguinal anastomosis and UE.Posterior completed prone.    Other Manual Therapy self manual                     PT Education - 07/09/21 1613     Education Details that lymphedema never goes away and that she should be completing her self manual daily.  Reviewed proper technique.    Person(s) Educated Patient    Methods Explanation                 PT Long Term Goals - 07/09/21 1521       PT LONG TERM GOAL #1   Title Pt will report the lymphedema symptoms  in her left breast are decreased by 50%    Time 4    Period Weeks    Status Achieved      PT LONG TERM GOAL #2   Title Pt will report she is able to manage her lymphedema at home with self MLD and use of compression bras in addition to exercise and skin care    Time 4    Period Weeks    Status Partially Met   Pt will be obtaining compression bra tomorrow.                  Plan  - 07/09/21 1518     Clinical Impression Statement Pt has [facility] tomorrow.   PT feels comfortable with self manual and requests that today be her last day.    Personal Factors and Comorbidities Comorbidity 3+    Comorbidities lumpectomy with sentinel node removal, chemotherapy, radiation    Stability/Clinical Decision Making Stable/Uncomplicated    Rehab Potential Good    PT  Frequency 2x / week   ° PT Duration 4 weeks   ° PT Treatment/Interventions ADLs/Self Care Home Management;Therapeutic exercise;Orthotic Fit/Training;Patient/family education;Compression bandaging;Manual lymph drainage;Manual techniques;Taping   ° PT Next Visit Plan Discharge.   ° Consulted and Agree with Plan of Care Patient   ° °  °  ° °  ° ° °Patient will benefit from skilled therapeutic intervention in order to improve the following deficits and impairments:  Increased fascial restricitons, Decreased scar mobility, Increased edema ° °Visit Diagnosis: °Lymphedema, not elsewhere classified ° ° ° ° °Problem List °Patient Active Problem List  ° Diagnosis Date Noted  ° Absence of bladder sensation 10/24/2020  ° SUI (stress urinary incontinence, female) 10/24/2020  ° Sensorineural hearing loss (SNHL) of both ears 02/14/2020  ° Genetic testing 11/27/2018  ° Family history of breast cancer   ° Family history of melanoma   ° Family history of lung cancer   ° Family history of stomach cancer   ° Family history of multiple myeloma   ° Malignant neoplasm of upper-outer quadrant of left breast in female, estrogen receptor negative ([facility])   ° Varicose veins of bilateral lower extremities with other complications 03/23/2017  ° ° , PT CLT °[phone]  °07/09/2021, 4:16 PM ° °[place] °[facility] °[address] °[place], , [address] °Phone: [phone]   Fax:  [phone] ° °Name: Carol Byrd °MRN: [id number] °Date of Birth: 11/14/1945 ° ° ° °

## 2021-07-10 ENCOUNTER — Ambulatory Visit
Admission: RE | Admit: 2021-07-10 | Discharge: 2021-07-10 | Disposition: A | Discharge status: Home / Self Care | Payer: Medicare Other | Source: Ambulatory Visit | Attending: Adult Health | Admitting: Adult Health

## 2021-07-10 DIAGNOSIS — R922 Inconclusive mammogram: Secondary | ICD-10-CM | POA: Diagnosis not present

## 2021-07-10 DIAGNOSIS — Z171 Estrogen receptor negative status [ER-]: Secondary | ICD-10-CM

## 2021-07-10 DIAGNOSIS — C50412 Malignant neoplasm of upper-outer quadrant of left female breast: Secondary | ICD-10-CM

## 2021-07-10 DIAGNOSIS — C50912 Malignant neoplasm of unspecified site of left female breast: Secondary | ICD-10-CM | POA: Diagnosis not present

## 2021-07-11 ENCOUNTER — Encounter (HOSPITAL_COMMUNITY): Payer: Medicare Other | Admitting: Physical Therapy

## 2021-07-12 ENCOUNTER — Ambulatory Visit (HOSPITAL_COMMUNITY): Payer: Medicare Other | Admitting: Physical Therapy

## 2021-07-16 ENCOUNTER — Encounter (HOSPITAL_COMMUNITY): Payer: Medicare Other | Admitting: Physical Therapy

## 2021-07-18 ENCOUNTER — Encounter (HOSPITAL_COMMUNITY): Payer: Medicare Other | Admitting: Physical Therapy

## 2021-07-23 ENCOUNTER — Encounter (HOSPITAL_COMMUNITY): Payer: Medicare Other | Admitting: Physical Therapy

## 2021-07-24 NOTE — Progress Notes (Signed)
Patient Care Team: [name], MD as PCP - General (Internal Medicine) Satira Sark, MD as PCP - Cardiology (Cardiology) Gery Pray, MD as Consulting Physician (Radiation Oncology) Nicholas Lose, MD as Consulting Physician (Hematology and Oncology) Rolm Bookbinder, MD as Consulting Physician (General Surgery)  DIAGNOSIS:    ICD-10-CM   1. Malignant neoplasm of upper-outer quadrant of left breast in female, estrogen receptor negative ([place])  C50.412    Z17.1       SUMMARY OF ONCOLOGIC HISTORY: Oncology History  Malignant neoplasm of upper-outer quadrant of left breast in female, estrogen receptor negative ([place])  11/08/2018 Initial Diagnosis   Routine screening mammogram detected 2 masses in the left breast, measuring 1.6cm and 1.1cm, spanning a total of 3cm. Biopsy confirmed IDC with DCIS, HER-2 negative by FISH (2+ by IHC), ER/PR negative, Ki67 80%.    11/18/2018 Breast MRI   2.9 centimeter bilobed mass in the UOQ of the left breast, consistent with known malignancy. 0.5 centimeter satellite nodule is 0.5 centimeters below this mass.   11/22/2018 Cancer Staging   Staging form: Breast, AJCC 8th Edition - Clinical stage from 11/22/2018: Stage IIB (cT2, cN0, cM0, G3, ER-, PR-, HER2-)    11/27/2018 Genetic Testing   Patient had genetic testing done for a personal and family history of breast cancer.  Results were negative on the [facility] Breast cancer STAT gene panel. The STAT Breast cancer panel offered by [facility] includes sequencing and rearrangement analysis for the following 7 genes:  BRCA1, BRCA2, CDH1, PALB2, PTEN, STK11 and TP53.  The report date is 11/27/2018.   12/01/2018 - 01/25/2019 Chemotherapy   DOXOrubicin (ADRIAMYCIN) chemo injection 114 mg, 60 mg/m2 = 114 mg, Intravenous,  Once, 4 of 4 cycles. Dose modification: 50 mg/m2 (original dose 60 mg/m2, Cycle 2, Reason: Dose not tolerated), 40 mg/m2 (original dose 60 mg/m2, Cycle 4, Reason: Dose not tolerated).  Administration: 114 mg (12/01/2018), 96 mg (12/15/2018), 96 mg (12/29/2018), 76 mg (01/12/2019)  palonosetron (ALOXI) injection 0.25 mg, 0.25 mg, Intravenous,  Once, 5 of 8 cycles. Administration: 0.25 mg (12/01/2018), 0.25 mg (01/25/2019), 0.25 mg (12/15/2018), 0.25 mg (12/29/2018), 0.25 mg (01/12/2019)  pegfilgrastim-jmdb (FULPHILA) injection 6 mg, 6 mg, Subcutaneous,  Once, 4 of 4 cycles. Administration: 6 mg (12/03/2018), 6 mg (12/17/2018), 6 mg (12/31/2018), 6 mg (01/14/2019)  CARBOplatin (PARAPLATIN) 510 mg in sodium chloride 0.9 % 250 mL chemo infusion, 510 mg (106.9 % of original dose 478.8 mg), Intravenous,  Once, 1 of 4 cycles. Dose modification: (original dose 478.8 mg, Cycle 5). Administration: 510 mg (01/25/2019)  cyclophosphamide (CYTOXAN) 1,140 mg in sodium chloride 0.9 % 250 mL chemo infusion, 600 mg/m2 = 1,140 mg, Intravenous,  Once, 4 of 4 cycles. Dose modification: 500 mg/m2 (original dose 600 mg/m2, Cycle 2, Reason: Dose not tolerated), 400 mg/m2 (original dose 600 mg/m2, Cycle 4, Reason: Dose not tolerated). Administration: 1,140 mg (12/01/2018), 960 mg (12/15/2018), 960 mg (12/29/2018), 760 mg (01/12/2019)  PACLitaxel (TAXOL) 150 mg in sodium chloride 0.9 % 250 mL chemo infusion (</= 3m/m2), 80 mg/m2 = 150 mg, Intravenous,  Once, 1 of 4 cycles Administration: 150 mg (01/25/2019)  fosaprepitant (EMEND) 150 mg   dexamethasone (DECADRON) 12 mg in sodium chloride 0.9 % 145 mL IVPB, , Intravenous,  Once, 5 of 8 cycles Administration:  (12/01/2018),  (01/25/2019),  (12/15/2018),  (12/29/2018),  (01/12/2019)   03/29/2019 Surgery   Left lumpectomy ([name] [phone]: scattered microscopic foci of high grade DCIS with no invasive carcinoma, clear margins, 2 left axillary lymph  nodes negative.   03/29/2019 Cancer Staging   Staging form: Breast, AJCC 8th Edition - Pathologic stage from 03/29/2019: No Stage Recommended (ypTis (DCIS), pN0, cM0)    05/03/2019 - 05/31/2019 Radiation Therapy   The  patient initially received a dose of 40.05 Gy in 15 fractions to the breast using whole-breast tangent fields. This was delivered using a 3-D conformal technique. The pt received a boost delivering an additional 10 Gy in 5 fractions using a electron boost with 39mV electrons. The total dose was 50.05 Gy.      CHIEF COMPLIANT: Follow-up of triple negative left breast cancer  INTERVAL HISTORY: Carol Wainwrightis a 76y.o. with above-mentioned history of triple negative left breast cancer treated with neoadjuvant chemotherapy, left lumpectomy, radiation, and is currently on surveillance. She presents to the clinic today for follow-up.  Her major complaint today is decreased memory.  This is attributable to chemotherapy.  It is affecting her quality of life.  ALLERGIES:  is allergic to carboplatin and tuna oil [fish oil].  MEDICATIONS:  Current Outpatient Medications  Medication Sig Dispense Refill   atorvastatin (LIPITOR) 20 MG tablet Take 1 tablet (20 mg total) by mouth daily. 90 tablet 3   Cholecalciferol (VITAMIN D) 50 MCG (2000 UT) CAPS Take 2,000 Units by mouth daily.     escitalopram (LEXAPRO) 10 MG tablet Take 10 mg by mouth daily.     ezetimibe (ZETIA) 10 MG tablet Take 10 mg by mouth at bedtime.     levothyroxine (SYNTHROID) 75 MCG tablet Take 75 mcg by mouth daily before breakfast.     telmisartan (MICARDIS) 40 MG tablet Take 40 mg by mouth daily. (Patient not taking: Reported on 06/21/2021)     No current facility-administered medications for this visit.    PHYSICAL EXAMINATION: ECOG PERFORMANCE STATUS: 1 - Symptomatic but completely ambulatory  Vitals:   07/25/21 0958  BP: (!) 158/64  Pulse: 74  Resp: 17  Temp: (!) 97.3 F (36.3 C)  SpO2: 97%   Filed Weights   07/25/21 0958  Weight: 164 lb 9.6 oz (74.7 kg)    BREAST: No palpable masses or nodules in either right or left breasts. No palpable axillary supraclavicular or infraclavicular adenopathy no breast  tenderness or nipple discharge. (exam performed in the presence of a chaperone)  LABORATORY DATA:  I have reviewed the data as listed CMP Latest Ref Rng & Units 07/03/2020 03/14/2019 02/23/2019  Glucose 70 - 99 mg/dL - 85 116(H)  BUN 8 - 23 mg/dL - 27(H) 19  Creatinine 0.44 - 1.00 mg/dL 1.20(H) 1.19(H) 0.92  Sodium 135 - 145 mmol/L - 142 141  Potassium 3.5 - 5.1 mmol/L - 4.4 3.9  Chloride 98 - 111 mmol/L - 105 107  CO2 22 - 32 mmol/L - 24 24  Calcium 8.9 - 10.3 mg/dL - 9.2 8.7(L)  Total Protein 6.5 - 8.1 g/dL - 7.0 6.7  Total Bilirubin 0.3 - 1.2 mg/dL - 0.5 0.6  Alkaline Phos 38 - 126 U/L - 57 56  AST 15 - 41 U/L - 16 16  ALT 0 - 44 U/L - 14 13    Lab Results  Component Value Date   WBC 4.1 03/14/2019   HGB 9.9 (L) 03/14/2019   HCT 29.4 (L) 03/14/2019   MCV 106.9 (H) 03/14/2019   PLT 192 03/14/2019   NEUTROABS 2.4 03/14/2019    ASSESSMENT & PLAN:  Malignant neoplasm of upper-outer quadrant of left breast in female, estrogen receptor negative ([place] /  22/2020: Routine screening mammogram detected 2 masses in the left breast, measuring 1.6cm and 1.1cm, spanning a total of 3cm. Biopsy confirmed IDC with DCIS, HER-2 negative by FISH (2+ by IHC), ER/PR negative, Ki67 80%.  Staging: T2N0 stage IIb clinical stage Breast MRI: 11/18/2018: 2.9 cm bilobed mass UOQ left breast   Treatment plan 1.  Neoadjuvant chemotherapy with dose dense Adriamycin and Cytoxan x4 followed by Taxol and carboplatin completed 01/25/2019 2. 05/28/18: Left lumpectomy [name]): scattered microscopic foci of high grade DCIS with no invasive carcinoma, clear margins, 2 left axillary lymph nodes negative. 2.  Followed by adjuvant radiation 05/03/2019-05/24/2019 -------------------------------------------------------------------------------------------------- Treatment plan: Surveillance 1.  07/10/2021: Mammogram and Ultrasound: Benign 2. Breast Exam: 07/25/21: Benign  They have Havanese dogs and enjoy spending time with  them.  Memory issues: Due to chemotherapy: I prescribed Aricept today. Return to clinic in 1 year for follow-up  No orders of the defined types were placed in this encounter.  The patient has a good understanding of the overall plan. she agrees with it. she will call with any problems that may develop before the next visit here.  Total time spent: 30 mins including face to face time and time spent for planning, charting and coordination of care  Rulon Eisenmenger, MD, MPH 07/25/2021  I, Thana Ates, am acting as scribe for Dr. Nicholas Lose.  I have reviewed the above documentation for accuracy and completeness, and I agree with the above.

## 2021-07-24 NOTE — Assessment & Plan Note (Signed)
/  22/2020: Routine screening mammogram detected 2 masses in the left breast, measuring 1.6cm and 1.1cm, spanning a total of 3cm. Biopsy confirmed IDC with DCIS, HER-2 negative by FISH (2+ by IHC), ER/PR negative, Ki67 80%.  ?Staging: T2N0 stage IIb clinical stage ?Breast MRI: 11/18/2018: 2.9 cm bilobed mass UOQ left breast ?? ?Treatment plan ?1.??Neoadjuvant chemotherapy with dose dense Adriamycin and Cytoxan x4 followed by Taxol and carboplatin?completed 01/25/2019 ?2.?05/28/18:?Left lumpectomy [name]): scattered microscopic foci of high grade DCIS with no invasive carcinoma, clear margins, 2 left axillary lymph nodes negative. ?2.??Followed by adjuvant radiation?05/03/2019-05/24/2019 ?-------------------------------------------------------------------------------------------------- ?Treatment plan:?Surveillance ?1. Mammogram and Ultrasound: Benign ?2. Breast Exam: 07/25/21: Benign ?? ?

## 2021-07-25 ENCOUNTER — Other Ambulatory Visit: Payer: Self-pay

## 2021-07-25 ENCOUNTER — Encounter (HOSPITAL_COMMUNITY): Payer: Medicare Other | Admitting: Physical Therapy

## 2021-07-25 ENCOUNTER — Inpatient Hospital Stay: Payer: Medicare Other | Attending: Adult Health | Admitting: Hematology and Oncology

## 2021-07-25 DIAGNOSIS — Z171 Estrogen receptor negative status [ER-]: Secondary | ICD-10-CM | POA: Insufficient documentation

## 2021-07-25 DIAGNOSIS — Z923 Personal history of irradiation: Secondary | ICD-10-CM | POA: Diagnosis not present

## 2021-07-25 DIAGNOSIS — Z9221 Personal history of antineoplastic chemotherapy: Secondary | ICD-10-CM | POA: Diagnosis not present

## 2021-07-25 DIAGNOSIS — R413 Other amnesia: Secondary | ICD-10-CM | POA: Diagnosis not present

## 2021-07-25 DIAGNOSIS — Z79899 Other long term (current) drug therapy: Secondary | ICD-10-CM | POA: Diagnosis not present

## 2021-07-25 DIAGNOSIS — C50412 Malignant neoplasm of upper-outer quadrant of left female breast: Secondary | ICD-10-CM | POA: Insufficient documentation

## 2021-07-25 MED ORDER — DONEPEZIL HCL 5 MG PO TABS: 5.0000 mg | ORAL_TABLET | Freq: Every day | ORAL | 3 refills | Status: DC

## 2021-07-30 ENCOUNTER — Encounter (HOSPITAL_COMMUNITY): Payer: Medicare Other | Admitting: Physical Therapy

## 2021-08-01 ENCOUNTER — Encounter (HOSPITAL_COMMUNITY): Payer: Medicare Other | Admitting: Physical Therapy

## 2021-08-06 ENCOUNTER — Encounter (HOSPITAL_COMMUNITY): Payer: Medicare Other | Admitting: Physical Therapy

## 2021-08-06 ENCOUNTER — Encounter: Payer: Self-pay | Admitting: Hematology and Oncology

## 2021-08-08 ENCOUNTER — Encounter (HOSPITAL_COMMUNITY): Payer: Medicare Other | Admitting: Physical Therapy

## 2021-08-08 ENCOUNTER — Other Ambulatory Visit: Payer: Self-pay

## 2021-08-08 DIAGNOSIS — C50412 Malignant neoplasm of upper-outer quadrant of left female breast: Secondary | ICD-10-CM

## 2021-08-13 ENCOUNTER — Encounter (HOSPITAL_COMMUNITY): Payer: Medicare Other | Admitting: Physical Therapy

## 2021-08-15 ENCOUNTER — Encounter (HOSPITAL_COMMUNITY): Payer: Medicare Other | Admitting: Physical Therapy

## 2021-08-19 ENCOUNTER — Other Ambulatory Visit: Payer: Self-pay | Admitting: Cardiology

## 2021-09-23 ENCOUNTER — Other Ambulatory Visit: Payer: Self-pay | Admitting: Cardiology

## 2021-10-02 DIAGNOSIS — N1831 Chronic kidney disease, stage 3a: Secondary | ICD-10-CM | POA: Diagnosis not present

## 2021-10-08 ENCOUNTER — Other Ambulatory Visit: Payer: Self-pay | Admitting: Cardiology

## 2021-10-08 DIAGNOSIS — N183 Chronic kidney disease, stage 3 unspecified: Secondary | ICD-10-CM | POA: Diagnosis not present

## 2021-10-08 DIAGNOSIS — R413 Other amnesia: Secondary | ICD-10-CM | POA: Diagnosis not present

## 2021-10-08 DIAGNOSIS — I10 Essential (primary) hypertension: Secondary | ICD-10-CM | POA: Diagnosis not present

## 2021-10-22 DIAGNOSIS — Z961 Presence of intraocular lens: Secondary | ICD-10-CM | POA: Diagnosis not present

## 2021-10-28 ENCOUNTER — Other Ambulatory Visit: Payer: Self-pay | Admitting: Cardiology

## 2021-11-05 ENCOUNTER — Other Ambulatory Visit: Payer: Self-pay | Admitting: Internal Medicine

## 2021-11-05 DIAGNOSIS — Z1231 Encounter for screening mammogram for malignant neoplasm of breast: Secondary | ICD-10-CM

## 2021-11-25 ENCOUNTER — Ambulatory Visit
Admission: RE | Admit: 2021-11-25 | Discharge: 2021-11-25 | Disposition: A | Discharge status: Home / Self Care | Payer: Medicare Other | Source: Ambulatory Visit | Attending: Internal Medicine | Admitting: Internal Medicine

## 2021-11-25 DIAGNOSIS — Z1231 Encounter for screening mammogram for malignant neoplasm of breast: Secondary | ICD-10-CM | POA: Diagnosis not present

## 2021-12-03 DIAGNOSIS — L658 Other specified nonscarring hair loss: Secondary | ICD-10-CM | POA: Diagnosis not present

## 2021-12-03 DIAGNOSIS — L821 Other seborrheic keratosis: Secondary | ICD-10-CM | POA: Diagnosis not present

## 2021-12-03 DIAGNOSIS — D1801 Hemangioma of skin and subcutaneous tissue: Secondary | ICD-10-CM | POA: Diagnosis not present

## 2021-12-03 DIAGNOSIS — L720 Epidermal cyst: Secondary | ICD-10-CM | POA: Diagnosis not present

## 2022-02-04 DIAGNOSIS — Z79899 Other long term (current) drug therapy: Secondary | ICD-10-CM | POA: Diagnosis not present

## 2022-02-04 DIAGNOSIS — I10 Essential (primary) hypertension: Secondary | ICD-10-CM | POA: Diagnosis not present

## 2022-02-04 DIAGNOSIS — R413 Other amnesia: Secondary | ICD-10-CM | POA: Diagnosis not present

## 2022-02-04 DIAGNOSIS — N1831 Chronic kidney disease, stage 3a: Secondary | ICD-10-CM | POA: Diagnosis not present

## 2022-02-11 DIAGNOSIS — Z23 Encounter for immunization: Secondary | ICD-10-CM | POA: Diagnosis not present

## 2022-02-11 DIAGNOSIS — I10 Essential (primary) hypertension: Secondary | ICD-10-CM | POA: Diagnosis not present

## 2022-02-11 DIAGNOSIS — N183 Chronic kidney disease, stage 3 unspecified: Secondary | ICD-10-CM | POA: Diagnosis not present

## 2022-02-11 DIAGNOSIS — R413 Other amnesia: Secondary | ICD-10-CM | POA: Diagnosis not present

## 2022-03-12 DIAGNOSIS — R5382 Chronic fatigue, unspecified: Secondary | ICD-10-CM | POA: Diagnosis not present

## 2022-03-29 ENCOUNTER — Encounter (INDEPENDENT_AMBULATORY_CARE_PROVIDER_SITE_OTHER): Payer: Self-pay | Admitting: Gastroenterology

## 2022-06-18 DIAGNOSIS — Z79899 Other long term (current) drug therapy: Secondary | ICD-10-CM | POA: Diagnosis not present

## 2022-06-18 DIAGNOSIS — I10 Essential (primary) hypertension: Secondary | ICD-10-CM | POA: Diagnosis not present

## 2022-06-18 DIAGNOSIS — I700 Atherosclerosis of aorta: Secondary | ICD-10-CM | POA: Diagnosis not present

## 2022-06-18 DIAGNOSIS — E785 Hyperlipidemia, unspecified: Secondary | ICD-10-CM | POA: Diagnosis not present

## 2022-06-18 DIAGNOSIS — N1831 Chronic kidney disease, stage 3a: Secondary | ICD-10-CM | POA: Diagnosis not present

## 2022-06-18 DIAGNOSIS — C50912 Malignant neoplasm of unspecified site of left female breast: Secondary | ICD-10-CM | POA: Diagnosis not present

## 2022-06-26 DIAGNOSIS — E039 Hypothyroidism, unspecified: Secondary | ICD-10-CM | POA: Diagnosis not present

## 2022-06-26 DIAGNOSIS — Z853 Personal history of malignant neoplasm of breast: Secondary | ICD-10-CM | POA: Diagnosis not present

## 2022-06-26 DIAGNOSIS — R413 Other amnesia: Secondary | ICD-10-CM | POA: Diagnosis not present

## 2022-06-26 DIAGNOSIS — N1831 Chronic kidney disease, stage 3a: Secondary | ICD-10-CM | POA: Diagnosis not present

## 2022-06-26 DIAGNOSIS — I700 Atherosclerosis of aorta: Secondary | ICD-10-CM | POA: Diagnosis not present

## 2022-06-26 DIAGNOSIS — E785 Hyperlipidemia, unspecified: Secondary | ICD-10-CM | POA: Diagnosis not present

## 2022-06-26 DIAGNOSIS — I10 Essential (primary) hypertension: Secondary | ICD-10-CM | POA: Diagnosis not present

## 2022-07-06 ENCOUNTER — Encounter (HOSPITAL_COMMUNITY): Payer: Self-pay

## 2022-07-06 ENCOUNTER — Other Ambulatory Visit: Payer: Self-pay

## 2022-07-06 ENCOUNTER — Emergency Department (HOSPITAL_COMMUNITY)
Admission: EM | Admit: 2022-07-06 | Discharge: 2022-07-06 | Disposition: A | Discharge status: Home / Self Care | Payer: Medicare Other | Attending: Emergency Medicine | Admitting: Emergency Medicine

## 2022-07-06 ENCOUNTER — Emergency Department (HOSPITAL_COMMUNITY): Payer: Medicare Other

## 2022-07-06 DIAGNOSIS — R55 Syncope and collapse: Secondary | ICD-10-CM | POA: Diagnosis not present

## 2022-07-06 DIAGNOSIS — R4189 Other symptoms and signs involving cognitive functions and awareness: Secondary | ICD-10-CM | POA: Diagnosis present

## 2022-07-06 DIAGNOSIS — R252 Cramp and spasm: Secondary | ICD-10-CM | POA: Diagnosis not present

## 2022-07-06 LAB — CBC WITH DIFFERENTIAL/PLATELET
Abs Immature Granulocytes: 0.02 10*3/uL (ref 0.00–0.07)
Basophils Absolute: 0.1 10*3/uL (ref 0.0–0.1)
Basophils Relative: 1 %
Eosinophils Absolute: 0.2 10*3/uL (ref 0.0–0.5)
Eosinophils Relative: 3 %
HCT: 33.7 % — ABNORMAL LOW (ref 36.0–46.0)
Hemoglobin: 11.3 g/dL — ABNORMAL LOW (ref 12.0–15.0)
Immature Granulocytes: 0 %
Lymphocytes Relative: 19 %
Lymphs Abs: 1.4 10*3/uL (ref 0.7–4.0)
MCH: 35 pg — ABNORMAL HIGH (ref 26.0–34.0)
MCHC: 33.5 g/dL (ref 30.0–36.0)
MCV: 104.3 fL — ABNORMAL HIGH (ref 80.0–100.0)
Monocytes Absolute: 0.7 10*3/uL (ref 0.1–1.0)
Monocytes Relative: 9 %
Neutro Abs: 4.9 10*3/uL (ref 1.7–7.7)
Neutrophils Relative %: 68 %
Platelets: 177 10*3/uL (ref 150–400)
RBC: 3.23 MIL/uL — ABNORMAL LOW (ref 3.87–5.11)
RDW: 12.4 % (ref 11.5–15.5)
WBC: 7.2 10*3/uL (ref 4.0–10.5)
nRBC: 0 % (ref 0.0–0.2)

## 2022-07-06 LAB — COMPREHENSIVE METABOLIC PANEL
ALT: 22 U/L (ref 0–44)
AST: 20 U/L (ref 15–41)
Albumin: 3.4 g/dL — ABNORMAL LOW (ref 3.5–5.0)
Alkaline Phosphatase: 57 U/L (ref 38–126)
Anion gap: 7 (ref 5–15)
BUN: 28 mg/dL — ABNORMAL HIGH (ref 8–23)
CO2: 26 mmol/L (ref 22–32)
Calcium: 8.1 mg/dL — ABNORMAL LOW (ref 8.9–10.3)
Chloride: 103 mmol/L (ref 98–111)
Creatinine, Ser: 1.35 mg/dL — ABNORMAL HIGH (ref 0.44–1.00)
GFR, Estimated: 40 mL/min — ABNORMAL LOW (ref >60)
Glucose, Bld: 119 mg/dL — ABNORMAL HIGH (ref 70–99)
Potassium: 4 mmol/L (ref 3.5–5.1)
Sodium: 136 mmol/L (ref 135–145)
Total Bilirubin: 0.6 mg/dL (ref 0.3–1.2)
Total Protein: 6.2 g/dL — ABNORMAL LOW (ref 6.5–8.1)

## 2022-07-06 LAB — MAGNESIUM: Magnesium: 1.8 mg/dL (ref 1.7–2.4)

## 2022-07-06 LAB — LACTIC ACID, PLASMA: Lactic Acid, Venous: 1.5 mmol/L (ref 0.5–1.9)

## 2022-07-06 MED ORDER — LACTATED RINGERS IV BOLUS
1000.0000 mL | Freq: Once | INTRAVENOUS | Status: AC
  Administered 2022-07-06: 1000 mL via INTRAVENOUS

## 2022-07-06 NOTE — ED Triage Notes (Signed)
Pt bib spouse from home with c/o syncope. Pt says she got up to use the bathroom, remembers sitting on toilet having BM then woke up on floor. Pt spouse says he found pt wedged between toilet and wall and was not responsive for a couple of minutes, spouse moved pt to middle of floor and pt came around and was able to get up by self. Pt alert and oriented x 4 at this time. Pt ambulatory in [place], instructed pt to get in [facility] to be taken to tx room. Spouse with pt. Pt denies pain.

## 2022-07-06 NOTE — ED Notes (Signed)
Pt given water per DR sheldon ok and pt request

## 2022-07-06 NOTE — ED Provider Notes (Signed)
[facility]  Provider Note  CSN: [id number] Arrival date & time: 07/06/22 4098  History Chief Complaint  Patient presents with   Loss of Consciousness    Carol Byrd is a 77 y.o. female brought to the ED by husband who is at bedside and supplements history. Patient repots she has been having leg spasms at night and got up to walk one off and go to the rest room earlier this evening. Husband heard a thud and found her wedged between the toilet and the wall unresponsive. He assisted her to the floor where he reports she was unconscious for 3-4 minutes with what sounds like myoclonus. She eventually regained consciousness and EMS was called to help assist her off the floor. They reported a normal BP, but she refused EMS transport. Noted to be hypotensive on arrival here. She denies any recent specific illness, no fever, cough, SOB, N/V/D or dysuria although she reports some decreased energy and general malaise recently as well as some R lower back soreness. No known CAD. Denies any head injury, weakness, numbness, or facial droop. She is back to baseline now.    Home Medications Prior to Admission medications   Medication Sig Start Date End Date Taking? Authorizing Provider  atorvastatin (LIPITOR) 20 MG tablet TAKE ONE TABLET (20MG  TOTAL) BY MOUTH DAILY 10/29/21   Satira Sark, MD  Cholecalciferol (VITAMIN D) 50 MCG (2000 UT) CAPS Take 2,000 Units by mouth daily.    [provider]  donepezil (ARICEPT) 5 MG tablet Take 1 tablet (5 mg total) by mouth at bedtime. 07/25/21   Nicholas Lose, MD  escitalopram (LEXAPRO) 10 MG tablet Take 10 mg by mouth daily.    [provider]  ezetimibe (ZETIA) 10 MG tablet Take 10 mg by mouth at bedtime. 09/07/18   [provider]  levothyroxine (SYNTHROID) 75 MCG tablet Take 75 mcg by mouth daily before breakfast. 04/11/19   [provider]  telmisartan (MICARDIS) 40 MG  tablet Take 40 mg by mouth daily. Patient not taking: Reported on 06/21/2021 08/31/18   [provider]  prochlorperazine (COMPAZINE) 10 MG tablet TAKE ONE TABLET (10MG  TOTAL) BY MOUTH EVERY SIX HOURS AS NEEDED FOR NAUSEA OR VOMITING 01/17/19 02/23/19  Nicholas Lose, MD     Allergies    Carboplatin and Tuna oil [fish oil]   Review of Systems   Review of Systems Please see HPI for pertinent positives and negatives  Physical Exam BP 127/64   Pulse 72   Temp 98 F (36.7 C) (Oral)   Resp 12   Ht 5\' 5"  (1.651 m)   Wt 68 kg   SpO2 93%   BMI 24.96 kg/m   Physical Exam Vitals and nursing note reviewed.  Constitutional:      Appearance: Normal appearance.  HENT:     Head: Normocephalic and atraumatic.     Nose: Nose normal.     Mouth/Throat:     Mouth: Mucous membranes are moist.  Eyes:     Extraocular Movements: Extraocular movements intact.     Conjunctiva/sclera: Conjunctivae normal.  Cardiovascular:     Rate and Rhythm: Normal rate.  Pulmonary:     Effort: Pulmonary effort is normal.     Breath sounds: Normal breath sounds.  Abdominal:     General: Abdomen is flat.     Palpations: Abdomen is soft.     Tenderness: There is no abdominal tenderness.  Musculoskeletal:  General: Tenderness (R lumbar paraspinal muscles, no midline tenderness) present. No swelling. Normal range of motion.     Cervical back: Neck supple.  Skin:    General: Skin is warm and dry.  Neurological:     General: No focal deficit present.     Mental Status: She is alert.  Psychiatric:        Mood and Affect: Mood normal.     ED Results / Procedures / Treatments   EKG EKG Interpretation  Date/Time:  Sunday July 06 2022 01:02:27 EST Ventricular Rate:  68 PR Interval:  43 QRS Duration: 98 QT Interval:  427 QTC Calculation: 455 R Axis:   39 Text Interpretation: Sinus rhythm Short PR interval Anteroseptal infarct, age indeterminate LOW VOLTAGE No significant change since  last tracing Confirmed by Calvert Cantor [phone]) on 07/06/2022 1:17:45 AM  Procedures Procedures  Medications Ordered in the ED Medications  lactated ringers bolus 1,000 mL (0 mLs Intravenous Stopped 07/06/22 0300)    Initial Impression and Plan  Patient here after a syncopal episode with borderline low BP on arrival. Consider vasovagal, dehydration, blood loss anemia. No infectious symptoms to suggest sepsis. Will check labs, CXR, and give IVF.   ED Course   Clinical Course as of 07/06/22 0318  Nancy Fetter Jul 06, 2022  0156 CBC with mild anemia, improved from previous. WBC is normal.  [CS]  0156 I personally viewed the images from radiology studies and agree with radiologist interpretation: CXR is clear [CS]  0217 CMP with mildly increased Cr from prior baseline. Otherwise unremarkable. [CS]  0217 Magnesium is normal.  [CS]  0219 Lactic acid is normal.  [CS]  3818 Patient's BP improved. Discussed admission for unexplained syncope but she reports she is feeling better and wants to go home. Recommend she rest, drink plenty of fluids and follow up with PCP. RTED for any other concerns.  [CS]    Clinical Course User Index [CS] Truddie Hidden, MD     MDM Rules/Calculators/A&P Medical Decision Making Problems Addressed: Syncope, unspecified syncope type: acute illness or injury  Amount and/or Complexity of Data Reviewed Labs: ordered. Decision-making details documented in ED Course. Radiology: ordered and independent interpretation performed. Decision-making details documented in ED Course. ECG/medicine tests: ordered and independent interpretation performed. Decision-making details documented in ED Course.  Risk Decision regarding hospitalization.     Final Clinical Impression(s) / ED Diagnoses Final diagnoses:  Syncope, unspecified syncope type    Rx / DC Orders ED Discharge Orders     None        Truddie Hidden, MD 07/06/22 [phone]

## 2022-07-06 NOTE — ED Notes (Signed)
Pt reports she has had syncopal episodes in the past when she has bad cramps.

## 2022-07-07 ENCOUNTER — Other Ambulatory Visit (HOSPITAL_COMMUNITY): Payer: Self-pay | Admitting: Internal Medicine

## 2022-07-07 DIAGNOSIS — M25551 Pain in right hip: Secondary | ICD-10-CM

## 2022-07-08 ENCOUNTER — Ambulatory Visit (HOSPITAL_COMMUNITY)
Admission: RE | Admit: 2022-07-08 | Discharge: 2022-07-08 | Disposition: A | Discharge status: Home / Self Care | Payer: Medicare Other | Source: Ambulatory Visit | Attending: Internal Medicine | Admitting: Internal Medicine

## 2022-07-08 DIAGNOSIS — M25551 Pain in right hip: Secondary | ICD-10-CM | POA: Diagnosis not present

## 2022-07-10 NOTE — Progress Notes (Signed)
Cardiology Office Note  Date: 07/11/2022   ID: Ayram, Belue 1945-06-12, MRN [id number]  PCP:  [name], MD  Cardiologist:  Rozann Lesches, MD Electrophysiologist:  None   Chief Complaint  Patient presents with   Cardiac follow-up    History of Present Illness: Carol Byrd is a 77 y.o. female last seen in March 2022.  She is here today with her husband for a follow-up visit.  Reports no exertional chest pain or unusual breathlessness with typical activities.  She was seen in the ER recently after an episode of syncope that occurred at home.  Description of the event is very consistent with a vasovagal episode, occurred after she used the bathroom and stood up.  Similar event actually happened in the past.  Workup in the ER showed no acute findings and she has been back to baseline, trying to hydrate somewhat more regularly.  I did review her most recent lab work.  LDL looks fairly good at 68, HDL 67.  At this time she is on Lipitor 20 mg daily and Zetia 10 mg daily.  I talked with her about considering an increase in Lipitor to 40 mg daily and stop Zetia.  Suspect this would get her LDL to goal as well.  Ischemic and structural cardiac workup from 2022 was reassuring.  I reviewed her recent ECG.  Past Medical History:  Diagnosis Date   Abnormal CT scan, stomach 07/26/2020   Achilles tendonitis 10/04/2013   Alopecia    Breast cancer ([place])    Left   Cervical disc disease    Essential hypertension    Family history of breast cancer    Family history of lung cancer    Family history of melanoma    Family history of multiple myeloma    Family history of stomach cancer    GERD (gastroesophageal reflux disease)    Hypothyroidism    Impaired fasting glucose    Metatarsal fracture 03/13/2011   Mixed hyperlipidemia    Personal history of chemotherapy    Personal history of radiation therapy    Port-A-Cath in place 12/01/2018   Vitamin D deficiency      Current Outpatient Medications  Medication Sig Dispense Refill   atorvastatin (LIPITOR) 40 MG tablet Take 1 tablet (40 mg total) by mouth daily. 90 tablet 3   Cholecalciferol (VITAMIN D) 50 MCG (2000 UT) CAPS Take 2,000 Units by mouth daily.     donepezil (ARICEPT) 5 MG tablet Take 1 tablet (5 mg total) by mouth at bedtime. 90 tablet 3   escitalopram (LEXAPRO) 10 MG tablet Take 10 mg by mouth daily.     levothyroxine (SYNTHROID) 75 MCG tablet Take 75 mcg by mouth daily before breakfast.     telmisartan (MICARDIS) 40 MG tablet Take 40 mg by mouth daily.     No current facility-administered medications for this visit.   Allergies:  Carboplatin   ROS: Trouble with short-term memory.  Physical Exam: VS:  BP 111/68   Pulse 68   Ht 5' 4.5" (1.638 m)   Wt 154 lb 9.6 oz (70.1 kg)   SpO2 93%   BMI 26.13 kg/m , BMI Body mass index is 26.13 kg/m.  Wt Readings from Last 3 Encounters:  07/11/22 154 lb 9.6 oz (70.1 kg)  07/06/22 150 lb (68 kg)  07/25/21 164 lb 9.6 oz (74.7 kg)    General: Patient appears comfortable at rest. HEENT: Conjunctiva and lids normal. Neck: Supple, no elevated JVP  or carotid bruits. Lungs: Clear to auscultation, nonlabored breathing at rest. Cardiac: Regular rate and rhythm, no S3 or significant systolic murmur. Extremities: No pitting edema.  ECG:  An ECG dated 07/06/2022 was personally reviewed today and demonstrated:  Sinus rhythm, R' in lead V1 and V2.  Recent Labwork: January 2024: Cholesterol 154, triglycerides 106, HDL 67, LDL 68, TSH 2.74, hemoglobin A1c 5.6% 07/06/2022: ALT 22; AST 20; BUN 28; Creatinine, Ser 1.35; Hemoglobin 11.3; Magnesium 1.8; Platelets 177; Potassium 4.0; Sodium 136   Other Studies Reviewed Today:  Echocardiogram 07/18/2020: 1. Left ventricular ejection fraction, by estimation, is 60 to 65%. The  left ventricle has normal function. The left ventricle has no regional  wall motion abnormalities. Left ventricular diastolic  parameters are  consistent with Grade I diastolic  dysfunction (impaired relaxation).   2. Right ventricular systolic function is normal. The right ventricular  size is normal.   3. The mitral valve is normal in structure. No evidence of mitral valve  regurgitation. No evidence of mitral stenosis.   4. The aortic valve is tricuspid. Aortic valve regurgitation is not  visualized. No aortic stenosis is present.   5. The inferior vena cava is normal in size with greater than 50%  respiratory variability, suggesting right atrial pressure of 3 mmHg.   Lexiscan Myoview 08/09/2020: Lexiscan stress is electrically negative for ischemia Myovue scan shows normal perfusion No ischemia or scar LVEF calculated at 75% OVerall low risk scan  Assessment and Plan:  1.  Coronary artery calcification by CT imaging, also aortic atherosclerosis.  She is asymptomatic in terms of angina, recent ECG reviewed.  Cardiac structural and ischemic workup from 2022 was overall reassuring.  Would continue medical therapy and observation.  Plan to stop Zetia and increase Lipitor to 40 mg daily.  Ideally would like to see her LDL 55 or less.  2.  Essential hypertension, on Micardis.  Blood pressure is normal today.  Medication Adjustments/Labs and Tests Ordered: Current medicines are reviewed at length with the patient today.  Concerns regarding medicines are outlined above.   Tests Ordered: No orders of the defined types were placed in this encounter.   Medication Changes: Meds ordered this encounter  Medications   atorvastatin (LIPITOR) 40 MG tablet    Sig: Take 1 tablet (40 mg total) by mouth daily.    Dispense:  90 tablet    Refill:  3    Disposition:  Follow up  1 year.  Signed, Satira Sark, MD, [facility] 07/11/2022 9:53 AM    [facility] [address], [place], [place] [address] Phone: [phone]; Fax: [phone]

## 2022-07-11 ENCOUNTER — Ambulatory Visit: Payer: Medicare Other | Attending: Cardiology | Admitting: Cardiology

## 2022-07-11 ENCOUNTER — Encounter: Payer: Self-pay | Admitting: Cardiology

## 2022-07-11 VITALS — BP 111/68 | HR 68 | Ht 64.5 in | Wt 154.6 lb

## 2022-07-11 DIAGNOSIS — I10 Essential (primary) hypertension: Secondary | ICD-10-CM | POA: Diagnosis not present

## 2022-07-11 DIAGNOSIS — I2510 Atherosclerotic heart disease of native coronary artery without angina pectoris: Secondary | ICD-10-CM

## 2022-07-11 MED ORDER — ATORVASTATIN CALCIUM 40 MG PO TABS: 40.0000 mg | ORAL_TABLET | Freq: Every day | ORAL | 3 refills | Status: AC

## 2022-07-11 NOTE — Patient Instructions (Signed)
Medication Instructions:   STOP Zetia    INCREASE Lipitor to 40 mg daily    Labwork: None toda  Testing/Procedures: None today  Follow-Up: 1 year  Any Other Special Instructions Will Be Listed Below (If Applicable).  If you need a refill on your cardiac medications before your next appointment, please call your pharmacy.

## 2022-07-14 ENCOUNTER — Telehealth: Payer: Self-pay

## 2022-07-14 NOTE — Telephone Encounter (Signed)
     Patient  visit on 07/06/2022  at [facility] was for Loss of Consciousness.  Have you been able to follow up with your primary care physician? Patient stated she is feeling better.  The patient was or was not able to obtain any needed medicine or equipment. No medication prescribed.  Are there diet recommendations that you are having difficulty following? No  Patient expresses understanding of discharge instructions and education provided has no other needs at this time. Yes   [place] Resource Care Guide   ??[email or web address]  ?? [phone]   [email or web address]: [email or web address]  [email or web address]

## 2022-07-25 NOTE — Progress Notes (Signed)
Patient Care Team: [name], MD as PCP - General (Internal Medicine) Satira Sark, MD as PCP - Cardiology (Cardiology) Gery Pray, MD as Consulting Physician (Radiation Oncology) Nicholas Lose, MD as Consulting Physician (Hematology and Oncology) Rolm Bookbinder, MD as Consulting Physician (General Surgery)  DIAGNOSIS: No diagnosis found.  SUMMARY OF ONCOLOGIC HISTORY: Oncology History  Malignant neoplasm of upper-outer quadrant of left breast in female, estrogen receptor negative ([place])  11/08/2018 Initial Diagnosis   Routine screening mammogram detected 2 masses in the left breast, measuring 1.6cm and 1.1cm, spanning a total of 3cm. Biopsy confirmed IDC with DCIS, HER-2 negative by FISH (2+ by IHC), ER/PR negative, Ki67 80%.    11/18/2018 Breast MRI   2.9 centimeter bilobed mass in the UOQ of the left breast, consistent with known malignancy. 0.5 centimeter satellite nodule is 0.5 centimeters below this mass.   11/22/2018 Cancer Staging   Staging form: Breast, AJCC 8th Edition - Clinical stage from 11/22/2018: Stage IIB (cT2, cN0, cM0, G3, ER-, PR-, HER2-)    11/27/2018 Genetic Testing   Patient had genetic testing done for a personal and family history of breast cancer.  Results were negative on the [facility] Breast cancer STAT gene panel. The STAT Breast cancer panel offered by [facility] includes sequencing and rearrangement analysis for the following 7 genes:  BRCA1, BRCA2, CDH1, PALB2, PTEN, STK11 and TP53.  The report date is 11/27/2018.   12/01/2018 - 01/25/2019 Chemotherapy   DOXOrubicin (ADRIAMYCIN) chemo injection 114 mg, 60 mg/m2 = 114 mg, Intravenous,  Once, 4 of 4 cycles. Dose modification: 50 mg/m2 (original dose 60 mg/m2, Cycle 2, Reason: Dose not tolerated), 40 mg/m2 (original dose 60 mg/m2, Cycle 4, Reason: Dose not tolerated). Administration: 114 mg (12/01/2018), 96 mg (12/15/2018), 96 mg (12/29/2018), 76 mg (01/12/2019)  palonosetron (ALOXI) injection 0.25 mg, 0.25  mg, Intravenous,  Once, 5 of 8 cycles. Administration: 0.25 mg (12/01/2018), 0.25 mg (01/25/2019), 0.25 mg (12/15/2018), 0.25 mg (12/29/2018), 0.25 mg (01/12/2019)  pegfilgrastim-jmdb (FULPHILA) injection 6 mg, 6 mg, Subcutaneous,  Once, 4 of 4 cycles. Administration: 6 mg (12/03/2018), 6 mg (12/17/2018), 6 mg (12/31/2018), 6 mg (01/14/2019)  CARBOplatin (PARAPLATIN) 510 mg in sodium chloride 0.9 % 250 mL chemo infusion, 510 mg (106.9 % of original dose 478.8 mg), Intravenous,  Once, 1 of 4 cycles. Dose modification: (original dose 478.8 mg, Cycle 5). Administration: 510 mg (01/25/2019)  cyclophosphamide (CYTOXAN) 1,140 mg in sodium chloride 0.9 % 250 mL chemo infusion, 600 mg/m2 = 1,140 mg, Intravenous,  Once, 4 of 4 cycles. Dose modification: 500 mg/m2 (original dose 600 mg/m2, Cycle 2, Reason: Dose not tolerated), 400 mg/m2 (original dose 600 mg/m2, Cycle 4, Reason: Dose not tolerated). Administration: 1,140 mg (12/01/2018), 960 mg (12/15/2018), 960 mg (12/29/2018), 760 mg (01/12/2019)  PACLitaxel (TAXOL) 150 mg in sodium chloride 0.9 % 250 mL chemo infusion (</= '80mg'$ /m2), 80 mg/m2 = 150 mg, Intravenous,  Once, 1 of 4 cycles Administration: 150 mg (01/25/2019)  fosaprepitant (EMEND) 150 mg   dexamethasone (DECADRON) 12 mg in sodium chloride 0.9 % 145 mL IVPB, , Intravenous,  Once, 5 of 8 cycles Administration:  (12/01/2018),  (01/25/2019),  (12/15/2018),  (12/29/2018),  (01/12/2019)   03/29/2019 Surgery   Left lumpectomy [name]) [phone]): scattered microscopic foci of high grade DCIS with no invasive carcinoma, clear margins, 2 left axillary lymph nodes negative.   03/29/2019 Cancer Staging   Staging form: Breast, AJCC 8th Edition - Pathologic stage from 03/29/2019: No Stage Recommended (ypTis (DCIS), pN0, cM0)  05/03/2019 - 05/31/2019 Radiation Therapy   The patient initially received a dose of 40.05 Gy in 15 fractions to the breast using whole-breast tangent fields. This was delivered using a 3-D  conformal technique. The pt received a boost delivering an additional 10 Gy in 5 fractions using a electron boost with 33mV electrons. The total dose was 50.05 Gy.      CHIEF COMPLIANT:   INTERVAL HISTORY: JSaahithi Kovacevicis a   ALLERGIES:  is allergic to carboplatin.  MEDICATIONS:  Current Outpatient Medications  Medication Sig Dispense Refill   atorvastatin (LIPITOR) 40 MG tablet Take 1 tablet (40 mg total) by mouth daily. 90 tablet 3   Cholecalciferol (VITAMIN D) 50 MCG (2000 UT) CAPS Take 2,000 Units by mouth daily.     donepezil (ARICEPT) 5 MG tablet Take 1 tablet (5 mg total) by mouth at bedtime. 90 tablet 3   escitalopram (LEXAPRO) 10 MG tablet Take 10 mg by mouth daily.     levothyroxine (SYNTHROID) 75 MCG tablet Take 75 mcg by mouth daily before breakfast.     telmisartan (MICARDIS) 40 MG tablet Take 40 mg by mouth daily.     No current facility-administered medications for this visit.    PHYSICAL EXAMINATION: ECOG PERFORMANCE STATUS: {CHL ONC ECOG PS:[id number]}  There were no vitals filed for this visit. There were no vitals filed for this visit.  BREAST:*** No palpable masses or nodules in either right or left breasts. No palpable axillary supraclavicular or infraclavicular adenopathy no breast tenderness or nipple discharge. (exam performed in the presence of a chaperone)  LABORATORY DATA:  I have reviewed the data as listed    Latest Ref Rng & Units 07/06/2022    1:25 AM 07/03/2020   12:41 PM 03/14/2019    9:51 AM  CMP  Glucose 70 - 99 mg/dL 119   85   BUN 8 - 23 mg/dL 28   27   Creatinine 0.44 - 1.00 mg/dL 1.35  1.20  1.19   Sodium 135 - 145 mmol/L 136   142   Potassium 3.5 - 5.1 mmol/L 4.0   4.4   Chloride 98 - 111 mmol/L 103   105   CO2 22 - 32 mmol/L 26   24   Calcium 8.9 - 10.3 mg/dL 8.1   9.2   Total Protein 6.5 - 8.1 g/dL 6.2   7.0   Total Bilirubin 0.3 - 1.2 mg/dL 0.6   0.5   Alkaline Phos 38 - 126 U/L 57   57   AST 15 - 41 U/L 20   16    ALT 0 - 44 U/L 22   14     Lab Results  Component Value Date   WBC 7.2 07/06/2022   HGB 11.3 (L) 07/06/2022   HCT 33.7 (L) 07/06/2022   MCV 104.3 (H) 07/06/2022   PLT 177 07/06/2022   NEUTROABS 4.9 07/06/2022    ASSESSMENT & PLAN:  No problem-specific Assessment & Plan notes found for this encounter.    No orders of the defined types were placed in this encounter.  The patient has a good understanding of the overall plan. she agrees with it. she will call with any problems that may develop before the next visit here. Total time spent: 30 mins including face to face time and time spent for planning, charting and co-ordination of care   DSuzzette Righter CMarrowbone03/08/24    I DGardiner Coinsam acting as a sEducation administratorfor Dr.Vinay  Gudena  ***

## 2022-07-28 ENCOUNTER — Inpatient Hospital Stay: Payer: Medicare Other | Attending: Hematology and Oncology | Admitting: Hematology and Oncology

## 2022-07-28 VITALS — BP 136/84 | HR 78 | Temp 97.8°F | Resp 18 | Ht 64.5 in | Wt 154.8 lb

## 2022-07-28 DIAGNOSIS — C50412 Malignant neoplasm of upper-outer quadrant of left female breast: Secondary | ICD-10-CM | POA: Diagnosis not present

## 2022-07-28 DIAGNOSIS — Z923 Personal history of irradiation: Secondary | ICD-10-CM | POA: Diagnosis not present

## 2022-07-28 DIAGNOSIS — Z79899 Other long term (current) drug therapy: Secondary | ICD-10-CM | POA: Diagnosis not present

## 2022-07-28 DIAGNOSIS — Z853 Personal history of malignant neoplasm of breast: Secondary | ICD-10-CM | POA: Insufficient documentation

## 2022-07-28 DIAGNOSIS — Z171 Estrogen receptor negative status [ER-]: Secondary | ICD-10-CM | POA: Insufficient documentation

## 2022-07-28 DIAGNOSIS — R413 Other amnesia: Secondary | ICD-10-CM | POA: Diagnosis not present

## 2022-07-28 DIAGNOSIS — Z9221 Personal history of antineoplastic chemotherapy: Secondary | ICD-10-CM | POA: Diagnosis not present

## 2022-07-28 DIAGNOSIS — F419 Anxiety disorder, unspecified: Secondary | ICD-10-CM | POA: Diagnosis not present

## 2022-07-28 MED ORDER — DONEPEZIL HCL 10 MG PO TABS: 10.0000 mg | ORAL_TABLET | Freq: Every day | ORAL | 3 refills | Status: DC

## 2022-07-28 NOTE — Assessment & Plan Note (Signed)
/  22/2020: Routine screening mammogram detected 2 masses in the left breast, measuring 1.6cm and 1.1cm, spanning a total of 3cm. Biopsy confirmed IDC with DCIS, HER-2 negative by FISH (2+ by IHC), ER/PR negative, Ki67 80%.  Staging: T2N0 stage IIb clinical stage Breast MRI: 11/18/2018: 2.9 cm bilobed mass UOQ left breast   Treatment plan 1.  Neoadjuvant chemotherapy with dose dense Adriamycin and Cytoxan x4 followed by Taxol and carboplatin completed 01/25/2019 2. 05/28/18: Left lumpectomy Carol Byrd): scattered microscopic foci of high grade DCIS with no invasive carcinoma, clear margins, 2 left axillary lymph nodes negative. 2.  Followed by adjuvant radiation 05/03/2019-05/24/2019 -------------------------------------------------------------------------------------------------- Treatment plan: Surveillance 1. Mammogram and Ultrasound: 11/26/2021: Benign, breast density category C 2. Breast Exam: 07/28/2022: Benign  They have Havanese dogs and enjoy spending time with them.   Memory issues: Due to chemotherapy: Currently on Aricept. Return to clinic in 1 year for follow-up

## 2022-08-14 ENCOUNTER — Encounter: Payer: Self-pay | Admitting: Diagnostic Neuroimaging

## 2022-08-14 ENCOUNTER — Ambulatory Visit: Payer: Medicare Other | Admitting: Diagnostic Neuroimaging

## 2022-08-14 VITALS — BP 112/78 | HR 92 | Ht 64.0 in | Wt 153.0 lb

## 2022-08-14 DIAGNOSIS — R413 Other amnesia: Secondary | ICD-10-CM

## 2022-08-14 NOTE — Progress Notes (Signed)
[facility]  PATIENT: Carol Byrd DOB: 06-30-1945  REFERRING CLINICIAN: [name], MD HISTORY FROM: patient and husband REASON FOR VISIT: new consult   HISTORICAL  CHIEF COMPLAINT:  Chief Complaint  Patient presents with   Follow-up    Patient in room #6 with her husband. Patient states she here today to f/u with her short term memory.    HISTORY OF PRESENT ILLNESS:   UPDATE (08/14/22, VRP): Since last visit, doing about the same. Mild short term memory loss. Still with anxiety and insomnia issues. Sleeping ~5 hours per night. Now on donepezil 10mg  at bedtime and memantine 10mg  twice a day.    PRIOR HPI (05/29/21, VRP): 77 year old female here for evaluation of memory loss.  Symptoms started on October 2020.  Around the time she was diagnosed with breast cancer and underwent chemotherapy.  She had some side effects with her second round.  Since that time symptoms have continued.  She feels some problems with attention, focus, short-term memory.  She is also been more socially isolated during the [place] pandemic.  She does extensive word puzzles, sudoku and other games, and does well on these.  Having some issues with anxiety and insomnia. Has had mild issues like this in the past, but more lately.      REVIEW OF SYSTEMS: Full 14 system review of systems performed and negative with exception of: as per HPI.  ALLERGIES: Allergies  Allergen Reactions   Carboplatin     Other reaction(s): Other (See Comments) Caused low blood counts requiring blood transfusion     HOME MEDICATIONS: Outpatient Medications Prior to Visit  Medication Sig Dispense Refill   atorvastatin (LIPITOR) 40 MG tablet Take 1 tablet (40 mg total) by mouth daily. 90 tablet 3   Cholecalciferol (VITAMIN D) 50 MCG (2000 UT) CAPS Take 2,000 Units by mouth daily.     donepezil (ARICEPT) 10 MG tablet Take 1 tablet (10 mg total) by mouth at bedtime. 90 tablet 3   escitalopram  (LEXAPRO) 10 MG tablet Take 10 mg by mouth daily.     ezetimibe (ZETIA) 10 MG tablet Take 10 mg by mouth daily.     levothyroxine (SYNTHROID) 75 MCG tablet Take 75 mcg by mouth daily before breakfast.     memantine (NAMENDA) 10 MG tablet Take 10 mg by mouth 2 (two) times daily.     telmisartan (MICARDIS) 40 MG tablet Take 40 mg by mouth daily.     No facility-administered medications prior to visit.    PAST MEDICAL HISTORY: Past Medical History:  Diagnosis Date   Abnormal CT scan, stomach 07/26/2020   Achilles tendonitis 10/04/2013   Alopecia    Breast cancer ([place])    Left   Cervical disc disease    Essential hypertension    Family history of breast cancer    Family history of lung cancer    Family history of melanoma    Family history of multiple myeloma    Family history of stomach cancer    GERD (gastroesophageal reflux disease)    Hypothyroidism    Impaired fasting glucose    Metatarsal fracture 03/13/2011   Mixed hyperlipidemia    Personal history of chemotherapy    Personal history of radiation therapy    Port-A-Cath in place 12/01/2018   Vitamin D deficiency     PAST SURGICAL HISTORY: Past Surgical History:  Procedure Laterality Date   BACK SURGERY  1985   bulging lumbar disk   BIOPSY  04/14/2018  Procedure: BIOPSY;  Surgeon: Rogene Houston, MD;  [facility];  Service: Endoscopy;;  esophagus   BREAST LUMPECTOMY Left 03/2019   BREAST LUMPECTOMY WITH RADIOACTIVE SEED AND SENTINEL LYMPH NODE BIOPSY Left 03/29/2019   Procedure: LEFT BREAST LUMPECTOMY WITH RADIOACTIVE SEED AND LEFT AXILLARY SENTINEL LYMPH NODE BIOPSY BLUE DYE INJECTION;  Surgeon: Rolm Bookbinder, MD;  Location: [place];  Service: General;  Laterality: Left;   cataracts     COLONOSCOPY N/A 02/14/2015   Procedure: COLONOSCOPY;  Surgeon: Rogene Houston, MD;  [facility];  Service: Endoscopy;  Laterality: N/A;  1200   ESOPHAGEAL DILATION N/A 04/14/2018    Procedure: ESOPHAGEAL DILATION;  Surgeon: Rogene Houston, MD;  [facility];  Service: Endoscopy;  Laterality: N/A;   ESOPHAGOGASTRODUODENOSCOPY N/A 04/14/2018   Procedure: ESOPHAGOGASTRODUODENOSCOPY (EGD);  Surgeon: Rogene Houston, MD;  [facility];  Service: Endoscopy;  Laterality: N/A;  3:10   ESOPHAGOGASTRODUODENOSCOPY (EGD) WITH PROPOFOL N/A 08/07/2020   Procedure: ESOPHAGOGASTRODUODENOSCOPY (EGD) WITH PROPOFOL;  Surgeon: Harvel Quale, MD;  [facility];  Service: Gastroenterology;  Laterality: N/A;  AM   PORT-A-CATH REMOVAL Right 03/29/2019   Procedure: REMOVAL PORT-A-CATH;  Surgeon: Rolm Bookbinder, MD;  Location: [place];  Service: General;  Laterality: Right;   PORTACATH PLACEMENT Right 11/30/2018   Procedure: INSERTION PORT-A-CATH WITH ULTRASOUND;  Surgeon: Rolm Bookbinder, MD;  Location: [place];  Service: General;  Laterality: Right;   RETINAL TEAR REPAIR CRYOTHERAPY     THYROIDECTOMY      FAMILY HISTORY: Family History  Problem Relation Age of Onset   Depression Mother    Hypertension Mother    Heart attack Father    Heart disease Other    Multiple myeloma Cousin 50       maternal first cousin   Breast cancer Sister 39       second breast cancer diagnosed at 27   Breast cancer Maternal Aunt 80   Cancer Maternal Uncle        unknown   Stomach cancer Maternal Grandfather 11   Lung cancer Maternal Uncle 72   Lung cancer Maternal Uncle 65   Lung cancer Cousin        maternal first cousins   Cancer Cousin        unknown, paternal first cousins   Colon cancer Neg Hx     SOCIAL HISTORY: Social History   Socioeconomic History   Marital status: Married    Spouse name: Cecilie Lowers   Number of children: Not on file   Years of education: 15   Highest education level: Associate degree: occupational, Hotel manager, or vocational program  Occupational History   Occupation: retired  Tobacco Use   Smoking  status: Former    Types: Cigarettes    Quit date: 04/14/1994    Years since quitting: 28.3   Smokeless tobacco: Never  Vaping Use   Vaping Use: Never used  Substance and Sexual Activity   Alcohol use: Yes    Alcohol/week: 2.0 standard drinks of alcohol    Types: 2 Glasses of wine per week    Comment: wine 2 a night   Drug use: No   Sexual activity: Not on file  Other Topics Concern   Not on file  Social History Narrative   05/29/21 lives with husband   Social Determinants of Health   Financial Resource Strain: Not on file  Food Insecurity: Not on file  Transportation  Needs: Not on file  Physical Activity: Not on file  Stress: Not on file  Social Connections: Not on file  Intimate Partner Violence: Not on file     PHYSICAL EXAM  GENERAL EXAM/CONSTITUTIONAL: Vitals:  Vitals:   08/14/22 1343  BP: 112/78  Pulse: 92  Weight: 153 lb (69.4 kg)  Height: 5\' 4"  (1.626 m)   Body mass index is 26.26 kg/m. Wt Readings from Last 3 Encounters:  08/14/22 153 lb (69.4 kg)  07/28/22 154 lb 12.8 oz (70.2 kg)  07/11/22 154 lb 9.6 oz (70.1 kg)   Patient is in no distress; well developed, nourished and groomed; neck is supple  CARDIOVASCULAR: Examination of carotid arteries is normal; no carotid bruits Regular rate and rhythm, no murmurs Examination of peripheral vascular system by observation and palpation is normal  EYES: Ophthalmoscopic exam of optic discs and posterior segments is normal; no papilledema or hemorrhages No results found.  MUSCULOSKELETAL: Gait, strength, tone, movements noted in Neurologic exam below  NEUROLOGIC: MENTAL STATUS:     08/14/2022    1:45 PM 05/29/2021    1:17 PM  MMSE - Mini Mental State Exam  Orientation to time 3 5  Orientation to Place 5 5  Registration 3 3  Attention/ Calculation 5 5  Recall 3 3  Language- name 2 objects 2 2  Language- repeat 1 1  Language- follow 3 step command 2 3  Language- read & follow direction 1 1  Write  a sentence 1 1  Copy design 1 1  Total score 27 30   awake, alert, oriented to person, place and time recent and remote memory intact normal attention and concentration language fluent, comprehension intact, naming intact fund of knowledge appropriate  CRANIAL NERVE:  2nd - no papilledema on fundoscopic exam 2nd, 3rd, 4th, 6th - LEFT PUPIL POST SURGICAL, NO REACTION 5MM; RIGHT PUPIL 3MM REACTIVE; visual fields full to confrontation, extraocular muscles intact, no nystagmus 5th - facial sensation symmetric 7th - facial strength symmetric 8th - hearing intact 9th - palate elevates symmetrically, uvula midline 11th - shoulder shrug symmetric 12th - tongue protrusion midline  MOTOR:  normal bulk and tone, full strength in the BUE, BLE  SENSORY:  normal and symmetric to light touch, temperature, vibration  COORDINATION:  finger-nose-finger, fine finger movements normal  REFLEXES:  deep tendon reflexes present and symmetric  GAIT/STATION:  narrow based gait     DIAGNOSTIC DATA (LABS, IMAGING, TESTING) - I reviewed patient records, labs, notes, testing and imaging myself where available.  Lab Results  Component Value Date   WBC 7.2 07/06/2022   HGB 11.3 (L) 07/06/2022   HCT 33.7 (L) 07/06/2022   MCV 104.3 (H) 07/06/2022   PLT 177 07/06/2022      Component Value Date/Time   NA 136 07/06/2022 0125   K 4.0 07/06/2022 0125   CL 103 07/06/2022 0125   CO2 26 07/06/2022 0125   GLUCOSE 119 (H) 07/06/2022 0125   BUN 28 (H) 07/06/2022 0125   CREATININE 1.35 (H) 07/06/2022 0125   CREATININE 1.19 (H) 03/14/2019 0951   CALCIUM 8.1 (L) 07/06/2022 0125   PROT 6.2 (L) 07/06/2022 0125   ALBUMIN 3.4 (L) 07/06/2022 0125   AST 20 07/06/2022 0125   AST 16 03/14/2019 0951   ALT 22 07/06/2022 0125   ALT 14 03/14/2019 0951   ALKPHOS 57 07/06/2022 0125   BILITOT 0.6 07/06/2022 0125   BILITOT 0.5 03/14/2019 0951   GFRNONAA 40 (L) 07/06/2022 0125  GFRNONAA 45 (L) 03/14/2019 0951    GFRAA 52 (L) 03/14/2019 0951   No results found for: "CHOL", "HDL", "LDLCALC", "LDLDIRECT", "TRIG", "CHOLHDL" No results found for: "HGBA1C" No results found for: "VITAMINB12" No results found for: "TSH"  B12 479  TSH 1.69  09/20/20 MRI brain - No metastatic disease or acute intracranial abnormality. Essentially normal for age MRI appearance of the brain.    ASSESSMENT AND PLAN  77 y.o. year old female here with:   Dx:  1. Memory loss      PLAN:  MILD MEMORY LOSS (since Oct 2020; some mild ADHD and anxiety / insomnia for many years; no signs of dementia at this time; MMSE 30/30 --> 27/30) - mild memory issues; stable since 2020; no changes in ADLs - currently on donepezil / memantine (discussed how these meds work and the benefit; I would reocmmend to stop these, since not showing any benefit, and also patient would like to simplify regimen) - if significant progression, then could consider amyloid PET, ATN blood panel, neuropsychology testing - safety / supervision issues reviewed - daily physical activity / exercise (at least 15-30 minutes) - eat more plants / vegetables - increase social activities, brain stimulation, games, puzzles, hobbies, crafts, arts, music - aim for at least 7-8 hours sleep per night (or more) - avoid smoking and alcohol - caution with medications, finances, driving  ANXIETY / INSOMNIA - continue lexapro and trazodone per PCP - then consider referral to psychiatry / psychology for better treatment options - also encouraged brain healthy activities, particularly physical exercise  Return in about 3 months (around 11/14/2022) for MyChart visit (15 min).    Penni Bombard, MD [id number], 0000000 PM Certified in Neurology, Neurophysiology and Neuroimaging  [facility] [address] [place], [place] [address] [phone]

## 2022-08-14 NOTE — Patient Instructions (Addendum)
   MILD MEMORY LOSS (since Oct 2020; possible mild ADHD and anxiety for many years; no sign of dementia at this time; MMSE 30/30 --> 27/30) - mild memory issues; stable since 2020; no changes in ADLs - currently on donepezil / memantine (discussed how these meds work and the benefit; I would recommend to stop these, since not showing any benefit, and also patient would like to simplify regimen) - if significant progression, then could consider amyloid PET, ATN blood panel, neuropsychology tetsing - safety / supervision issues reviewed - daily physical activity / exercise (at least 15-30 minutes) - eat more plants / vegetables - increase social activities, brain stimulation, games, puzzles, hobbies, crafts, arts, music - aim for at least 7-8 hours sleep per night (or more) - avoid smoking and alcohol - caution with medications, finances, driving   ANXIETY / INSOMNIA - continue lexapro and trazodone per PCP - consider referral to psychiatry / psychology for better treatment

## 2022-08-17 DIAGNOSIS — I10 Essential (primary) hypertension: Secondary | ICD-10-CM | POA: Diagnosis not present

## 2022-08-17 DIAGNOSIS — E785 Hyperlipidemia, unspecified: Secondary | ICD-10-CM | POA: Diagnosis not present

## 2022-10-20 DIAGNOSIS — N1831 Chronic kidney disease, stage 3a: Secondary | ICD-10-CM | POA: Diagnosis not present

## 2022-10-20 DIAGNOSIS — R7301 Impaired fasting glucose: Secondary | ICD-10-CM | POA: Diagnosis not present

## 2022-10-20 DIAGNOSIS — E785 Hyperlipidemia, unspecified: Secondary | ICD-10-CM | POA: Diagnosis not present

## 2022-10-20 DIAGNOSIS — I10 Essential (primary) hypertension: Secondary | ICD-10-CM | POA: Diagnosis not present

## 2022-10-20 DIAGNOSIS — C50919 Malignant neoplasm of unspecified site of unspecified female breast: Secondary | ICD-10-CM | POA: Diagnosis not present

## 2022-10-21 DIAGNOSIS — Z961 Presence of intraocular lens: Secondary | ICD-10-CM | POA: Diagnosis not present

## 2022-10-21 DIAGNOSIS — H35372 Puckering of macula, left eye: Secondary | ICD-10-CM | POA: Diagnosis not present

## 2022-10-23 DIAGNOSIS — N183 Chronic kidney disease, stage 3 unspecified: Secondary | ICD-10-CM | POA: Diagnosis not present

## 2022-10-23 DIAGNOSIS — I10 Essential (primary) hypertension: Secondary | ICD-10-CM | POA: Diagnosis not present

## 2022-10-23 DIAGNOSIS — R413 Other amnesia: Secondary | ICD-10-CM | POA: Diagnosis not present

## 2022-10-24 ENCOUNTER — Other Ambulatory Visit: Payer: Self-pay | Admitting: Internal Medicine

## 2022-10-24 DIAGNOSIS — Z0000 Encounter for general adult medical examination without abnormal findings: Secondary | ICD-10-CM

## 2022-11-17 ENCOUNTER — Telehealth: Payer: Medicare Other | Admitting: Diagnostic Neuroimaging

## 2022-11-24 DIAGNOSIS — S2096XA Insect bite (nonvenomous) of unspecified parts of thorax, initial encounter: Secondary | ICD-10-CM | POA: Diagnosis not present

## 2022-11-27 ENCOUNTER — Ambulatory Visit
Admission: RE | Admit: 2022-11-27 | Discharge: 2022-11-27 | Disposition: A | Discharge status: Home / Self Care | Payer: Medicare Other | Source: Ambulatory Visit | Attending: Internal Medicine | Admitting: Internal Medicine

## 2022-11-27 DIAGNOSIS — Z1231 Encounter for screening mammogram for malignant neoplasm of breast: Secondary | ICD-10-CM | POA: Diagnosis not present

## 2022-11-27 DIAGNOSIS — Z0000 Encounter for general adult medical examination without abnormal findings: Secondary | ICD-10-CM

## 2022-12-09 DIAGNOSIS — L720 Epidermal cyst: Secondary | ICD-10-CM | POA: Diagnosis not present

## 2022-12-09 DIAGNOSIS — L821 Other seborrheic keratosis: Secondary | ICD-10-CM | POA: Diagnosis not present

## 2022-12-09 DIAGNOSIS — L658 Other specified nonscarring hair loss: Secondary | ICD-10-CM | POA: Diagnosis not present

## 2022-12-09 DIAGNOSIS — Z808 Family history of malignant neoplasm of other organs or systems: Secondary | ICD-10-CM | POA: Diagnosis not present

## 2022-12-09 DIAGNOSIS — D1801 Hemangioma of skin and subcutaneous tissue: Secondary | ICD-10-CM | POA: Diagnosis not present

## 2023-02-06 DIAGNOSIS — G454 Transient global amnesia: Secondary | ICD-10-CM | POA: Diagnosis not present

## 2023-02-06 DIAGNOSIS — Z23 Encounter for immunization: Secondary | ICD-10-CM | POA: Diagnosis not present

## 2023-02-06 DIAGNOSIS — I10 Essential (primary) hypertension: Secondary | ICD-10-CM | POA: Diagnosis not present

## 2023-02-23 ENCOUNTER — Telehealth: Payer: Medicare Other | Admitting: Diagnostic Neuroimaging

## 2023-02-23 ENCOUNTER — Encounter: Payer: Self-pay | Admitting: Diagnostic Neuroimaging

## 2023-02-23 DIAGNOSIS — F419 Anxiety disorder, unspecified: Secondary | ICD-10-CM | POA: Diagnosis not present

## 2023-02-23 DIAGNOSIS — G4700 Insomnia, unspecified: Secondary | ICD-10-CM

## 2023-02-23 DIAGNOSIS — R413 Other amnesia: Secondary | ICD-10-CM

## 2023-02-23 NOTE — Progress Notes (Signed)
[facility]  PATIENT: Carol Byrd DOB: 03/15/1946  REFERRING CLINICIAN: Carylon Perches, MD HISTORY FROM: patient  REASON FOR VISIT: follow up   HISTORICAL  CHIEF COMPLAINT:  Chief Complaint  Patient presents with   Memory Loss         HISTORY OF PRESENT ILLNESS:   UPDATE (02/23/23, VRP): Since last visit, doing well. Symptoms are stable. Severity is mild. No alleviating or aggravating factors. Tolerating meds.    UPDATE (08/14/22, VRP): Since last visit, doing about the same. Mild short term memory loss. Still with anxiety and insomnia issues. Sleeping ~5 hours per night. Now on donepezil 10mg  at bedtime and memantine 10mg  twice a day.    PRIOR HPI (05/29/21, VRP): 77 year old female here for evaluation of memory loss.  Symptoms started on October 2020.  Around the time she was diagnosed with breast cancer and underwent chemotherapy.  She had some side effects with her second round.  Since that time symptoms have continued.  She feels some problems with attention, focus, short-term memory.  She is also been more socially isolated during the COVID pandemic.  She does extensive word puzzles, sudoku and other games, and does well on these.  Having some issues with anxiety and insomnia. Has had mild issues like this in the past, but more lately.      REVIEW OF SYSTEMS: Full 14 system review of systems performed and negative with exception of: as per HPI.  ALLERGIES: Allergies  Allergen Reactions   Carboplatin     Other reaction(s): Other (See Comments) Caused low blood counts requiring blood transfusion     HOME MEDICATIONS: Outpatient Medications Prior to Visit  Medication Sig Dispense Refill   atorvastatin (LIPITOR) 40 MG tablet Take 1 tablet (40 mg total) by mouth daily. 90 tablet 3   Cholecalciferol (VITAMIN D) 50 MCG (2000 UT) CAPS Take 2,000 Units by mouth daily.     donepezil (ARICEPT) 10 MG tablet Take 1 tablet (10 mg total) by mouth at  bedtime. 90 tablet 3   escitalopram (LEXAPRO) 10 MG tablet Take 10 mg by mouth daily.     ezetimibe (ZETIA) 10 MG tablet Take 10 mg by mouth daily.     levothyroxine (SYNTHROID) 75 MCG tablet Take 75 mcg by mouth daily before breakfast.     memantine (NAMENDA) 10 MG tablet Take 10 mg by mouth 2 (two) times daily.     telmisartan (MICARDIS) 40 MG tablet Take 40 mg by mouth daily.     No facility-administered medications prior to visit.    PAST MEDICAL HISTORY: Past Medical History:  Diagnosis Date   Abnormal CT scan, stomach 07/26/2020   Achilles tendonitis 10/04/2013   Alopecia    Breast cancer (HCC)    Left   Cervical disc disease    Essential hypertension    Family history of breast cancer    Family history of lung cancer    Family history of melanoma    Family history of multiple myeloma    Family history of stomach cancer    GERD (gastroesophageal reflux disease)    Hypothyroidism    Impaired fasting glucose    Metatarsal fracture 03/13/2011   Mixed hyperlipidemia    Personal history of chemotherapy    Personal history of radiation therapy    Port-A-Cath in place 12/01/2018   Vitamin D deficiency     PAST SURGICAL HISTORY: Past Surgical History:  Procedure Laterality Date   BACK SURGERY  1985   bulging  lumbar disk   BIOPSY  04/14/2018   Procedure: BIOPSY;  Surgeon: Malissa Hippo, MD;  [facility]: [facility];  Service: Endoscopy;;  esophagus   BREAST LUMPECTOMY Left 03/2019   BREAST LUMPECTOMY WITH RADIOACTIVE SEED AND SENTINEL LYMPH NODE BIOPSY Left 03/29/2019   Procedure: LEFT BREAST LUMPECTOMY WITH RADIOACTIVE SEED AND LEFT AXILLARY SENTINEL LYMPH NODE BIOPSY BLUE DYE INJECTION;  Surgeon: Emelia Loron, MD;  Location: [facility];  Service: General;  Laterality: Left;   cataracts     COLONOSCOPY N/A 02/14/2015   Procedure: COLONOSCOPY;  Surgeon: Malissa Hippo, MD;  Location: [facility];  Service: Endoscopy;  Laterality: N/A;  1200    ESOPHAGEAL DILATION N/A 04/14/2018   Procedure: ESOPHAGEAL DILATION;  Surgeon: Malissa Hippo, MD;  Location: [facility];  Service: Endoscopy;  Laterality: N/A;   ESOPHAGOGASTRODUODENOSCOPY N/A 04/14/2018   Procedure: ESOPHAGOGASTRODUODENOSCOPY (EGD);  Surgeon: Malissa Hippo, MD;  [facility]: [facility];  Service: Endoscopy;  Laterality: N/A;  3:10   ESOPHAGOGASTRODUODENOSCOPY (EGD) WITH PROPOFOL N/A 08/07/2020   Procedure: ESOPHAGOGASTRODUODENOSCOPY (EGD) WITH PROPOFOL;  Surgeon: Dolores Frame, MD;  Location: [facility];  Service: Gastroenterology;  Laterality: N/A;  AM   PORT-A-CATH REMOVAL Right 03/29/2019   Procedure: REMOVAL PORT-A-CATH;  Surgeon: Emelia Loron, MD;  Location: [facility];  Service: General;  Laterality: Right;   PORTACATH PLACEMENT Right 11/30/2018   Procedure: INSERTION PORT-A-CATH WITH ULTRASOUND;  Surgeon: Emelia Loron, MD;  Location: [facility];  Service: General;  Laterality: Right;   RETINAL TEAR REPAIR CRYOTHERAPY     THYROIDECTOMY      FAMILY HISTORY: Family History  Problem Relation Age of Onset   Depression Mother    Hypertension Mother    Heart attack Father    Heart disease Other    Multiple myeloma Cousin 23       maternal first cousin   Breast cancer Sister 31       second breast cancer diagnosed at 37   Breast cancer Maternal Aunt 64   Cancer Maternal Uncle        unknown   Stomach cancer Maternal Grandfather 34   Lung cancer Maternal Uncle 75   Lung cancer Maternal Uncle 42   Lung cancer Cousin        maternal first cousins   Cancer Cousin        unknown, paternal first cousins   Colon cancer Neg Hx     SOCIAL HISTORY: Social History   Socioeconomic History   Marital status: Married    Spouse name: Tasia Catchings   Number of children: Not on file   Years of education: 15   Highest education level: Associate degree: occupational, Scientist, product/process development, or vocational program  Occupational History   Occupation:  retired  Tobacco Use   Smoking status: Former    Current packs/day: 0.00    Types: Cigarettes    Quit date: 04/14/1994    Years since quitting: 28.8   Smokeless tobacco: Never  Vaping Use   Vaping status: Never Used  Substance and Sexual Activity   Alcohol use: Yes    Alcohol/week: 2.0 standard drinks of alcohol    Types: 2 Glasses of wine per week    Comment: wine 2 a night   Drug use: No   Sexual activity: Not on file  Other Topics Concern   Not on file  Social History Narrative   05/29/21 lives with husband   Social Determinants of Health  Financial Resource Strain: Not on file  Food Insecurity: Not on file  Transportation Needs: Not on file  Physical Activity: Not on file  Stress: Not on file  Social Connections: Not on file  Intimate Partner Violence: Not on file     PHYSICAL EXAM  NEUROLOGIC: MENTAL STATUS:     08/14/2022    1:45 PM 05/29/2021    1:17 PM  MMSE - Mini Mental State Exam  Orientation to time 3 5  Orientation to Place 5 5  Registration 3 3  Attention/ Calculation 5 5  Recall 3 3  Language- name 2 objects 2 2  Language- repeat 1 1  Language- follow 3 step command 2 3  Language- read & follow direction 1 1  Write a sentence 1 1  Copy design 1 1  Total score 27 30      DIAGNOSTIC DATA (LABS, IMAGING, TESTING) - I reviewed patient records, labs, notes, testing and imaging myself where available.  Lab Results  Component Value Date   WBC 7.2 07/06/2022   HGB 11.3 (L) 07/06/2022   HCT 33.7 (L) 07/06/2022   MCV 104.3 (H) 07/06/2022   PLT 177 07/06/2022      Component Value Date/Time   NA 136 07/06/2022 0125   K 4.0 07/06/2022 0125   CL 103 07/06/2022 0125   CO2 26 07/06/2022 0125   GLUCOSE 119 (H) 07/06/2022 0125   BUN 28 (H) 07/06/2022 0125   CREATININE 1.35 (H) 07/06/2022 0125   CREATININE 1.19 (H) 03/14/2019 0951   CALCIUM 8.1 (L) 07/06/2022 0125   PROT 6.2 (L) 07/06/2022 0125   ALBUMIN 3.4 (L) 07/06/2022 0125   AST 20  07/06/2022 0125   AST 16 03/14/2019 0951   ALT 22 07/06/2022 0125   ALT 14 03/14/2019 0951   ALKPHOS 57 07/06/2022 0125   BILITOT 0.6 07/06/2022 0125   BILITOT 0.5 03/14/2019 0951   GFRNONAA 40 (L) 07/06/2022 0125   GFRNONAA 45 (L) 03/14/2019 0951   GFRAA 52 (L) 03/14/2019 0951   No results found for: "CHOL", "HDL", "LDLCALC", "LDLDIRECT", "TRIG", "CHOLHDL" No results found for: "HGBA1C" No results found for: "VITAMINB12" No results found for: "TSH"  B12 479  TSH 1.69  09/20/20 MRI brain - No metastatic disease or acute intracranial abnormality. Essentially normal for age MRI appearance of the brain.    ASSESSMENT AND PLAN  77 y.o. year old female here with:   Dx:  1. Memory loss      PLAN:  MILD MEMORY LOSS (since Oct 2020; some mild ADHD and anxiety / insomnia for many years; no signs of dementia at this time; MMSE 30/30 --> 27/30) - mild memory issues; stable since 2020; no changes in ADLs - if significant progression, then could consider amyloid PET, ATN blood panel, neuropsychology testing - safety / supervision issues reviewed - daily physical activity / exercise (at least 15-30 minutes) - eat more plants / vegetables - increase social activities, brain stimulation, games, puzzles, hobbies, crafts, arts, music - aim for at least 7-8 hours sleep per night (or more) - avoid smoking and alcohol - caution with medications, finances, driving  ANXIETY / INSOMNIA - continue lexapro per PCP - then consider referral to psychiatry / psychology for better treatment options - also encouraged brain healthy activities, particularly physical exercise  Return for return to PCP, pending if symptoms worsen or fail to improve.  Virtual Visit via Video Note  I connected with Carol Byrd on 02/23/23 at  2:30  PM EDT by a video enabled telemedicine application and verified that I am speaking with the correct person using two identifiers.   I discussed the  limitations of evaluation and management by telemedicine and the availability of in person appointments. The patient expressed understanding and agreed to proceed.  Patient is at home and I am at the office.   I spent 15 minutes of face-to-face and non-face-to-face time with patient.  This included previsit chart review, lab review, study review, order entry, electronic health record documentation, patient education.     Suanne Marker, MD 02/23/2023, 2:29 PM Certified in Neurology, Neurophysiology and Neuroimaging  [facility] [address], [address] [place], [place] [address] [phone]

## 2023-04-09 NOTE — Telephone Encounter (Signed)
Telephone call  

## 2023-05-28 DIAGNOSIS — I10 Essential (primary) hypertension: Secondary | ICD-10-CM | POA: Diagnosis not present

## 2023-05-28 DIAGNOSIS — R413 Other amnesia: Secondary | ICD-10-CM | POA: Diagnosis not present

## 2023-06-23 DIAGNOSIS — I700 Atherosclerosis of aorta: Secondary | ICD-10-CM | POA: Diagnosis not present

## 2023-06-23 DIAGNOSIS — R413 Other amnesia: Secondary | ICD-10-CM | POA: Diagnosis not present

## 2023-06-23 DIAGNOSIS — E785 Hyperlipidemia, unspecified: Secondary | ICD-10-CM | POA: Diagnosis not present

## 2023-06-23 DIAGNOSIS — C50919 Malignant neoplasm of unspecified site of unspecified female breast: Secondary | ICD-10-CM | POA: Diagnosis not present

## 2023-06-23 DIAGNOSIS — N1831 Chronic kidney disease, stage 3a: Secondary | ICD-10-CM | POA: Diagnosis not present

## 2023-06-23 DIAGNOSIS — I10 Essential (primary) hypertension: Secondary | ICD-10-CM | POA: Diagnosis not present

## 2023-06-23 DIAGNOSIS — Z79899 Other long term (current) drug therapy: Secondary | ICD-10-CM | POA: Diagnosis not present

## 2023-06-30 DIAGNOSIS — I10 Essential (primary) hypertension: Secondary | ICD-10-CM | POA: Diagnosis not present

## 2023-06-30 DIAGNOSIS — I700 Atherosclerosis of aorta: Secondary | ICD-10-CM | POA: Diagnosis not present

## 2023-06-30 DIAGNOSIS — N183 Chronic kidney disease, stage 3 unspecified: Secondary | ICD-10-CM | POA: Diagnosis not present

## 2023-06-30 DIAGNOSIS — R413 Other amnesia: Secondary | ICD-10-CM | POA: Diagnosis not present

## 2023-06-30 DIAGNOSIS — E039 Hypothyroidism, unspecified: Secondary | ICD-10-CM | POA: Diagnosis not present

## 2023-06-30 DIAGNOSIS — Z853 Personal history of malignant neoplasm of breast: Secondary | ICD-10-CM | POA: Diagnosis not present

## 2023-06-30 DIAGNOSIS — Z0001 Encounter for general adult medical examination with abnormal findings: Secondary | ICD-10-CM | POA: Diagnosis not present

## 2023-06-30 DIAGNOSIS — E785 Hyperlipidemia, unspecified: Secondary | ICD-10-CM | POA: Diagnosis not present

## 2023-07-29 NOTE — Assessment & Plan Note (Signed)
 11/08/2018: Routine screening mammogram detected 2 masses in the left breast, measuring 1.6cm and 1.1cm, spanning a total of 3cm. Biopsy confirmed IDC with DCIS, HER-2 negative by FISH (2+ by IHC), ER/PR negative, Ki67 80%.  Staging: T2N0 stage IIb clinical stage Breast MRI: 11/18/2018: 2.9 cm bilobed mass UOQ left breast   Treatment plan 1.  Neoadjuvant chemotherapy with dose dense Adriamycin and Cytoxan x4 followed by Taxol and carboplatin completed 01/25/2019 2. 05/28/18: Left lumpectomy Dwain Sarna): scattered microscopic foci of high grade DCIS with no invasive carcinoma, clear margins, 2 left axillary lymph nodes negative. 2.  Followed by adjuvant radiation 05/03/2019-05/24/2019 -------------------------------------------------------------------------------------------------- Treatment plan: Surveillance 1. Mammogram and Ultrasound: 11/28/2022: Benign, breast density category C 2. Breast Exam: 07/30/2023: Benign  They have Havanese dogs and enjoy spending time with them.   Memory issues: Due to chemotherapy: Currently on Aricept.  Patient tells me that things are not improving therefore we will increase the dosage to 10 mg a day.  She is seeing neurology very soon to talk about both memory issues and anxiety.   Return to clinic in 1 year for follow-up

## 2023-07-30 ENCOUNTER — Inpatient Hospital Stay: Payer: Medicare Other | Attending: Hematology and Oncology | Admitting: Hematology and Oncology

## 2023-07-30 VITALS — BP 110/58 | HR 81 | Temp 98.3°F | Resp 17 | Ht 64.0 in | Wt 157.4 lb

## 2023-07-30 DIAGNOSIS — Z853 Personal history of malignant neoplasm of breast: Secondary | ICD-10-CM | POA: Insufficient documentation

## 2023-07-30 DIAGNOSIS — Z9221 Personal history of antineoplastic chemotherapy: Secondary | ICD-10-CM | POA: Diagnosis not present

## 2023-07-30 DIAGNOSIS — Z08 Encounter for follow-up examination after completed treatment for malignant neoplasm: Secondary | ICD-10-CM | POA: Insufficient documentation

## 2023-07-30 DIAGNOSIS — Z171 Estrogen receptor negative status [ER-]: Secondary | ICD-10-CM

## 2023-07-30 DIAGNOSIS — R413 Other amnesia: Secondary | ICD-10-CM | POA: Insufficient documentation

## 2023-07-30 DIAGNOSIS — C50412 Malignant neoplasm of upper-outer quadrant of left female breast: Secondary | ICD-10-CM

## 2023-07-30 NOTE — Progress Notes (Signed)
 Patient Care Team: Carylon Perches, MD as PCP - General (Internal Medicine) Jonelle Sidle, MD as PCP - Cardiology (Cardiology) Antony Blackbird, MD as Consulting Physician (Radiation Oncology) Serena Croissant, MD as Consulting Physician (Hematology and Oncology) Emelia Loron, MD as Consulting Physician (General Surgery)  DIAGNOSIS:  Encounter Diagnosis  Name Primary?   Malignant neoplasm of upper-outer quadrant of left breast in female, estrogen receptor negative (HCC) Yes     SUMMARY OF ONCOLOGIC HISTORY: Oncology History  Malignant neoplasm of upper-outer quadrant of left breast in female, estrogen receptor negative (HCC)  11/08/2018 Initial Diagnosis   Routine screening mammogram detected 2 masses in the left breast, measuring 1.6cm and 1.1cm, spanning a total of 3cm. Biopsy confirmed IDC with DCIS, HER-2 negative by FISH (2+ by IHC), ER/PR negative, Ki67 80%.    11/18/2018 Breast MRI   2.9 centimeter bilobed mass in the UOQ of the left breast, consistent with known malignancy. 0.5 centimeter satellite nodule is 0.5 centimeters below this mass.   11/22/2018 Cancer Staging   Staging form: Breast, AJCC 8th Edition - Clinical stage from 11/22/2018: Stage IIB (cT2, cN0, cM0, G3, ER-, PR-, HER2-)    11/27/2018 Genetic Testing   Patient had genetic testing done for a personal and family history of breast cancer.  Results were negative on the [facility] Breast cancer STAT gene panel. The STAT Breast cancer panel offered by [facility] includes sequencing and rearrangement analysis for the following 7 genes:  BRCA1, BRCA2, CDH1, PALB2, PTEN, STK11 and TP53.  The report date is 11/27/2018.   12/01/2018 - 01/25/2019 Chemotherapy   DOXOrubicin (ADRIAMYCIN) chemo injection 114 mg, 60 mg/m2 = 114 mg, Intravenous,  Once, 4 of 4 cycles. Dose modification: 50 mg/m2 (original dose 60 mg/m2, Cycle 2, Reason: Dose not tolerated), 40 mg/m2 (original dose 60 mg/m2, Cycle 4, Reason: Dose not tolerated).  Administration: 114 mg (12/01/2018), 96 mg (12/15/2018), 96 mg (12/29/2018), 76 mg (01/12/2019)  palonosetron (ALOXI) injection 0.25 mg, 0.25 mg, Intravenous,  Once, 5 of 8 cycles. Administration: 0.25 mg (12/01/2018), 0.25 mg (01/25/2019), 0.25 mg (12/15/2018), 0.25 mg (12/29/2018), 0.25 mg (01/12/2019)  pegfilgrastim-jmdb (FULPHILA) injection 6 mg, 6 mg, Subcutaneous,  Once, 4 of 4 cycles. Administration: 6 mg (12/03/2018), 6 mg (12/17/2018), 6 mg (12/31/2018), 6 mg (01/14/2019)  CARBOplatin (PARAPLATIN) 510 mg in sodium chloride 0.9 % 250 mL chemo infusion, 510 mg (106.9 % of original dose 478.8 mg), Intravenous,  Once, 1 of 4 cycles. Dose modification: (original dose 478.8 mg, Cycle 5). Administration: 510 mg (01/25/2019)  cyclophosphamide (CYTOXAN) 1,140 mg in sodium chloride 0.9 % 250 mL chemo infusion, 600 mg/m2 = 1,140 mg, Intravenous,  Once, 4 of 4 cycles. Dose modification: 500 mg/m2 (original dose 600 mg/m2, Cycle 2, Reason: Dose not tolerated), 400 mg/m2 (original dose 600 mg/m2, Cycle 4, Reason: Dose not tolerated). Administration: 1,140 mg (12/01/2018), 960 mg (12/15/2018), 960 mg (12/29/2018), 760 mg (01/12/2019)  PACLitaxel (TAXOL) 150 mg in sodium chloride 0.9 % 250 mL chemo infusion (</= 80mg /m2), 80 mg/m2 = 150 mg, Intravenous,  Once, 1 of 4 cycles Administration: 150 mg (01/25/2019)  fosaprepitant (EMEND) 150 mg   dexamethasone (DECADRON) 12 mg in sodium chloride 0.9 % 145 mL IVPB, , Intravenous,  Once, 5 of 8 cycles Administration:  (12/01/2018),  (01/25/2019),  (12/15/2018),  (12/29/2018),  (01/12/2019)   03/29/2019 Surgery   Left lumpectomy Dwain Sarna) [phone]): scattered microscopic foci of high grade DCIS with no invasive carcinoma, clear margins, 2 left axillary lymph nodes negative.   03/29/2019 Cancer  Staging   Staging form: Breast, AJCC 8th Edition - Pathologic stage from 03/29/2019: No Stage Recommended (ypTis (DCIS), pN0, cM0)    05/03/2019 - 05/31/2019 Radiation Therapy   The  patient initially received a dose of 40.05 Gy in 15 fractions to the breast using whole-breast tangent fields. This was delivered using a 3-D conformal technique. The pt received a boost delivering an additional 10 Gy in 5 fractions using a electron boost with electrons. The total dose was 50.05 Gy.      CHIEF COMPLIANT: Surveillance of breast cancer, complains of memory issues  HISTORY OF PRESENT ILLNESS:   History of Present Illness The patient, a five-year cancer survivor, presents with ongoing short-term memory issues. The memory problems have been present for at least a year and have not significantly improved despite trials of medication, including Aricept. The patient's husband mentions that her family doctor suggested restarting ezetimibe, but her neurologist advised against it. The patient has seen a neurologist for her memory issues, who stated that there is no medication that can help with short-term memory but can treat anxiety, which can affect short-term memory. The patient is interested in non-pharmacological interventions and is open to participating in a clinical trial involving computer games designed to improve memory. The patient's recent mammogram results were good.     ALLERGIES:  is allergic to carboplatin.  MEDICATIONS:  Current Outpatient Medications  Medication Sig Dispense Refill   atorvastatin (LIPITOR) 40 MG tablet Take 1 tablet (40 mg total) by mouth daily. 90 tablet 3   escitalopram (LEXAPRO) 10 MG tablet Take 10 mg by mouth daily.     levothyroxine (SYNTHROID) 75 MCG tablet Take 75 mcg by mouth daily before breakfast.     telmisartan (MICARDIS) 40 MG tablet Take 40 mg by mouth daily.     No current facility-administered medications for this visit.    PHYSICAL EXAMINATION: ECOG PERFORMANCE STATUS: 1 - Symptomatic but completely ambulatory  Vitals:   07/30/23 1345  BP: (!) 110/58  Pulse: 81  Resp: 17  Temp: 98.3 F (36.8 C)  SpO2: 97%   Filed  Weights   07/30/23 1345  Weight: 157 lb 6.4 oz (71.4 kg)    Physical Exam   (exam performed in the presence of a chaperone)  LABORATORY DATA:  I have reviewed the data as listed    Latest Ref Rng & Units 07/06/2022    1:25 AM 07/03/2020   12:41 PM 03/14/2019    9:51 AM  CMP  Glucose 70 - 99 mg/dL 962   85   BUN 8 - 23 mg/dL 28   27   Creatinine 9.52 - 1.00 mg/dL 8.41  3.24  4.01   Sodium 135 - 145 mmol/L 136   142   Potassium 3.5 - 5.1 mmol/L 4.0   4.4   Chloride 98 - 111 mmol/L 103   105   CO2 22 - 32 mmol/L 26   24   Calcium 8.9 - 10.3 mg/dL 8.1   9.2   Total Protein 6.5 - 8.1 g/dL 6.2   7.0   Total Bilirubin 0.3 - 1.2 mg/dL 0.6   0.5   Alkaline Phos 38 - 126 U/L 57   57   AST 15 - 41 U/L 20   16   ALT 0 - 44 U/L 22   14     Lab Results  Component Value Date   WBC 7.2 07/06/2022   HGB 11.3 (L) 07/06/2022   HCT 33.7 (  L) 07/06/2022   MCV 104.3 (H) 07/06/2022   PLT 177 07/06/2022   NEUTROABS 4.9 07/06/2022    ASSESSMENT & PLAN:  Malignant neoplasm of upper-outer quadrant of left breast in female, estrogen receptor negative (HCC) 11/08/2018: Routine screening mammogram detected 2 masses in the left breast, measuring 1.6cm and 1.1cm, spanning a total of 3cm. Biopsy confirmed IDC with DCIS, HER-2 negative by FISH (2+ by IHC), ER/PR negative, Ki67 80%.  Staging: T2N0 stage IIb clinical stage Breast MRI: 11/18/2018: 2.9 cm bilobed mass UOQ left breast   Treatment plan 1.  Neoadjuvant chemotherapy with dose dense Adriamycin and Cytoxan x4 followed by Taxol and carboplatin completed 01/25/2019 2. 05/28/18: Left lumpectomy Dwain Sarna): scattered microscopic foci of high grade DCIS with no invasive carcinoma, clear margins, 2 left axillary lymph nodes negative. 2.  Followed by adjuvant radiation 05/03/2019-05/24/2019 -------------------------------------------------------------------------------------------------- Treatment plan: Surveillance 1. Mammogram and Ultrasound: 11/28/2022:  Benign, breast density category C 2. Breast Exam: 07/30/2023: Benign  They have Havanese dogs and enjoy spending time with them.   Memory issues: Due to chemotherapy: I recommended that she participate in the memory loss clinical trial.   Return to clinic in 1 year for follow-up ------------------------------------- Assessment and Plan Assessment & Plan Short term memory issues Memory issues attributed to chemotherapy. Neurologist advised no medication can improve memory, but anxiety treatment may help. She expressed interest in a clinical trial using computer games for memory improvement. - Enroll in the clinical trial for memory improvement through computer games.  Anxiety Anxiety contributes to memory issues. Neurologist advised against restarting anxiety medication due to potential side effects. She and her husband agreed to not restart medication.      No orders of the defined types were placed in this encounter.  The patient has a good understanding of the overall plan. she agrees with it. she will call with any problems that may develop before the next visit here. Total time spent: 30 mins including face to face time and time spent for planning, charting and co-ordination of care   Tamsen Meek, MD 07/30/23

## 2023-07-30 NOTE — Research (Deleted)
 [id number]: COGNITIVE TRAINING FOR CANCER RELATED COGNITIVE IMPAIRMENT IN BREAST CANCER SURVIVORS: A MULTI-CENTER RANDOMIZED DOUBLE- BLINDED CONTROLLED TRIAL   Patient Carol Byrd was identified by Dr. Pamelia Hoit  as a potential candidate for the above listed study.  This Clinical Research Nurse met with [name] [place], [id number], on 07/30/23 in a manner and location that ensures patient privacy to discuss participation in the above listed research study.  Patient is Accompanied by her husband .  A copy of the informed consent document and separate HIPAA Authorization was provided to the patient.  Patient reads, speaks, and understands [place].   Patient was provided with the business card of this Nurse and encouraged to contact the research team with any questions.  Approximately 20 minutes were spent with the patient reviewing the informed consent documents.  Patient was provided the option of taking informed consent documents home to review and was encouraged to review at their convenience with their support network, including other care providers. Patient took the consent documents home to review.  RN met with pt in clinic exam room to discuss this study. All questions were answered. Rn will call pt on 08-06-23 at 1300 to follow up on participation in this study. Pt and family were thanked for their time and interest in this study. Rn provided a business card for future questions or concerns.   Zerita Boers BSN RN Clinical Research Nurse [facility] Direct Dial: [phone] 07/30/2023  3:13 PM

## 2023-07-30 NOTE — Research (Addendum)
 [id number]-CC011: COGNITIVE TRAINING FOR CANCER RELATED COGNITIVE IMPAIRMENT IN BREAST CANCER SURVIVORS: A MULTI-CENTER RANDOMIZED DOUBLE- BLINDED CONTROLLED TRIAL    Patient Carol Byrd was identified by Dr. Pamelia Hoit  as a potential candidate for the above listed study.  This Clinical Research Nurse met with [name], [id number], on 07/30/23 in a manner and location that ensures patient privacy to discuss participation in the above listed research study.  Patient is Accompanied by her husband .  A copy of the informed consent document and separate HIPAA Authorization was provided to the patient.  Patient reads, speaks, and understands [place].   Patient was provided with the business card of this Nurse and encouraged to contact the research team with any questions.  Approximately 20 minutes were spent with the patient reviewing the informed consent documents.  Patient was provided the option of taking informed consent documents home to review and was encouraged to review at their convenience with their support network, including other care providers. Patient took the consent documents home to review.   RN met with pt in clinic exam room to discuss this study. All questions were answered. Rn will call pt on 08-06-23 at 1300 to follow up on participation in this study. Pt and family were thanked for their time and interest in this study. Rn provided a business card for future questions or concerns.    Spoke with patient and confirmed the following: Patient reads, writes, and understands English. Patient does not have a history of other cancers, except for non-melanoma skin cancer or in situ cervical cancer within the past 5 years. Patient has no history of exposure to chemotherapeutics for another cancer or due to other medical condition (e.g. methotrexate exposure for treatment of RA). Patient has no history of CNS radiation, intrathecal therapy or CNS-involved surgery. Patient has no  history of CVA, TBI, brain surgery, Alzheimer's disease, or other dementia. Patient has no active substance abuse and is not in active treatment for substance abuse. Patient has no history of bipolar disorder, psychosis, schizophrenia, ADHD, or learning disability. Patient is not enrolled in other research trials.  Patient wishes to continue to screening, which was conducted using the NRG-CC011 Screening Script. Patient scored 4 on Assessment #1. Patient scored 5 on Assessment #2. Patient scored 0 on Assessment #3. Based on this screening, patient  is eligible to proceed with consent and enrollment discussions. Confirmed demographic and contact information, including email.   Research RN will continue to check eligibility for this pt.   Zerita Boers BSN RN Clinical Research Nurse [facility] Direct Dial: [phone] 07/30/2023  3:13 PM

## 2023-07-31 ENCOUNTER — Telehealth: Payer: Self-pay | Admitting: Hematology and Oncology

## 2023-07-31 NOTE — Telephone Encounter (Signed)
 Scheduled appointment per 3/13 los. Left VM with appointment details.

## 2023-08-06 ENCOUNTER — Telehealth: Payer: Self-pay

## 2023-08-06 NOTE — Telephone Encounter (Signed)
 [id number]-CC011: COGNITIVE TRAINING FOR CANCER RELATED COGNITIVE IMPAIRMENT IN BREAST CANCER SURVIVORS: A MULTI-CENTER RANDOMIZED DOUBLE- BLINDED CONTROLLED TRIAL   Pt was called today by Research RN to discuss participation in this study. Pt was provided a copy of the informed consent form at a prior visit with Dr. Pamelia Hoit. Pt stated she had was able to read over the consent and does want to participate. Pt will meet with research RN on 08-13-23 at 1300 to sign consent. Pt had no questions or concerns at this time. Pt knows to reach out to research nurse for future concerns or questions.   Zerita Boers BSN RN Clinical Research Nurse [facility] Direct Dial: [phone] 08/06/2023  1:15 PM

## 2023-08-11 ENCOUNTER — Ambulatory Visit: Payer: Medicare Other | Attending: Cardiology | Admitting: Cardiology

## 2023-08-11 ENCOUNTER — Encounter: Payer: Self-pay | Admitting: Cardiology

## 2023-08-11 VITALS — BP 120/70 | HR 71 | Ht 67.0 in | Wt 157.6 lb

## 2023-08-11 DIAGNOSIS — I10 Essential (primary) hypertension: Secondary | ICD-10-CM

## 2023-08-11 DIAGNOSIS — I2510 Atherosclerotic heart disease of native coronary artery without angina pectoris: Secondary | ICD-10-CM | POA: Diagnosis not present

## 2023-08-11 NOTE — Patient Instructions (Signed)
 Medication Instructions:   Your physician recommends that you continue on your current medications as directed. Please refer to the Current Medication list given to you today.   Labwork: None today  Testing/Procedures: Non today  Follow-Up: 1 year  Any Other Special Instructions Will Be Listed Below (If Applicable).  If you need a refill on your cardiac medications before your next appointment, please call your pharmacy.

## 2023-08-11 NOTE — Progress Notes (Signed)
    Cardiology Office Note  Date: 08/11/2023   ID: Carol Byrd, Carol Byrd 1945-06-06, MRN [id number]  History of Present Illness: Carol Byrd is a 78 y.o. female last seen in February 2024.  She is here with her husband for a routine visit.  Reports no exertional chest pain or unusual shortness of breath with typical activities, no change in stamina.  No palpitations or syncope.  We went over her medications.  She reports compliance with current regimen and no obvious side effects.  I did review her interval lab work which is noted below.  I reviewed her ECG today which shows sinus rhythm with PACs.  Physical Exam: VS:  BP 120/70   Pulse 71   Ht 5\' 7"  (1.702 m)   Wt 157 lb 9.6 oz (71.5 kg)   SpO2 96%   BMI 24.68 kg/m , BMI Body mass index is 24.68 kg/m.  Wt Readings from Last 3 Encounters:  08/11/23 157 lb 9.6 oz (71.5 kg)  07/30/23 157 lb 6.4 oz (71.4 kg)  08/14/22 153 lb (69.4 kg)    General: Patient appears comfortable at rest. HEENT: Conjunctiva and lids normal. Neck: Supple, no elevated JVP or carotid bruits. Lungs: Clear to auscultation, nonlabored breathing at rest. Cardiac: Regular rate and rhythm, no S3 or significant systolic murmur, no pericardial rub.  ECG:  An ECG dated 07/06/2022 was personally reviewed today and demonstrated:  Sinus rhythm with R' in lead V1 and V2.  Labwork:  February 2025: Hemoglobin 13.5, platelets 245, BUN 29, creatinine 1.23, GFR 45, potassium 4.8, AST 21, ALT 19, cholesterol 181, triglycerides 107, HDL 74, LDL 88, TSH 4, hemoglobin W0J 5.7%  Other Studies Reviewed Today:  No interval cardiac testing for review today.  Assessment and Plan:  1.  Coronary artery calcification by CT imaging, also aortic atherosclerosis.  Cardiac structural and ischemic workup from 2022 was overall reassuring.  She remains asymptomatic, ECG reviewed today.  Would continue observation on medical therapy aimed at risk factor reduction.   Continue Lipitor 40 mg daily.   2.  Primary hypertension.  Blood pressure is well-controlled today.  Continue Micardis 40 mg daily and keep follow-up with Dr. Ouida Sills.  Disposition:  Follow up  1 year.  Signed, Jonelle Sidle, M.D., F.A.C.C.  [facility]

## 2023-08-13 ENCOUNTER — Inpatient Hospital Stay

## 2023-08-13 DIAGNOSIS — C50412 Malignant neoplasm of upper-outer quadrant of left female breast: Secondary | ICD-10-CM

## 2023-08-13 DIAGNOSIS — Z171 Estrogen receptor negative status [ER-]: Secondary | ICD-10-CM

## 2023-08-13 NOTE — Research (Signed)
 NRG-CC011: COGNITIVE TRAINING FOR CANCER RELATED COGNITIVE IMPAIRMENT IN BREAST CANCER SURVIVORS: A MULTI-CENTER RANDOMIZED DOUBLE- BLINDED CONTROLLED TRIAL   This Nurse has reviewed this patient's inclusion and exclusion criteria as a second review and confirms Chiyeko Ferre is eligible for study participation.  Patient may continue with enrollment.  Domenica Reamer, BSN, RN, [employer] Research Nurse II [phone] 08/13/2023

## 2023-08-13 NOTE — Research (Signed)
 NRG-CC011: COGNITIVE TRAINING FOR CANCER RELATED COGNITIVE IMPAIRMENT IN BREAST CANCER SURVIVORS: A MULTI-CENTER RANDOMIZED DOUBLE- BLINDED CONTROLLED TRIAL   Patient Carol Byrd was identified by Dr. Pamelia Hoit as a potential candidate for the above listed study.  This Clinical Research Nurse met with Carol Byrd, [id number] on 08/13/23 in a manner and location that ensures patient privacy to discuss participation in the above listed research study.  Patient is Accompanied by her husband .  Patient was previously provided with informed consent documents.  Patient confirmed they have read the informed consent documents.  As outlined in the informed consent form, this Nurse and Carol Byrd discussed the purpose of the research study, the investigational nature of the study, study procedures and requirements for study participation, potential risks and benefits of study participation, as well as alternatives to participation.  This study is not blinded or double-blinded. The patient understands participation is voluntary and they may withdraw from study participation at any time.  Each study arm was reviewed, and randomization discussed.  This study does not involve an investigational drug or device. This study does not involve a placebo. Patient understands enrollment is pending full eligibility review.   Confidentiality and how the patient's information will be used as part of study participation were discussed.  Patient was informed there is reimbursement provided for their time and effort spent on trial participation.  The patient is encouraged to discuss research study participation with their insurance provider to determine what costs they may incur as part of study participation, including research related injury.    All questions were answered to patient's satisfaction.  The informed consent and separate HIPAA Authorization was reviewed page by page.  The patient's  mental and emotional status is appropriate to provide informed consent, and the patient verbalizes an understanding of study participation.  Patient has agreed to participate in the above listed research study and has voluntarily signed the informed consent version date 04-13-23 and separate HIPAA Authorization, version [place] IRB version 02-16-23  on 08/13/23 at 1:05PM.  The patient was provided with a copy of the signed informed consent form and separate HIPAA Authorization for their reference.  No study specific procedures were obtained prior to the signing of the informed consent document.  Approximately 15 minutes were spent with the patient reviewing the informed consent documents.  Patient was not requested to complete a Release of Information form.  This Nurse has reviewed this patient's inclusion and exclusion criteria and confirmed Carol Byrd is eligible for study participation.  Patient will continue with enrollment.  Eligibility confirmed by treating investigator, who also agrees that patient should proceed with enrollment.  Spoke with patient and confirmed the following: Patient reads, writes, and understands English. Patient does not have a history of other cancers, except for non-melanoma skin cancer or in situ cervical cancer within the past 5 years. Patient has no history of exposure to chemotherapeutics for another cancer or due to other medical condition (e.g. methotrexate exposure for treatment of RA). Patient has no history of CNS radiation, intrathecal therapy or CNS-involved surgery. Patient has no history of CVA, TBI, brain surgery, Alzheimer's disease, or other dementia. Patient has no active substance abuse and is not in active treatment for substance abuse. Patient has no history of bipolar disorder, psychosis, schizophrenia, ADHD, or learning disability. Patient is not enrolled in other research trials.  Patient wishes to continue to screening, which was  conducted using the NRG-CC011 Screening Script. Patient scored 4 on  Assessment #1. Patient scored 5 on Assessment #2. Patient scored 0 on Assessment #3. Based on this screening, patient is eligible to proceed with consent and enrollment discussions. Confirmed demographic and contact information, including email.   Zerita Boers BSN RN Clinical Research Nurse [facility] Direct Dial: [phone] 08/13/2023  1:54 PM

## 2023-08-13 NOTE — Research (Signed)
 DCP-001: Use of a Clinical Trial Screening Tool to Address Cancer Health Disparities in the [facility]cology Research Program [facility])  Patient Carol Byrd was identified by Dr. Pamelia Hoit as a potential candidate for the above listed study.  This Clinical Research Nurse met with Carol Byrd, [id number] on 08/13/23 in a manner and location that ensures patient privacy to discuss participation in the above listed research study.  Patient is Accompanied by her husband . A copy of the informed consent document and separate HIPAA Authorization was provided to the patient.  Patient reads, speaks, and understands [place]. Patient was previously provided with informed consent documents.  Patient has not yet read the informed consent documents and so documents were reviewed page by page today.  Patient was provided with the business card of this Nurse and encouraged to contact the research team with any questions.  Patient was provided the option of taking informed consent documents home to review and was encouraged to review at their convenience with their support network, including other care providers. Patient is comfortable with making a decision regarding study participation today.  As outlined in the informed consent form, this Nurse and Carol Byrd discussed the purpose of the research study, the investigational nature of the study, study procedures and requirements for study participation, potential risks and benefits of study participation, as well as alternatives to participation.   The patient understands participation is voluntary and they may withdraw from study participation at any time. Patient understands enrollment is pending full eligibility review.   Confidentiality and how the patient's information will be used as part of study participation were discussed.  Patient was informed there is not reimbursement provided for their time and effort spent on trial  participation.    All questions were answered to patient's satisfaction.  The informed consent and separate HIPAA Authorization was reviewed page by page.  The patient's mental and emotional status is appropriate to provide informed consent, and the patient verbalizes an understanding of study participation.  Patient has agreed to participate in the above listed research study and has voluntarily signed the informed consent version date was 06-17-21 and separate HIPAA Authorization, version [place] IRB approval date 10-27-22  on 08/13/23 at 1:10PM.  The patient was provided with a copy of the signed informed consent form and separate HIPAA Authorization for their reference.  No study specific procedures were obtained prior to the signing of the informed consent document.  Approximately 20 minutes were spent with the patient reviewing the informed consent documents.  Patient was not requested to complete a Release of Information form.  This Nurse has reviewed this patient's inclusion and exclusion criteria and confirmed Carol Byrd is eligible for study participation.  Patient will continue with enrollment.  Eligibility confirmed by treating investigator, who also agrees that patient should proceed with enrollment.  Pt and family know to reach out with any questions or concerns. Pt was thanked for her participation in this study.   Carol Byrd BSN RN Clinical Research Nurse [facility] Direct Dial: [phone] 08/13/2023  1:46 PM

## 2023-08-29 ENCOUNTER — Other Ambulatory Visit (HOSPITAL_COMMUNITY): Payer: Self-pay | Admitting: Internal Medicine

## 2023-08-29 DIAGNOSIS — Z1231 Encounter for screening mammogram for malignant neoplasm of breast: Secondary | ICD-10-CM

## 2023-09-03 ENCOUNTER — Telehealth: Payer: Self-pay

## 2023-09-03 NOTE — Telephone Encounter (Signed)
 NRG-CC011: COGNITIVE TRAINING FOR CANCER RELATED COGNITIVE IMPAIRMENT IN BREAST CANCER SURVIVORS: A MULTI-CENTER RANDOMIZED DOUBLE- BLINDED CONTROLLED TRIAL   Research RN attempted to call pt to seek update on pt contact with study. Rn was unable to reach pt and left a message for pt to return call. Rn will await call back from pt.   Ramonia Burns BSN RN Clinical Research Nurse [facility] Direct Dial: [phone] 09/03/2023  9:17 AM

## 2023-09-24 DIAGNOSIS — I700 Atherosclerosis of aorta: Secondary | ICD-10-CM | POA: Diagnosis not present

## 2023-09-24 DIAGNOSIS — N1831 Chronic kidney disease, stage 3a: Secondary | ICD-10-CM | POA: Diagnosis not present

## 2023-09-24 DIAGNOSIS — I10 Essential (primary) hypertension: Secondary | ICD-10-CM | POA: Diagnosis not present

## 2023-09-24 DIAGNOSIS — R413 Other amnesia: Secondary | ICD-10-CM | POA: Diagnosis not present

## 2023-09-24 DIAGNOSIS — R7301 Impaired fasting glucose: Secondary | ICD-10-CM | POA: Diagnosis not present

## 2023-10-01 DIAGNOSIS — N1831 Chronic kidney disease, stage 3a: Secondary | ICD-10-CM | POA: Diagnosis not present

## 2023-10-01 DIAGNOSIS — R413 Other amnesia: Secondary | ICD-10-CM | POA: Diagnosis not present

## 2023-10-20 DIAGNOSIS — Z91014 Allergy to mammalian meats: Secondary | ICD-10-CM | POA: Diagnosis not present

## 2023-12-04 ENCOUNTER — Ambulatory Visit (HOSPITAL_COMMUNITY)
Admission: RE | Admit: 2023-12-04 | Discharge: 2023-12-04 | Disposition: A | Discharge status: Home / Self Care | Source: Ambulatory Visit | Attending: Internal Medicine | Admitting: Internal Medicine

## 2023-12-04 DIAGNOSIS — Z1231 Encounter for screening mammogram for malignant neoplasm of breast: Secondary | ICD-10-CM | POA: Insufficient documentation

## 2023-12-08 ENCOUNTER — Encounter: Payer: Self-pay | Admitting: Hematology and Oncology

## 2023-12-09 ENCOUNTER — Ambulatory Visit: Payer: Self-pay | Admitting: *Deleted

## 2023-12-15 DIAGNOSIS — Z808 Family history of malignant neoplasm of other organs or systems: Secondary | ICD-10-CM | POA: Diagnosis not present

## 2023-12-15 DIAGNOSIS — L658 Other specified nonscarring hair loss: Secondary | ICD-10-CM | POA: Diagnosis not present

## 2023-12-15 DIAGNOSIS — L821 Other seborrheic keratosis: Secondary | ICD-10-CM | POA: Diagnosis not present

## 2023-12-15 DIAGNOSIS — L853 Xerosis cutis: Secondary | ICD-10-CM | POA: Diagnosis not present

## 2023-12-15 DIAGNOSIS — D1801 Hemangioma of skin and subcutaneous tissue: Secondary | ICD-10-CM | POA: Diagnosis not present

## 2023-12-15 DIAGNOSIS — L720 Epidermal cyst: Secondary | ICD-10-CM | POA: Diagnosis not present

## 2023-12-30 DIAGNOSIS — Z79899 Other long term (current) drug therapy: Secondary | ICD-10-CM | POA: Diagnosis not present

## 2023-12-30 DIAGNOSIS — N1831 Chronic kidney disease, stage 3a: Secondary | ICD-10-CM | POA: Diagnosis not present

## 2023-12-30 DIAGNOSIS — I700 Atherosclerosis of aorta: Secondary | ICD-10-CM | POA: Diagnosis not present

## 2024-01-04 DIAGNOSIS — I10 Essential (primary) hypertension: Secondary | ICD-10-CM | POA: Diagnosis not present

## 2024-01-04 DIAGNOSIS — R413 Other amnesia: Secondary | ICD-10-CM | POA: Diagnosis not present

## 2024-03-02 ENCOUNTER — Encounter (INDEPENDENT_AMBULATORY_CARE_PROVIDER_SITE_OTHER): Payer: Self-pay | Admitting: Gastroenterology

## 2024-03-20 ENCOUNTER — Other Ambulatory Visit: Payer: Self-pay

## 2024-03-20 ENCOUNTER — Emergency Department (HOSPITAL_COMMUNITY)
Admission: EM | Admit: 2024-03-20 | Discharge: 2024-03-20 | Discharge status: Against Medical Advice | Attending: Emergency Medicine | Admitting: Emergency Medicine

## 2024-03-20 ENCOUNTER — Emergency Department (HOSPITAL_COMMUNITY)

## 2024-03-20 DIAGNOSIS — I10 Essential (primary) hypertension: Secondary | ICD-10-CM | POA: Insufficient documentation

## 2024-03-20 DIAGNOSIS — R569 Unspecified convulsions: Secondary | ICD-10-CM | POA: Diagnosis present

## 2024-03-20 DIAGNOSIS — Z79899 Other long term (current) drug therapy: Secondary | ICD-10-CM | POA: Insufficient documentation

## 2024-03-20 DIAGNOSIS — R55 Syncope and collapse: Secondary | ICD-10-CM

## 2024-03-20 DIAGNOSIS — Z5329 Procedure and treatment not carried out because of patient's decision for other reasons: Secondary | ICD-10-CM | POA: Insufficient documentation

## 2024-03-20 LAB — COMPREHENSIVE METABOLIC PANEL WITH GFR
ALT: 26 U/L (ref 0–44)
AST: 25 U/L (ref 15–41)
Albumin: 4.3 g/dL (ref 3.5–5.0)
Alkaline Phosphatase: 52 U/L (ref 38–126)
Anion gap: 11 (ref 5–15)
BUN: 29 mg/dL — ABNORMAL HIGH (ref 8–23)
CO2: 27 mmol/L (ref 22–32)
Calcium: 8.6 mg/dL — ABNORMAL LOW (ref 8.9–10.3)
Chloride: 102 mmol/L (ref 98–111)
Creatinine, Ser: 1.26 mg/dL — ABNORMAL HIGH (ref 0.44–1.00)
GFR, Estimated: 43 mL/min — ABNORMAL LOW (ref >60)
Glucose, Bld: 118 mg/dL — ABNORMAL HIGH (ref 70–99)
Potassium: 4.1 mmol/L (ref 3.5–5.1)
Sodium: 140 mmol/L (ref 135–145)
Total Bilirubin: 0.9 mg/dL (ref 0.0–1.2)
Total Protein: 6.7 g/dL (ref 6.5–8.1)

## 2024-03-20 LAB — CBC
HCT: 36.8 % (ref 36.0–46.0)
Hemoglobin: 12.1 g/dL (ref 12.0–15.0)
MCH: 34.6 pg — ABNORMAL HIGH (ref 26.0–34.0)
MCHC: 32.9 g/dL (ref 30.0–36.0)
MCV: 105.1 fL — ABNORMAL HIGH (ref 80.0–100.0)
Platelets: 207 K/uL (ref 150–400)
RBC: 3.5 MIL/uL — ABNORMAL LOW (ref 3.87–5.11)
RDW: 12.6 % (ref 11.5–15.5)
WBC: 4.8 K/uL (ref 4.0–10.5)
nRBC: 0 % (ref 0.0–0.2)

## 2024-03-20 LAB — CBG MONITORING, ED: Glucose-Capillary: 105 mg/dL — ABNORMAL HIGH (ref 70–99)

## 2024-03-20 MED ORDER — SODIUM CHLORIDE 0.9 % IV BOLUS: 1000.0000 mL | Freq: Once | INTRAVENOUS | Status: DC

## 2024-03-20 NOTE — Progress Notes (Signed)
Refused CT scan

## 2024-03-20 NOTE — ED Triage Notes (Signed)
 Pt husband found pt on the kitchen floor after her heard a loud noise. Pt does not remember fall , she just remembers being at the kitchen sink and then her husband helping her up. Husband states pt was not alert, sweating and took her a few minutes to get back to normal. Pt reports no injury or pain at this time. Pt denies any head injuries noted to her head. Pt denies blood thinners. Cbg 105 in triage.

## 2024-03-20 NOTE — ED Notes (Signed)
 Lab at bedside

## 2024-03-20 NOTE — ED Notes (Signed)
 Pt and family sent radiology away. Declined head CT and IV fluids.

## 2024-03-20 NOTE — ED Notes (Signed)
 Pt has signed AMA form and elected to leave instead of ordered tests.

## 2024-03-22 NOTE — ED Provider Notes (Signed)
 [place] EMERGENCY DEPARTMENT AT [facility] Provider Note   CSN: [id number] Arrival date & time: 03/20/24  1100     Patient presents with: Loss of Consciousness   Carol Byrd is a 78 y.o. female.   Husband      Pt comes in with cc of LOC. Accompanied by dad patient has no cardiac disease history.  Husband found patient on the kitchen floor after he heard a loud noise.  Patient stated that she had no warning at all of feeling like she was going to faint.  She denies any chest pain, palpitations, shortness of breath.  Husband noted that patient was slightly pale appearing.  Thereafter, within the span of 5 to 10 minutes as he was trying to help her get to the couch, patient had 2 other episodes of fainting.  In total patient fainted 3 times, and then slowly came to.  She did not have any seizure-like activity and there was no incontinence or tongue biting.  Patient has no previous history of similar symptoms. She has followed up with cardiology locally.  Prior to Admission medications   Medication Sig Start Date End Date Taking? Authorizing Provider  atorvastatin  (LIPITOR) 40 MG tablet Take 1 tablet (40 mg total) by mouth daily. Patient taking differently: Take 40 mg by mouth every evening. 07/11/22 03/20/24 Yes Debera Jayson MATSU, MD  donepezil  (ARICEPT ) 10 MG tablet Take 10 mg by mouth at bedtime. 01/25/24  Yes [provider]  doxylamine, Sleep, (UNISOM) 25 MG tablet Take 12.5 mg by mouth at bedtime as needed for sleep.   Yes [provider]  ezetimibe (ZETIA) 10 MG tablet Take 10 mg by mouth daily. 01/25/24  Yes [provider]  levothyroxine (SYNTHROID) 75 MCG tablet Take 75 mcg by mouth daily before breakfast. 04/11/19  Yes [provider]  telmisartan (MICARDIS) 40 MG tablet Take 40 mg by mouth daily. 08/31/18  Yes [provider]  prochlorperazine  (COMPAZINE ) 10 MG tablet TAKE ONE TABLET (10MG  TOTAL) BY MOUTH EVERY  SIX HOURS AS NEEDED FOR NAUSEA OR VOMITING 01/17/19 02/23/19  Odean Potts, MD    Allergies: Carboplatin     Review of Systems  All other systems reviewed and are negative.   Updated Vital Signs BP 105/65   Pulse 63   Temp 98.2 F (36.8 C) (Oral)   Resp 14   Ht 5' 7 (1.702 m)   Wt 68 kg   SpO2 98%   BMI 23.49 kg/m   Physical Exam Vitals and nursing note reviewed.  Constitutional:      Appearance: She is well-developed.  HENT:     Head: Atraumatic.  Eyes:     Extraocular Movements: Extraocular movements intact.     Pupils: Pupils are equal, round, and reactive to light.  Cardiovascular:     Rate and Rhythm: Normal rate.  Pulmonary:     Effort: Pulmonary effort is normal.  Abdominal:     Tenderness: There is no abdominal tenderness.  Musculoskeletal:     Cervical back: Normal range of motion and neck supple.  Skin:    General: Skin is warm and dry.  Neurological:     Mental Status: She is alert and oriented to person, place, and time.     (all labs ordered are listed, but only abnormal results are displayed) Labs Reviewed  COMPREHENSIVE METABOLIC PANEL WITH GFR - Abnormal; Notable for the following components:      Result Value   Glucose, Bld 118 (*)  BUN 29 (*)    Creatinine, Ser 1.26 (*)    Calcium  8.6 (*)    GFR, Estimated 43 (*)    All other components within normal limits  CBC - Abnormal; Notable for the following components:   RBC 3.50 (*)    MCV 105.1 (*)    MCH 34.6 (*)    All other components within normal limits  CBG MONITORING, ED - Abnormal; Notable for the following components:   Glucose-Capillary 105 (*)    All other components within normal limits  CBG MONITORING, ED    EKG: EKG Interpretation Date/Time:  Sunday March 20 2024 11:28:42 EST Ventricular Rate:  73 PR Interval:  158 QRS Duration:  80 QT Interval:  402 QTC Calculation: 442 R Axis:   57  Text Interpretation: Normal sinus rhythm Normal ECG When compared with ECG of  11-Aug-2023 14:14, Premature atrial complexes are no longer Present T wave amplitude has decreased in Inferior leads Nonspecific T wave abnormality now evident in Lateral leads Confirmed by Charlyn Sora [phone]) on 03/20/2024 2:31:24 PM  Radiology: No results found.   Procedures   Medications Ordered in the ED - No data to display                                  Medical Decision Making Amount and/or Complexity of Data Reviewed Labs: ordered.   78 year old patient with pertinent past medical history of htn comes in with chief complaint of syncope.  Collateral history provided by husband. I have also reviewed patient's previous records including the medications.   Differential diagnosis considered for this patient includes: Orthostatic hypotension, Vasovagal/neurocardiogenic syncope Stroke, Vertebral artery dissection/stenosis, complex partial seizure Dysrhythmia, severe aortic stenosis, cardiomyopathy, pulmonary embolism Anemia  Based on initial evaluation, plan is to keep patient on telemetry for 4 hours and then d/c with outpatient Cards f/u. I did discuss with the patient that we get concerned about patient having syncopal episode without any prodrome, is concerning arrhythmia as etiology.  But patient was already not thrilled about being admitted to the hospital.  Ultimately she has agreed to telemetry monitoring in the ER and then we will revisit the topic.  From trauma perspective, appropriate imaging has been ordered.   Reassessment: I was advised by the nursing staff that patient did not want to get CT scan of the brain.  She had a fall, struck her head, therefore CT head was ordered. I had gone to check on her and discuss the rationale, but there was nobody in the room.  Patient allegedly had left AMA.  He did not have the opportunity conversation with her about follow-up, admission, return precautions.  Final diagnoses:  Syncope and collapse    ED Discharge Orders      None          Charlyn Sora, MD 03/22/24 [phone]

## 2024-03-29 ENCOUNTER — Ambulatory Visit: Attending: Cardiology

## 2024-03-29 ENCOUNTER — Telehealth: Payer: Self-pay | Admitting: *Deleted

## 2024-03-29 DIAGNOSIS — R55 Syncope and collapse: Secondary | ICD-10-CM

## 2024-03-29 NOTE — Telephone Encounter (Signed)
 Fax received for 7 day Zio patch for syncope from Dr. Sheryle.

## 2024-04-19 ENCOUNTER — Ambulatory Visit: Payer: Self-pay | Admitting: Cardiology

## 2024-04-19 DIAGNOSIS — R55 Syncope and collapse: Secondary | ICD-10-CM

## 2024-04-22 ENCOUNTER — Ambulatory Visit (HOSPITAL_COMMUNITY)
Admission: RE | Admit: 2024-04-22 | Discharge: 2024-04-22 | Disposition: A | Source: Ambulatory Visit | Attending: Internal Medicine | Admitting: Internal Medicine

## 2024-04-22 ENCOUNTER — Other Ambulatory Visit (HOSPITAL_COMMUNITY): Payer: Self-pay | Admitting: Internal Medicine

## 2024-04-22 DIAGNOSIS — M25552 Pain in left hip: Secondary | ICD-10-CM

## 2024-07-12 ENCOUNTER — Ambulatory Visit: Admitting: Cardiology

## 2024-08-01 ENCOUNTER — Ambulatory Visit: Admitting: Hematology and Oncology
